# Patient Record
Sex: Male | Born: 1935 | Race: White | Hispanic: No | Marital: Married | Smoking: Former smoker
Health system: Southern US, Community
[De-identification: ages and names within clinical notes are randomized; demographics above are authoritative.]

## PROBLEM LIST (undated history)

## (undated) DIAGNOSIS — K219 Gastro-esophageal reflux disease without esophagitis: Secondary | ICD-10-CM

## (undated) DIAGNOSIS — I10 Essential (primary) hypertension: Secondary | ICD-10-CM

## (undated) DIAGNOSIS — M722 Plantar fascial fibromatosis: Secondary | ICD-10-CM

## (undated) DIAGNOSIS — M479 Spondylosis, unspecified: Secondary | ICD-10-CM

## (undated) DIAGNOSIS — R7989 Other specified abnormal findings of blood chemistry: Secondary | ICD-10-CM

## (undated) DIAGNOSIS — I350 Nonrheumatic aortic (valve) stenosis: Secondary | ICD-10-CM

## (undated) HISTORY — DX: Gastro-esophageal reflux disease without esophagitis: K21.9

## (undated) HISTORY — PX: TONSILLECTOMY: SHX5217

## (undated) HISTORY — PX: OTHER SURGICAL HISTORY: SHX169

## (undated) HISTORY — DX: Other specified abnormal findings of blood chemistry: R79.89

## (undated) HISTORY — DX: Nonrheumatic aortic (valve) stenosis: I35.0

## (undated) HISTORY — DX: Plantar fascial fibromatosis: M72.2

## (undated) HISTORY — DX: Essential (primary) hypertension: I10

## (undated) HISTORY — DX: Spondylosis, unspecified: M47.9

---

## 2001-04-28 ENCOUNTER — Encounter: Payer: Self-pay | Admitting: Internal Medicine

## 2001-04-28 ENCOUNTER — Ambulatory Visit (HOSPITAL_COMMUNITY): Admission: RE | Admit: 2001-04-28 | Discharge: 2001-04-28 | Payer: Self-pay | Admitting: Internal Medicine

## 2004-03-05 HISTORY — PX: CATARACT EXTRACTION: SUR2

## 2004-03-05 HISTORY — PX: BLEPHAROPLASTY: SUR158

## 2004-10-02 ENCOUNTER — Ambulatory Visit: Payer: Self-pay | Admitting: Internal Medicine

## 2004-10-03 ENCOUNTER — Ambulatory Visit: Payer: Self-pay | Admitting: Internal Medicine

## 2005-02-23 ENCOUNTER — Ambulatory Visit (HOSPITAL_COMMUNITY): Admission: RE | Admit: 2005-02-23 | Discharge: 2005-02-23 | Payer: Self-pay | Admitting: Orthopedic Surgery

## 2005-03-05 HISTORY — PX: ROTATOR CUFF REPAIR: SHX139

## 2005-03-28 ENCOUNTER — Encounter: Admission: RE | Admit: 2005-03-28 | Discharge: 2005-03-28 | Payer: Self-pay | Admitting: Orthopedic Surgery

## 2005-03-29 ENCOUNTER — Ambulatory Visit (HOSPITAL_BASED_OUTPATIENT_CLINIC_OR_DEPARTMENT_OTHER): Admission: RE | Admit: 2005-03-29 | Discharge: 2005-03-30 | Payer: Self-pay | Admitting: Orthopedic Surgery

## 2005-03-29 ENCOUNTER — Encounter (INDEPENDENT_AMBULATORY_CARE_PROVIDER_SITE_OTHER): Payer: Self-pay | Admitting: Specialist

## 2005-12-13 ENCOUNTER — Ambulatory Visit: Payer: Self-pay | Admitting: Internal Medicine

## 2006-04-27 IMAGING — CR DG CHEST 2V
2 series · 2 of 2 positions shown · non-contrast
Comparison: none

CLINICAL DATA: Pre-op for shoulder surgery impingement. 
 CHEST X-RAY: 
 Two views of the chest show the lungs to be clear and slightly hyperaerated.  The heart is within normal limits in size.  There is a thoracic scoliosis present.  The descending thoracic aorta is ectatic.

[view not recorded (1 of 2)]
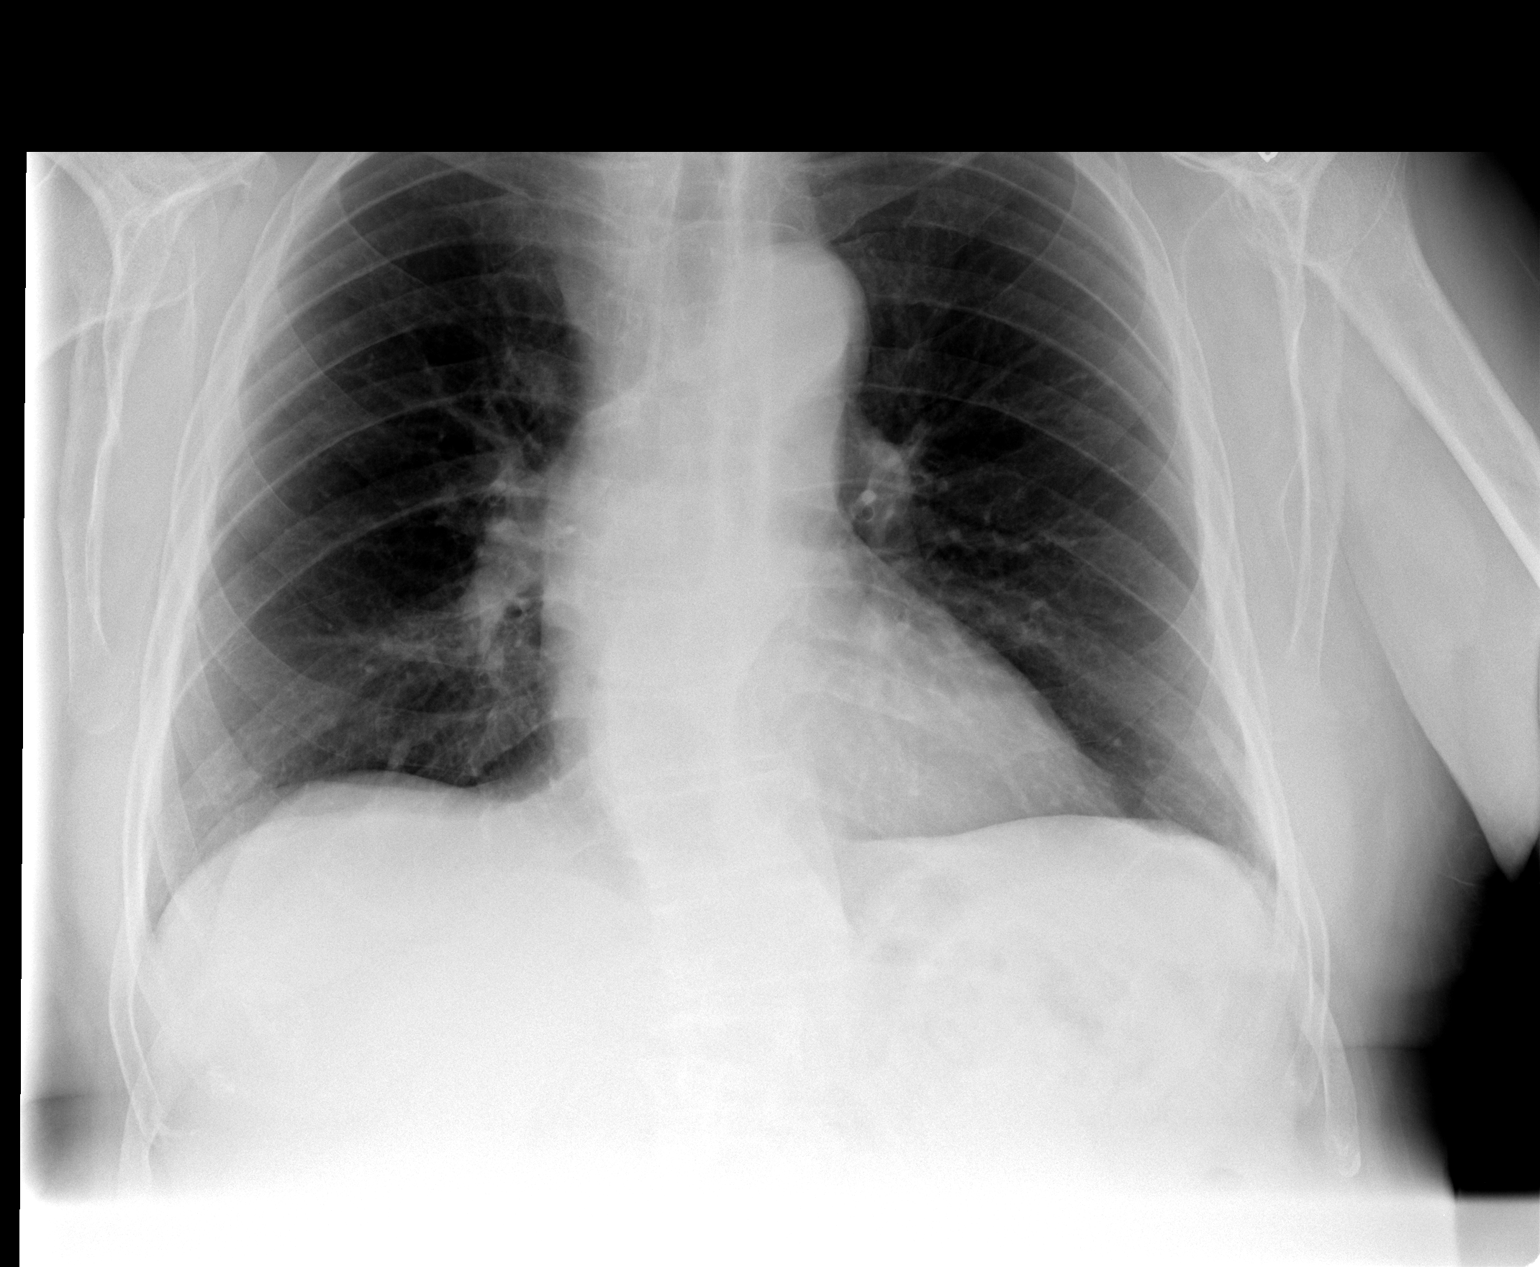

[view not recorded (2 of 2)]
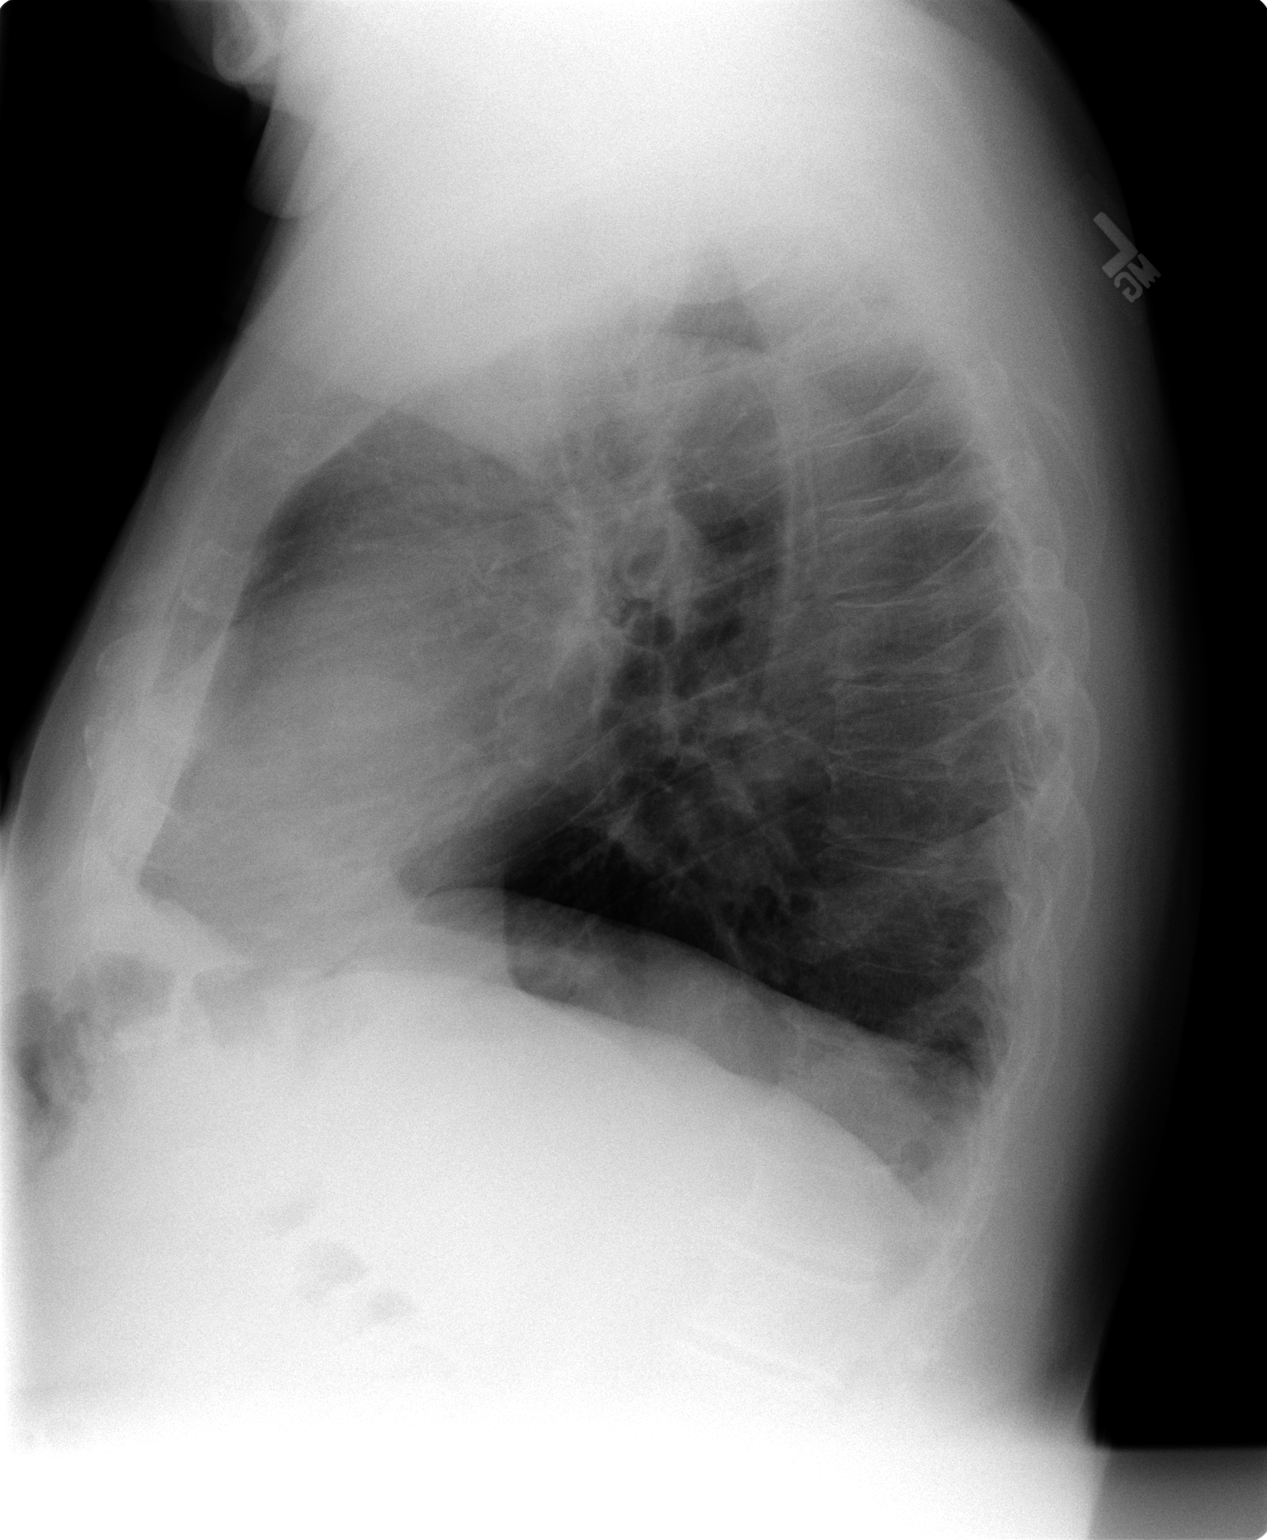

[2 of 2 positions shown; findings below may reference images not displayed]

IMPRESSION: No active lung disease.

## 2007-01-06 ENCOUNTER — Encounter: Payer: Self-pay | Admitting: Internal Medicine

## 2007-01-06 DIAGNOSIS — M479 Spondylosis, unspecified: Secondary | ICD-10-CM

## 2007-01-06 DIAGNOSIS — M722 Plantar fascial fibromatosis: Secondary | ICD-10-CM

## 2007-01-06 DIAGNOSIS — I10 Essential (primary) hypertension: Secondary | ICD-10-CM

## 2007-01-06 HISTORY — DX: Plantar fascial fibromatosis: M72.2

## 2007-01-06 HISTORY — DX: Spondylosis, unspecified: M47.9

## 2007-01-06 HISTORY — DX: Essential (primary) hypertension: I10

## 2007-02-03 ENCOUNTER — Ambulatory Visit: Payer: Self-pay | Admitting: Internal Medicine

## 2007-02-03 DIAGNOSIS — R7989 Other specified abnormal findings of blood chemistry: Secondary | ICD-10-CM | POA: Insufficient documentation

## 2007-02-03 HISTORY — DX: Other specified abnormal findings of blood chemistry: R79.89

## 2007-02-03 LAB — CONVERTED CEMR LAB
BUN: 21 mg/dL (ref 6–23)
CO2: 30 meq/L (ref 19–32)
Calcium: 9.6 mg/dL (ref 8.4–10.5)
Chloride: 103 meq/L (ref 96–112)
Cholesterol: 193 mg/dL (ref 0–200)
Creatinine, Ser: 0.9 mg/dL (ref 0.4–1.5)
GFR calc Af Amer: 105 mL/min
GFR calc non Af Amer: 87 mL/min
Glucose, Bld: 118 mg/dL — ABNORMAL HIGH (ref 70–99)
HDL: 32.9 mg/dL — ABNORMAL LOW (ref >39.0)
LDL Cholesterol: 142 mg/dL — ABNORMAL HIGH (ref 0–99)
PSA: 1.75 ng/mL (ref 0.10–4.00)
Potassium: 4.1 meq/L (ref 3.5–5.1)
Sodium: 141 meq/L (ref 135–145)
Total CHOL/HDL Ratio: 5.9
Triglycerides: 92 mg/dL (ref 0–149)
VLDL: 18 mg/dL (ref 0–40)

## 2007-02-04 ENCOUNTER — Encounter: Payer: Self-pay | Admitting: Internal Medicine

## 2007-02-05 ENCOUNTER — Encounter: Payer: Self-pay | Admitting: Internal Medicine

## 2008-03-11 ENCOUNTER — Ambulatory Visit: Payer: Self-pay | Admitting: Internal Medicine

## 2008-03-11 LAB — CONVERTED CEMR LAB
BUN: 22 mg/dL (ref 6–23)
Basophils Absolute: 0.1 10*3/uL (ref 0.0–0.1)
Basophils Relative: 1.4 % (ref 0.0–3.0)
CO2: 27 meq/L (ref 19–32)
Calcium: 9.8 mg/dL (ref 8.4–10.5)
Chloride: 99 meq/L (ref 96–112)
Creatinine, Ser: 1 mg/dL (ref 0.4–1.5)
Eosinophils Absolute: 0.2 10*3/uL (ref 0.0–0.7)
Eosinophils Relative: 2.9 % (ref 0.0–5.0)
GFR calc Af Amer: 92 mL/min
GFR calc non Af Amer: 76 mL/min
Glucose, Bld: 123 mg/dL — ABNORMAL HIGH (ref 70–99)
HCT: 44.3 % (ref 39.0–52.0)
Hemoglobin: 15.1 g/dL (ref 13.0–17.0)
Lymphocytes Relative: 17.8 % (ref 12.0–46.0)
MCHC: 34 g/dL (ref 30.0–36.0)
MCV: 93.9 fL (ref 78.0–100.0)
Monocytes Absolute: 0.5 10*3/uL (ref 0.1–1.0)
Monocytes Relative: 7.3 % (ref 3.0–12.0)
Neutro Abs: 4.9 10*3/uL (ref 1.4–7.7)
Neutrophils Relative %: 70.6 % (ref 43.0–77.0)
Platelets: 199 10*3/uL (ref 150–400)
Potassium: 3.9 meq/L (ref 3.5–5.1)
RBC: 4.72 M/uL (ref 4.22–5.81)
RDW: 12.4 % (ref 11.5–14.6)
Sodium: 138 meq/L (ref 135–145)
WBC: 6.9 10*3/uL (ref 4.5–10.5)

## 2008-03-16 ENCOUNTER — Ambulatory Visit: Payer: Self-pay | Admitting: Internal Medicine

## 2008-12-01 ENCOUNTER — Ambulatory Visit: Payer: Self-pay | Admitting: Internal Medicine

## 2009-02-21 ENCOUNTER — Telehealth: Payer: Self-pay | Admitting: Internal Medicine

## 2009-04-04 ENCOUNTER — Ambulatory Visit: Payer: Self-pay | Admitting: Internal Medicine

## 2009-04-04 LAB — CONVERTED CEMR LAB
BUN: 19 mg/dL (ref 6–23)
Basophils Absolute: 0 10*3/uL (ref 0.0–0.1)
Basophils Relative: 0 % (ref 0.0–3.0)
CO2: 30 meq/L (ref 19–32)
Calcium: 9.5 mg/dL (ref 8.4–10.5)
Chloride: 103 meq/L (ref 96–112)
Cholesterol: 173 mg/dL (ref 0–200)
Creatinine, Ser: 1 mg/dL (ref 0.4–1.5)
Eosinophils Absolute: 0.2 10*3/uL (ref 0.0–0.7)
Eosinophils Relative: 2.7 % (ref 0.0–5.0)
GFR calc non Af Amer: 76.11 mL/min (ref >60)
Glucose, Bld: 98 mg/dL (ref 70–99)
HCT: 43.5 % (ref 39.0–52.0)
HDL: 42.7 mg/dL (ref >39.00)
Hemoglobin: 14.4 g/dL (ref 13.0–17.0)
LDL Cholesterol: 115 mg/dL — ABNORMAL HIGH (ref 0–99)
Lymphocytes Relative: 15.9 % (ref 12.0–46.0)
Lymphs Abs: 1.4 10*3/uL (ref 0.7–4.0)
MCHC: 33 g/dL (ref 30.0–36.0)
MCV: 93.8 fL (ref 78.0–100.0)
Monocytes Absolute: 0.6 10*3/uL (ref 0.1–1.0)
Monocytes Relative: 6.5 % (ref 3.0–12.0)
Neutro Abs: 6.9 10*3/uL (ref 1.4–7.7)
Neutrophils Relative %: 74.9 % (ref 43.0–77.0)
Platelets: 200 10*3/uL (ref 150.0–400.0)
Potassium: 3.6 meq/L (ref 3.5–5.1)
RBC: 4.63 M/uL (ref 4.22–5.81)
RDW: 12.5 % (ref 11.5–14.6)
Sodium: 141 meq/L (ref 135–145)
TSH: 3.15 microintl units/mL (ref 0.35–5.50)
Total CHOL/HDL Ratio: 4
Triglycerides: 77 mg/dL (ref 0.0–149.0)
VLDL: 15.4 mg/dL (ref 0.0–40.0)
WBC: 9.1 10*3/uL (ref 4.5–10.5)

## 2009-05-11 ENCOUNTER — Telehealth: Payer: Self-pay | Admitting: Internal Medicine

## 2009-11-10 ENCOUNTER — Ambulatory Visit: Payer: Self-pay | Admitting: Internal Medicine

## 2010-04-06 NOTE — Assessment & Plan Note (Signed)
Summary: FLU SHOT/JSS   Nurse Visit   Vital Signs:  Patient profile:   77 year old male Temp:     97.4 degrees F oral  Vitals Entered By: Lanier Prude, CMA(AAMA) (November 10, 2009 10:56 AM)  Allergies: No Known Drug Allergies  Orders Added: 1)  Flu Vaccine 48yrs + MEDICARE PATIENTS [Q2039] 2)  Administration Flu vaccine - MCR [G0008] .lbmedflu   Flu Vaccine Consent Questions     Do you have a history of severe allergic reactions to this vaccine? no    Any prior history of allergic reactions to egg and/or gelatin? no    Do you have a sensitivity to the preservative Thimersol? no    Do you have a past history of Guillan-Barre Syndrome? no    Do you currently have an acute febrile illness? no    Have you ever had a severe reaction to latex? no    Vaccine information given and explained to patient? yes    Are you currently pregnant? no    Lot Number:[id number]   Exp Date:09/02/2010   Site Given  Left Deltoid IM Lanier Prude, [facility])  November 10, 2009 10:56 AM

## 2010-04-06 NOTE — Progress Notes (Signed)
Summary: LABS  Phone Note Call from Patient Call back at Home Phone [phone]   Caller: WIFE Summary of Call: PT WANTS A COPY OF RECENT LABS MAILED TO HIM. Initial call taken by: Hilarie Fredrickson,  May 11, 2009 12:03 PM  Follow-up for Phone Call        mailed pt labs from Jan. Follow-up by: Ami Bullins CMA,  May 12, 2009 9:05 AM

## 2010-04-06 NOTE — Assessment & Plan Note (Signed)
Summary: YEARLY FU/ LABS SAME DAY OR NEXT DAY/NWS   Vital Signs:  Patient profile:   77 year old male Height:      68 inches Weight:      189 pounds BMI:     28.84 O2 Sat:      97 % on Room air Temp:     97.3 degrees F oral Pulse rate:   88 / minute BP sitting:   188 / 90  (left arm) Cuff size:   large  Vitals Entered By: Bill Salinas CMA (April 04, 2009 2:23 PM)  O2 Flow:  Room air CC: pt here for yearly physical/ ab   Primary Care Provider:  Norins  CC:  pt here for yearly physical/ ab.  History of Present Illness: Patient presents for medical follow-up. He does report checking his BP at home - 135/80's consistently.   He consistently has arthritic pain. He sees no difference taking meloxicam. Will consider trying nabumetone 750 2 tabs at bedtime. He has been using darvocet, will change to tramadol.   He has no other c/o at this time.    Current Medications (verified): 1)  Norvasc 5 Mg  Tabs (Amlodipine Besylate) .... Once Daily 2)  Amiloride-Hydrochlorothiazide 5-50 Mg  Tabs (Amiloride-Hydrochlorothiazide) .... Once Daily 3)  Glucosamine 1500 Complex   Caps (Glucosamine-Chondroit-Vit C-Mn) 4)  Fish Oil 1000 Mg  Caps (Omega-3 Fatty Acids) 5)  Nabumetone 750 Mg Tabs (Nabumetone) .... 2 Tablets At Bedtime For Arthritic Pain. 6)  Gnp Omeprazole 20 Mg  Tbec (Omeprazole) .Marland Kitchen.. 1 Q Am As Needed 7)  Vitamin D 1000 Unit Tabs (Cholecalciferol) .... Take 1 Tablet By Mouth Once A Day 8)  Tramadol Hcl 50 Mg Tabs (Tramadol Hcl) .Marland Kitchen.. 1 or 2 Every 8 Hours As Needed For Uncontrolled Pain.  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 Keeps busy EWnd of Life Care: no heroic measure or life-support, i.e. DNR  Risk Factors: Caffeine Use: 1 (02/03/2007) Exercise: no (02/03/2007)  Risk Factors: Smoking Status: quit (01/06/2007) Passive Smoke Exposure: no (02/03/2007)  Review of Systems  The patient denies anorexia, fever, weight loss, weight gain, decreased hearing, hoarseness, chest pain, dyspnea on exertion, peripheral edema, headaches, abdominal pain, hematochezia, hematuria, incontinence, muscle weakness, difficulty walking, unusual weight change, abnormal bleeding, and angioedema.    Physical Exam  General:  alert, well-developed, well-nourished, and well-hydrated.   Head:  normocephalic, atraumatic, and no abnormalities observed.   Eyes:  vision grossly intact, pupils equal, and pupils round. Eyuelids starting to droop again  but not obscuring iris   Ears:  mild cerumne left. TMs normal Nose:  External nasal examination shows no deformity or inflammation. Nasal mucosa are pink and moist without lesions or exudates. Mouth:  upper denture, partial lower, no oral lesions Neck:  full ROM, no thyromegaly, and no carotid bruits.   Chest Wall:  no deformities, no tenderness, and no masses.   Lungs:  Normal respiratory effort, chest expands symmetrically. Lungs  are clear to auscultation, no crackles or wheezes. Heart:  Normal rate and regular rhythm. S1 and S2 normal without gallop, murmur, click, rub or other extra sounds. Abdomen:  soft, non-tender, normal bowel sounds, and no hepatomegaly.   Msk:  normal ROM, no joint tenderness, no joint swelling, no joint warmth, no redness over  joints, and no joint instability.   Pulses:  2+ radial and DP pulses Extremities:  No clubbing, cyanosis, edema, or deformity noted with normal full range of motion of all joints.   Neurologic:  No cranial nerve deficits noted. Station and gait are normal. Plantar reflexes are down-going bilaterally. DTRs are symmetrical throughout. Sensory, motor and coordinative functions appear intact. Skin:  turgor normal, color normal, no rashes, and no ulcerations.   Cervical Nodes:  no anterior cervical adenopathy and no posterior cervical adenopathy.   Axillary Nodes:  no R axillary adenopathy.   Psych:  Oriented X3, memory intact for recent and remote, normally interactive, and not anxious appearing.     Impression & Recommendations:  Problem # 1:  HYPERTENSION (ICD-401.9)  His updated medication list for this problem includes:    Norvasc 5 Mg Tabs (Amlodipine besylate) ..... Once daily    Amiloride-hydrochlorothiazide 5-50 Mg Tabs (Amiloride-hydrochlorothiazide) ..... Once daily  Orders: TLB-BMP (Basic Metabolic Panel-BMET) (80048-METABOL)  BP today: 188/90 Prior BP: 142/72 (03/11/2008)  Blood pressure elevated today. He is asymptomatic. He reports checking BP at home routinely with readings in the 135/80 range.  Plan - patient is to report back additional BP readings. 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  Orders: TLB-CBC Platelet - w/Differential (85025-CBCD)  Problem # 3:  Preventive Health Care (ICD-V70.0) unremarkable history and a normal exam. He is current with colorectal cancer screening with last exam in 'o5 and at 80 with no symptoms no further screening is necessary. He has had flu vaccine. He is a candidate for pneumovax and  zostavax. Lab results are normal including an LDL cholesterolof 115.  In summary - a nice man who appears to be medically stable. He will return as needed. He will report back on his BP readings.   Complete Medication List: 1)  Norvasc 5 Mg Tabs (Amlodipine besylate) .... Once daily 2)  Amiloride-hydrochlorothiazide 5-50 Mg Tabs (Amiloride-hydrochlorothiazide) .... Once daily 3)  Glucosamine 1500 Complex Caps (Glucosamine-chondroit-vit c-mn) 4)  Fish Oil 1000 Mg Caps (Omega-3 fatty acids) 5)  Nabumetone 750 Mg Tabs (Nabumetone) .... 2 tablets at bedtime for arthritic pain. 6)  Gnp Omeprazole 20 Mg Tbec (Omeprazole) .Marland Kitchen.. 1 q am as needed 7)  Vitamin D 1000 Unit Tabs (Cholecalciferol) .... Take 1 tablet by mouth once a day 8)  Tramadol Hcl 50 Mg Tabs (Tramadol hcl) .Marland Kitchen.. 1 or 2 every 8 hours as needed for uncontrolled pain.  Other Orders: Prescription Created Electronically [id number]) Prescription Created Electronically [phone]) TLB-Lipid Panel (80061-LIPID) TLB-TSH (Thyroid Stimulating Hormone) (96295-MWU)   Patient: Christopher Tran Note: All result statuses are Final unless otherwise noted.  Tests: (1) BMP (METABOL)   Sodium                    141 mEq/L  135-145   Potassium                 3.6 mEq/L                   3.5-5.1   Chloride                  103 mEq/L                   96-112   Carbon Dioxide            30 mEq/L                    19-32   Glucose                   98 mg/dL                    54-09   BUN                       19 mg/dL                    8-11   Creatinine                1.0 mg/dL                   9.1-4.7   Calcium                   9.5 mg/dL                   8.2-95.6   GFR                       76.11 mL/min                >60  Tests: (2) CBC Platelet w/Diff (CBCD)   White Cell Count          9.1 K/uL                    4.5-10.5   Red Cell Count            4.63 Mil/uL                 4.22-5.81   Hemoglobin                14.4 g/dL                    21.3-08.6   Hematocrit                43.5 %                      39.0-52.0   MCV                       93.8 fl                     78.0-100.0   MCHC                      33.0 g/dL                   57.8-46.9   RDW  12.5 %                      11.5-14.6   Platelet Count            200.0 K/uL                  150.0-400.0   Neutrophil %              74.9 %                      43.0-77.0   Lymphocyte %              15.9 %                      12.0-46.0   Monocyte %                6.5 %                       3.0-12.0   Eosinophils%              2.7 %                       0.0-5.0   Basophils %               0.0 %                       0.0-3.0   Neutrophill Absolute      6.9 K/uL                    1.4-7.7   Lymphocyte Absolute       1.4 K/uL                    0.7-4.0   Monocyte Absolute         0.6 K/uL                    0.1-1.0  Eosinophils, Absolute                             0.2 K/uL                    0.0-0.7   Basophils Absolute        0.0 K/uL                    0.0-0.1  Tests: (3) Lipid Panel (LIPID)   Cholesterol               173 mg/dL                   1-660     ATP III Classification            Desirable:  < 200 mg/dL                    Borderline High:  200 - 239 mg/dL               High:  > = 240 mg/dL   Triglycerides             77.0 mg/dL                  6.3-016.0  Normal:  <150 mg/dL     Borderline High:  045 - 199 mg/dL   HDL                       40.98 mg/dL                 >11.91   VLDL Cholesterol          15.4 mg/dL                  4.7-82.9   LDL Cholesterol      [H]  562 mg/dL                   1-30  CHO/HDL Ratio:  CHD Risk                             4                    Men          Women     1/2 Average Risk     3.4          3.3     Average Risk          5.0          4.4     2X Average Risk          9.6          7.1     3X Average Risk          15.0          11.0                           Tests: (4) TSH (TSH)   FastTSH                    3.15 uIU/mL                 0.35-5.50Prescriptions: TRAMADOL HCL 50 MG TABS (TRAMADOL HCL) 1 or 2 every 8 hours as needed for uncontrolled pain.  #270 x 3   Entered and Authorized by:   Jacques Navy MD   Signed by:   Jacques Navy MD on 04/04/2009   Method used:   Faxed to ...       [facility] (mail-order)       [address]       [place], [place]  [id number]       Ph: [phone]       Fax: [phone]   RxID:   [id number] GNP OMEPRAZOLE 20 MG  TBEC (OMEPRAZOLE) 1 q AM as needed  #90 x 3   Entered and Authorized by:   Jacques Navy MD   Signed by:   Jacques Navy MD on 04/04/2009   Method used:   Faxed to ...       [facility] (mail-order)       [address]       [place], [place]  [id number]       Ph: [phone]       Fax: [phone]   RxID:   [id number] NABUMETONE 750 MG TABS (NABUMETONE) 2 tablets at bedtime for arthritic pain.  #180 x 3   Entered and Authorized by:   Jacques Navy MD  Signed by:   Jacques Navy MD on 04/04/2009   Method used:   Faxed to ...       [facility] (mail-order)       [address]       [place], [place]  [id number]       Ph: [phone]       Fax: [phone]   RxID:   [id number] AMILORIDE-HYDROCHLOROTHIAZIDE 5-50 MG  TABS (AMILORIDE-HYDROCHLOROTHIAZIDE) once daily  #90 x 3   Entered and Authorized by:   Jacques Navy MD   Signed by:   Jacques Navy MD on 04/04/2009   Method used:   Faxed to ...       [facility] (mail-order)       [address]       [place], [place]  [id number]       Ph: [phone]       Fax: [phone]   RxID:   [id number] NORVASC 5 MG  TABS (AMLODIPINE BESYLATE) once daily  #90 x 3   Entered and Authorized by:   Jacques Navy MD   Signed by:   Jacques Navy MD on 04/04/2009   Method used:   Faxed to ...       [facility] (mail-order)       [address]        [place], [place]  [id number]       Ph: [phone]       Fax: [phone]   RxID:   [id number] GNP OMEPRAZOLE 20 MG  TBEC (OMEPRAZOLE) 1 q AM as needed  #90 x 3   Entered and Authorized by:   Jacques Navy MD   Signed by:   Jacques Navy MD on 04/04/2009   Method used:   Print then Give to Patient   RxID:   [id number] AMILORIDE-HYDROCHLOROTHIAZIDE 5-50 MG  TABS (AMILORIDE-HYDROCHLOROTHIAZIDE) once daily  #90 x 3   Entered and Authorized by:   Jacques Navy MD   Signed by:   Jacques Navy MD on 04/04/2009   Method used:   Print then Give to Patient   RxID:   [id number] NORVASC 5 MG  TABS (AMLODIPINE BESYLATE) once daily  #90 x 3   Entered and Authorized by:   Jacques Navy MD   Signed by:   Jacques Navy MD on 04/04/2009   Method used:   Print then Give to Patient   RxID:   [id number] NABUMETONE 750 MG TABS (NABUMETONE) 2 tablets at bedtime for arthritic pain.  #180 x 3   Entered and Authorized by:   Jacques Navy MD   Signed by:   Jacques Navy MD on 04/04/2009   Method used:   Print then Give to Patient   RxID:   [id number]

## 2010-05-17 ENCOUNTER — Other Ambulatory Visit: Payer: Self-pay | Admitting: Internal Medicine

## 2010-05-17 ENCOUNTER — Encounter: Payer: Self-pay | Admitting: Internal Medicine

## 2010-05-17 ENCOUNTER — Ambulatory Visit (INDEPENDENT_AMBULATORY_CARE_PROVIDER_SITE_OTHER): Payer: Medicare Other | Admitting: Internal Medicine

## 2010-05-17 ENCOUNTER — Other Ambulatory Visit: Payer: Medicare Other

## 2010-05-17 DIAGNOSIS — I10 Essential (primary) hypertension: Secondary | ICD-10-CM

## 2010-05-17 DIAGNOSIS — M479 Spondylosis, unspecified: Secondary | ICD-10-CM

## 2010-05-17 DIAGNOSIS — Z0000 Encounter for general adult medical examination without abnormal findings: Secondary | ICD-10-CM

## 2010-05-17 DIAGNOSIS — R7989 Other specified abnormal findings of blood chemistry: Secondary | ICD-10-CM

## 2010-05-17 DIAGNOSIS — Z136 Encounter for screening for cardiovascular disorders: Secondary | ICD-10-CM

## 2010-05-17 LAB — BASIC METABOLIC PANEL
BUN: 19 mg/dL (ref 6–23)
CO2: 28 mEq/L (ref 19–32)
Calcium: 9.3 mg/dL (ref 8.4–10.5)
Chloride: 99 mEq/L (ref 96–112)
Creatinine, Ser: 1 mg/dL (ref 0.4–1.5)
GFR: 77.69 mL/min (ref >60.00)
Glucose, Bld: 113 mg/dL — ABNORMAL HIGH (ref 70–99)
Potassium: 3.6 mEq/L (ref 3.5–5.1)
Sodium: 137 mEq/L (ref 135–145)

## 2010-05-17 LAB — CBC WITH DIFFERENTIAL/PLATELET
Basophils Absolute: 0 10*3/uL (ref 0.0–0.1)
Basophils Relative: 0.5 % (ref 0.0–3.0)
Eosinophils Absolute: 0.2 10*3/uL (ref 0.0–0.7)
Eosinophils Relative: 2 % (ref 0.0–5.0)
HCT: 42.3 % (ref 39.0–52.0)
Hemoglobin: 14.6 g/dL (ref 13.0–17.0)
Lymphocytes Relative: 11.3 % — ABNORMAL LOW (ref 12.0–46.0)
Lymphs Abs: 1 10*3/uL (ref 0.7–4.0)
MCHC: 34.4 g/dL (ref 30.0–36.0)
MCV: 92.9 fl (ref 78.0–100.0)
Monocytes Absolute: 0.6 10*3/uL (ref 0.1–1.0)
Monocytes Relative: 6.3 % (ref 3.0–12.0)
Neutro Abs: 7.1 10*3/uL (ref 1.4–7.7)
Neutrophils Relative %: 79.9 % — ABNORMAL HIGH (ref 43.0–77.0)
Platelets: 235 10*3/uL (ref 150.0–400.0)
RBC: 4.55 Mil/uL (ref 4.22–5.81)
RDW: 13.1 % (ref 11.5–14.6)
WBC: 8.9 10*3/uL (ref 4.5–10.5)

## 2010-05-17 LAB — HEPATIC FUNCTION PANEL
ALT: 17 U/L (ref 0–53)
Bilirubin, Direct: 0.1 mg/dL (ref 0.0–0.3)
Total Bilirubin: 1.1 mg/dL (ref 0.3–1.2)

## 2010-05-17 LAB — LIPID PANEL
HDL: 32.1 mg/dL — ABNORMAL LOW (ref >39.00)
LDL Cholesterol: 146 mg/dL — ABNORMAL HIGH (ref 0–99)
VLDL: 19 mg/dL (ref 0.0–40.0)

## 2010-05-17 LAB — TSH: TSH: 1.46 u[IU]/mL (ref 0.35–5.50)

## 2010-05-19 ENCOUNTER — Encounter: Payer: Self-pay | Admitting: Internal Medicine

## 2010-05-23 NOTE — Assessment & Plan Note (Signed)
Summary: PER [facility] OK TO USE FOR 30 MIN YEARLY-RS BUMP PHONE  -PT REQUES...   Vital Signs:  Patient profile:   77 year old male Height:      68 inches Weight:      182 pounds BMI:     27.77 O2 Sat:      97 % Temp:     97.9 degrees F oral Pulse rate:   60 / minute BP sitting:   148 / 82  (left arm) Cuff size:   large  Vitals Entered By: Bill Salinas CMA (May 17, 2010 11:17 AM) CC: yearly/ ab  Vision Screening:      Vision Comments: Last eye exam Nov 2011 with no change in eyes   Primary Care Provider:  Everitt Tran  CC:  yearly/ ab.  History of Present Illness: Mr. Christopher Tran presents for annual medicare wellness exam. He has been doing well. He can tell he is aging: a little slower and a little less energy. He has noted some decrease force of stream. He does report that taking two nabumetone caused diarrhea so he switched to one a day.   He is 100% independent in all ADLs. He has had no falls and has no increased risk. He is still working: yard work, gardening, "honey-dos", managing all his financial affairs. He has no sign of depression.   Preventive Screening-Counseling & Management  Alcohol-Tobacco     Alcohol drinks/day: 0     Smoking Status: quit     Year Quit: 1958     Pack years: 30  Caffeine-Diet-Exercise     Caffeine use/day: 1 cup per day     Diet Comments: healthy diet     Does Patient Exercise: no  Hep-HIV-STD-Contraception     Hepatitis Risk: no risk noted     HIV Risk: no risk noted     STD Risk: no risk noted     Dental Visit-last 6 months yes     TSE monthly: no     Sun Exposure-Excessive: no  Safety-Violence-Falls     Seat Belt Use: yes     Helmet Use: n/a     Firearms in the Home: firearms in the home     Smoke Detectors: yes     Violence in the Home: no risk noted     Sexual Abuse: no     Fall Risk: slight fall risk      Sexual History:  currently monogamous.        Drug Use:  never.        Blood Transfusions:  no.    Current Medications  (verified): 1)  Norvasc 5 Mg  Tabs (Amlodipine Besylate) .... Once Daily 2)  Amiloride-Hydrochlorothiazide 5-50 Mg  Tabs (Amiloride-Hydrochlorothiazide) .... Once Daily 3)  Glucosamine 1500 Complex   Caps (Glucosamine-Chondroit-Vit C-Mn) 4)  Fish Oil 1000 Mg  Caps (Omega-3 Fatty Acids) 5)  Nabumetone 750 Mg Tabs (Nabumetone) .Marland Kitchen.. 1 Tablet Daily For Arthritic Pain. 6)  Gnp Omeprazole 20 Mg  Tbec (Omeprazole) .Marland Kitchen.. 1 Q Am As Needed 7)  Vitamin D 1000 Unit Tabs (Cholecalciferol) .... Take 1 Tablet By Mouth Once A Day 8)  Tramadol Hcl 50 Mg Tabs (Tramadol Hcl) .Marland Kitchen.. 1 or 2 Every 8 Hours As Needed For Uncontrolled Pain.  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 Keeps busy EWnd of Life Care: no heroic measure or life-support, i.e. DNR  Social History: Caffeine use/day:  1 cup per day Dental Care w/in 6 mos.:  yes Sun Exposure-Excessive:  no Seat Belt Use:  yes Fall Risk:  slight fall risk Hepatitis Risk:  no risk noted HIV Risk:  no risk noted STD Risk:  no risk noted Sexual History:  currently monogamous Drug Use:  never Blood Transfusions:  no  Review of Systems  The patient denies anorexia, fever, weight loss, weight gain, vision loss, decreased hearing, hoarseness, chest pain, syncope, dyspnea on exertion,  peripheral edema, prolonged cough, abdominal pain, severe indigestion/heartburn, incontinence, muscle weakness, suspicious skin lesions, depression, abnormal bleeding, and enlarged lymph nodes.         no nocturia  Physical Exam  General:  older, mildly overweight white male in no distress Head:  normocephalic, atraumatic, and no abnormalities observed.   Eyes:  arcus senilis bilaterally. pupils equal, pupils round, and pupils reactive to light.   Ears:  External ear exam shows no significant lesions or deformities.  Otoscopic examination reveals clear canals, tympanic membranes are intact bilaterally without bulging, retraction, inflammation or discharge. Hearing is grossly normal bilaterally. Nose:  no external deformity and no external erythema.   Mouth:  full upper dentiure, lower partial. No oral lesions. Posterior pharynx clear. Neck:  supple, full ROM, no thyromegaly, and no carotid bruits.   Chest Wall:  increased A-P diameter, No CVAT. Breasts:  mild gynecomastia Lungs:  normal respiratory effort, normal breath sounds, no crackles, and no wheezes.   Heart:  normal rate and regular rhythm.  II/VI systolic murmur RSB.  II/VI systolic murmur at apex. No JVD Abdomen:  soft, non-tender, normal bowel sounds, no guarding, and no hepatomegaly.   Prostate:  deferred to age Msk:  normal ROM, no joint tenderness, no joint swelling, no joint warmth, and no joint deformities.   Pulses:  2+ radial and PT pulses Extremities:  No clubbing, cyanosis, edema, or deformity noted with normal full range of motion of all joints.   Neurologic:  alert & oriented X3, cranial nerves II-XII intact, strength normal in all extremities, sensation intact to light touch, gait normal, and DTRs symmetrical and normal.   Skin:  turgor normal, color normal, no suspicious lesions, and no ulcerations.  Large tan mole right eyebrow. Large tan mole right back at the scapula. Cervical Nodes:  no anterior cervical adenopathy  and no posterior cervical adenopathy.   Psych:  Oriented X3, memory intact for recent and remote, normally interactive, and good eye contact.     Impression & Recommendations:  Problem # 1:  HYPERTENSION (ICD-401.9)  His updated medication list for this problem includes:    Norvasc 5 Mg Tabs (Amlodipine besylate) ..... Once daily    Amiloride-hydrochlorothiazide 5-50 Mg Tabs (Amiloride-hydrochlorothiazide) ..... Once daily  Orders: TLB-BMP (Basic Metabolic Panel-BMET) (80048-METABOL)  BP today: 148/82 Prior BP: 188/90 (04/04/2009)  Fair control. 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   Plan - OK to take a drug  holiday from nabumetone. If he notices no difference off the medication   Orders: TLB-CBC Platelet - w/Differential (85025-CBCD)  Problem # 3:  Preventive Health Care (ICD-V70.0)  INterval history is unremarkable. Physical exam notable for weight issue otherise OK.  Current with colorectal cancer screeing with normal study in '05 and ot a candidate for further exams. Immunizations: tetnus booster today; will review old chart re": pneumonia vaccine; patient asked to check on inusrance ocverage for shingles vaccine. 12 lead EKG negative forinjury or ischemia. Lab    In summary - a very pleasent man who appears to be medically stable. He is advised to try a drug holiday from [facility]. He will return as needed or in 1 year.   Orders: Medicare -1st Annual Wellness Visit [phone])  Complete Medication List: 1)  Norvasc 5 Mg Tabs (Amlodipine besylate) .... Once daily 2)  Amiloride-hydrochlorothiazide 5-50 Mg Tabs (Amiloride-hydrochlorothiazide) .... Once daily 3)  Glucosamine 1500 Complex Caps (Glucosamine-chondroit-vit c-mn) 4)  Fish Oil 1000 Mg Caps (Omega-3 fatty acids) 5)  Nabumetone 750 Mg Tabs (Nabumetone) .Marland Kitchen.. 1 tablet daily for  arthritic pain. 6)  Gnp Omeprazole 20 Mg Tbec (Omeprazole) .Marland Kitchen.. 1 q am as needed 7)  Vitamin D 1000 Unit Tabs (Cholecalciferol) .... Take 1 tablet by mouth once a day 8)  Tramadol Hcl 50 Mg Tabs (Tramadol hcl) .Marland Kitchen.. 1 or 2 every 8 hours as needed for uncontrolled pain.  Other Orders: TLB-Lipid Panel (80061-LIPID) TLB-Hepatic/Liver Function Pnl (80076-HEPATIC) TLB-TSH (Thyroid Stimulating Hormone) (84443-TSH) EKG w/ Interpretation (93000)  Patient: Christopher Tran Note: All result statuses are Final unless otherwise noted.  Tests: (1) BMP (METABOL)   Sodium                    137 mEq/L                   135-145   Potassium                 3.6 mEq/L                   3.5-5.1   Chloride                  99 mEq/L                    96-112   Carbon Dioxide            28 mEq/L                    19-32   Glucose              [H]  113 mg/dL                   30-86   BUN                       19 mg/dL                    5-78   Creatinine                1.0 mg/dL                   4.6-9.6   Calcium                   9.3 mg/dL  8.4-10.5   GFR                       77.69 mL/min                >60.00  Tests: (2) CBC Platelet w/Diff (CBCD)   White Cell Count          8.9 K/uL                    4.5-10.5   Red Cell Count            4.55 Mil/uL                 4.22-5.81   Hemoglobin                14.6 g/dL                   16.1-09.6   Hematocrit                42.3 %                      39.0-52.0   MCV                       92.9 fl                     78.0-100.0   MCHC                      34.4 g/dL                   04.5-40.9   RDW                       13.1 %                      11.5-14.6   Platelet Count            235.0 K/uL                  150.0-400.0   Neutrophil %         [H]  79.9 %                      43.0-77.0   Lymphocyte %         [L]  11.3 %                      12.0-46.0   Monocyte %                6.3 %                       3.0-12.0   Eosinophils%               2.0 %                       0.0-5.0   Basophils %               0.5 %                       0.0-3.0   Neutrophill Absolute      7.1 K/uL  1.4-7.7   Lymphocyte Absolute       1.0 K/uL                    0.7-4.0   Monocyte Absolute         0.6 K/uL                    0.1-1.0  Eosinophils, Absolute                             0.2 K/uL                    0.0-0.7   Basophils Absolute        0.0 K/uL                    0.0-0.1  Tests: (3) Lipid Panel (LIPID)   Cholesterol               197 mg/dL                   5-784     ATP III Classification            Desirable:  < 200 mg/dL                    Borderline High:  200 - 239 mg/dL               High:  > = 240 mg/dL   Triglycerides             95.0 mg/dL                  6.9-629.5     Normal:  <150 mg/dL     Borderline High:  284 - 199 mg/dL   HDL                  [L]  13.24 mg/dL                 >40.10   VLDL Cholesterol          19.0 mg/dL                  2.7-25.3   LDL Cholesterol      [H]  664 mg/dL                   4-03  CHO/HDL Ratio:  CHD Risk                             6                    Men          Women     1/2 Average Risk     3.4          3.3     Average Risk          5.0          4.4     2X Average Risk          9.6          7.1     3X Average Risk          15.0          11.0  Tests: (4) Hepatic/Liver Function Panel (HEPATIC)   Total Bilirubin           1.1 mg/dL                   0.4-5.4   Direct Bilirubin          0.1 mg/dL                   0.9-8.1   Alkaline Phosphatase      70 U/L                      39-117   AST                       16 U/L                      0-37   ALT                       17 U/L                      0-53   Total Protein             7.0 g/dL                    1.9-1.4   Albumin                   4.5 g/dL                    7.8-2.9  Tests: (5) TSH (TSH)   FastTSH                   1.46 uIU/mL                 0.35-5.50Prescriptions: TRAMADOL  HCL 50 MG TABS (TRAMADOL HCL) 1 or 2 every 8 hours as needed for uncontrolled pain.  #270 x 3   Entered and Authorized by:   Jacques Navy MD   Signed by:   Jacques Navy MD on 05/17/2010   Method used:   Faxed to ...       [facility] (mail-order)       [address]       [place], [place]  [id number]       Ph: [phone]       Fax: [phone]   RxID:   [id number] GNP OMEPRAZOLE 20 MG  TBEC (OMEPRAZOLE) 1 q AM as needed  #90 x 3   Entered and Authorized by:   Jacques Navy MD   Signed by:   Jacques Navy MD on 05/17/2010   Method used:   Faxed to ...       [facility] (mail-order)       [address]       [place], [place]  [id number]       Ph: [phone]       Fax: [phone]   RxID:   [id number] NABUMETONE 750 MG TABS (NABUMETONE) 1 tablet daily for arthritic pain.  #90 x 3   Entered and Authorized by:   Jacques Navy MD   Signed by:   Jacques Navy MD on 05/17/2010   Method used:   Faxed to ...       [facility] (mail-order)  [address]       [place], [place]  [id number]       Ph: [phone]       Fax: [phone]   RxID:   [id number] AMILORIDE-HYDROCHLOROTHIAZIDE 5-50 MG  TABS (AMILORIDE-HYDROCHLOROTHIAZIDE) once daily  #90 Tablet x 2   Entered and Authorized by:   Jacques Navy MD   Signed by:   Jacques Navy MD on 05/17/2010   Method used:   Faxed to ...       [facility] (mail-order)       [address]       [place], [place]  [id number]       Ph: [phone]       Fax: [phone]   RxID:   [id number] NORVASC 5 MG  TABS (AMLODIPINE BESYLATE) once daily  #90 x 3   Entered and Authorized by:   Jacques Navy MD   Signed by:   Jacques Navy MD on 05/17/2010   Method used:   Faxed to ...       [facility] (mail-order)       [address]       [place], [place]  [id number]       Ph: [phone]       Fax: [phone]   RxID:    [id number]    Orders Added: 1)  TLB-BMP (Basic Metabolic Panel-BMET) [80048-METABOL] 2)  TLB-CBC Platelet - w/Differential [85025-CBCD] 3)  TLB-Lipid Panel [80061-LIPID] 4)  TLB-Hepatic/Liver Function Pnl [80076-HEPATIC] 5)  TLB-TSH (Thyroid Stimulating Hormone) [84443-TSH] 6)  Medicare -1st Annual Wellness Visit [G0438] 7)  Est. Patient Level II [62694] 8)  EKG w/ Interpretation [93000]

## 2010-07-21 NOTE — Assessment & Plan Note (Signed)
[facility]                             PRIMARY CARE OFFICE NOTE   NAME:WILLETTGunner, Iodice                    MRN:          [id number]  DATE:12/13/2005                            DOB:          1925-05-24    PATIENT IDENTIFICATION:  Mr. Christopher Tran is a pleasant 77 year old gentleman who  presents for followup evaluation and exam.  He was last seen October 02, 2004.   CHIEF COMPLAINT:  Indigestion.   INTERVAL HISTORY:  The patient unfortunately has had occasional indigestion  but this is readily relieved with a dose of Prevacid which he gets from his  wife.  He otherwise has no medical complaints.  Interval history includes  repair of a right rotator cuff in January 2007 with the patient having  worked his way through physical therapy but still has some limitation in  motion and discomfort.   The patient with history of cataract surgery in both eyes and this was in  late 2006.   PAST MEDICAL HISTORY:   SURGICAL:  1. Tonsillectomy remote.  2. Plantar fasciitis release in the past.   MEDICAL:  1. Usual childhood diseases.  2. Hypertension.  3. DJD back and shoulders.  4. Remote jaundice.   CURRENT MEDICATIONS:  1. Norvasc 5 mg daily.  2. Amiloride/hydrochlorothiazide 5/50 once daily.  3. Glucosamine as MoveFree for arthritis.  4. Fish oil.  5. Prevacid p.r.n.   FAMILY HISTORY AND SOCIAL HISTORY:  Well documented.  The patient's marriage  remains in good health.  He is retired.  He keeps himself very active,  including golf.   REVIEW OF SYSTEMS:  The patient has had a 4-pound weight loss, no other  constitutional problems.  It has been more than 24 months since his last eye  exam.  No cardiovascular complaints.  Respiratory significant for mild  dyspnea on exertion.  He had a normal chest x-ray in 2005.  GI with  occasional heartburn relieved with proton pump inhibitor as noted.  GU  negative with no nocturia.  He does have occasional  urgency.  Musculoskeletal significant for mild discomfort and arthritis affecting  multiple joints.  He reports that OTC anti-inflammatories have been less  than helpful and he just does better with glucosamine.  No dermatologic or  neurologic complaints or problems.   EXAMINATION:  VITAL SIGNS:  Temperature was 97.8, blood pressure 164/82,  pulse was 60, rate 184.  GENERAL APPEARANCE:  A heavyset Caucasian male who looks his stated age.  He  was in no acute distress.  HEENT EXAM:  Normocephalic, atraumatic.  EACs and TMs were unremarkable.  Oropharynx with upper denture/lower partial.  No buccal lesions were noted.  Posterior pharynx was clear.  Conjunctivae and sclerae were clear.  PERRLA.  EOMI.  Funduscopic exam is unremarkable.  NECK:  Supple without thyromegaly.  NODES:  No adenopathy was noted in the cervical or supraclavicular regions.  CHEST:  No CVA tenderness.  Lungs were clear to auscultation and percussion.  CARDIOVASCULAR:  2+ radial pulse.  No JVD or carotid bruits.  He had a quiet  precordium with regular rate  and rhythm without murmurs, rubs or gallops.  ABDOMEN:  Soft, no guarding, no rebound, no organosplenomegaly was  appreciated.  GENITALIA:  Normal male phallus.  Bilaterally descended testicles.  RECTAL EXAM:  Normal sphincter tone was noted.  Prostate was unremarkable  but generous at 3+.  No nodules as noted.  EXTREMITIES:  Without clubbing or cyanosis.  No edema, no deformities, no  erythema, no synovial thickening was noted.  NEUROLOGIC EXAM:  The patient is awake, alert, oriented to person, place,  time and context.  Cranial nerves were intact.  DTRs were 2+ and symmetrical  at the patellar and biceps tendons.  Motor strength was normal.  Cerebellar  function:  The patient is noted to have a resting tremor, low frequency,  does not seem to have intention tremor.  SKIN:  Clear.   LABORATORY:  Laboratory ordered and pending includes a lipid panel since the   patient had a borderline LDL of 134 in the past.  Basic metabolic panel is  ordered for monitoring of potassium, BUN and creatinine.  PSA is ordered.   ASSESSMENT AND PLAN:  1. Hypertension.  The patient's blood pressure is elevated at today's      visit with a systolic of 164.  He was 160 at his last visit, 130/90 in      2005.  Plan:  The patient to monitor his blood pressure at home.  If he      runs systolic greater than 140, he needs to notify me so I may adjust      and modify his medications.  2. Gastrointestinal.  The patient with mild indigestion relieved with      proton pump inhibitor.  I have also suggested the use of over-the-      counter Prilosec or possibly an H2 blocker.  3. Health maintenance.  The patient did have a colonoscopy June 2005,      doing well, it was recommended he follow up in 4 years.  4. The patient is given pneumonia vaccine at today's visit.  The patient      has had flu immunization this year.  Discussed Zostavax which he defers      at this time.   In summary, this is a very pleasant gentleman who seems to be medically  stable at this time.            ______________________________  Rosalyn Gess Norins, MD      MEN/MedQ  DD:  12/13/2005  DT:  12/15/2005  Job #:  [id number]

## 2010-07-21 NOTE — Op Note (Signed)
NAME:  Christopher Tran, Christopher Tran             ACCOUNT NO.:  [id number]   MEDICAL RECORD NO.:  [id number]          PATIENT TYPE:  AMB   LOCATION:  DSC                          FACILITY:  [facility]   PHYSICIAN:  Katy Fitch. Sypher, M.D. DATE OF BIRTH:  Jul 28, 1925   DATE OF PROCEDURE:  03/29/2005  DATE OF DISCHARGE:                                 OPERATIVE REPORT   PREOP DIAGNOSIS:  Chronic right rotator cuff avulsion with 99% disruption of  biceps tendon due to degenerative impingement tendinopathy with extensive  labral degeneration and chronic stage III impingement right shoulder.   POSTOP DIAGNOSIS:  Chronic right rotator cuff avulsion with 99% disruption  of biceps tendon due to degenerative impingement tendinopathy with extensive  labral degeneration and chronic stage III impingement right shoulder.   OPERATION:  1.  Diagnostic arthroscopy, right glenohumeral joint with extensive labral      debridement and removal of adhesive capsulitis, granulation tissue as      well as deep surface rotator cuff and biceps tendinopathy debridement.  2.  Reconstruction of right rotator cuff including repair of supraspinatus,      infraspinatus and teres minor tendons due to degenerative evulsion.  3.  Tenodesis of biceps long head at bicipital groove.  4.  Open resection of distal clavicle.   OPERATING SURGEON:  Josephine Igo, M.D.   ASSISTANT:  Molly Maduro Dasnoit PA-C.   ANESTHESIA:  General endotracheal supplemented by interscalene block,  supervising anesthesiologist Dr. Gelene Mink.   INDICATIONS:  Christopher Tran 77 year old gentleman referred by Dr. Wyonia Hough for evaluation and management of his right shoulder. He has a history  of chronic right shoulder pain, weakness of abduction and pain while trying  to cast fishing.   Clinical examination revealed marked weakness of abduction external  rotation. Plain films revealed a very arthritic AC joint with a high-riding  humeral head.   An MRI the  shoulder documented an extensive degenerative avulsion of the  cuff extending from the subscapularis anteriorly all the way posteriorly to  the central portion of the teres minor. There was a thin layer of bursa  still connecting the cuff to the tuberosity; however, there is considerable  retraction of the deep portion of the tendon. There is also a significant  degree of bony overgrowth at the anterior greater tuberosity due to chronic  impingement and a 90-95% biceps tendinopathy rupture just proximal to the  bicipital groove.   After informed consent, we advised Mr. Mode at this time to proceed with  arthroscopic evaluation of his shoulder, arthroscopic debridement of the  glenohumeral joint including the biceps tendon, probable biceps tenodesis  and reconstruction of his cuff. We anticipate incidental subacromial  decompression and open resection of distal clavicle.   After informed consent, questions invited and answered. We proceed.   PROCEDURE:  [name] was brought to operating room and placed in  supine position on the operating table. Following anesthesia consult by Dr.  Gelene Mink, general anesthesia was induced under Dr. Thornton Dales direct  supervision with endotracheal technique. The right arm had excellent  anesthesia from interscalene block placed in holding area.  Following careful position in beach-chair position with aid of torso and  head holder designed for shoulder arthroscopy, the right forequarter and  brachium were prepped with DuraPrep and draped with impervious arthroscopy  drapes.   Procedure commenced with distension of the shoulder joint with 20 mL of  sterile saline brought in with an anterior spinal needle approach. The scope  was placed through a standard posterior viewing portal without difficulty.  Diagnostic arthroscopy revealed extensive degenerative changes of the  humeral head from the bicipital groove posteriorly to the insertion of  the  teres minor. There is a degenerative tear of the supraspinatus,  infraspinatus and anterior teres minor. The subscapularis had moderate  degenerative changes. The long head of biceps was shredded and hanging  within the joint. There was a very thin 1-2 mm wide band of remnant of the  tendon extending to the bicipital groove.   A anterior portal was created under direct vision followed by use of suction  shaver to thoroughly debride the labrum granulation tissues, the deep  surface rotator cuff and the biceps down to the thin rim which allowed [place] to  retrieve the distal portion of the biceps for tenodesis.   After completion of intra-articular debridement, the scope was placed  subacromial space and a thorough debridement accomplished to identify the  anatomy the coracoacromial ligament and the AC joint. The distal clavicle  was quite prominent. The cutting cautery was used to release the inferior  capsule of the [facility] joint followed by hemostasis. We then converted to an open  procedure. A 6 cm incision was fashioned across the anterior acromion from  the [facility] joint laterally. The capsule of AC joint was taken down and the  distal 15 mm clavicle exposed with an osteotome subperiosteally. An  oscillating saw was used complete a resection distal clavicle. Hemostasis  was achieved with bipolar cautery followed by release of coracoacromial  ligament and leveling the acromion with an oscillating saw to a type 1  morphology. After more bursectomy the anatomy of the tear was visualized. An  incision was made anteriorly paralleling the path the biceps tendon. This  was used create an L-shaped leaflet lifting up the flap of released tendon.  The deep surface tendon was thoroughly debrided of all necrotic tendon  tissues and the greater tuberosity decorticated and lowered in profile  approximately 2-3 mm with a power bur. The bony osteophytes adjacent to the bicipital groove were removed a power  bur and rongeur dissection.   Long head of biceps was retrieved secured with a grasping suture of #2  FiberWire and tenodesis of bicipital groove primarily using the  intertubercular ligament.   The remnants of the biceps still within the joint were removed with a large  rongeur.   After irrigation of the shoulder for biocorkscrew anchors were placed, one  at the anterior portion of the teres minor, one at the junction the teres  minor and infraspinatus, one at the junction of the infraspinatus,  supraspinatus and one at the anterior edge of the supraspinatus to create a  medial foot print. [place] through bone suture was then used to advance  the tendon laterally and inserted anatomically. A total of eight mattress  sutures were used to inset the medial foot print followed by completion of  repair with a running suture at the margin of tuberosity of 0 Vicryl. All  knots buried deep.   An anatomic repair was achieved with a low profile biceps tenodesis.  The subacromial space then thoroughly lavaged sterile saline. Excess bursa  debrided followed by repair the anterior third of the deltoid to the  acromion with capsular sutures at the [facility] joint and periosteal sutures across  the anterior acromion. An anatomic repair of the deltoid was accomplished  followed by repair of the deltoid split laterally with mattress suture of 0  Vicryl.   The skin was repaired with subdermal suture of 2-0 Vicryl,  intradermal 3-0  Prolene with Steri-Strips.   Marcaine was not utilized as a block was noted be quite complete  preoperatively.   Mr. Xiang was awakened from anesthesia, transferred to recovery room  stable vital signs. He will be admitted to recovery care center for  observation of his vital signs and will be observed 24 hours overnight with  IV Ancef 1 gram q.8 h x3 doses and use of p.o. and IV Dilaudid as an  analgesic.      Katy Fitch Sypher, M.D.  Electronically  Signed     RVS/MEDQ  D:  03/29/2005  T:  03/29/2005  Job:  [id number]   cc:   Rosalyn Gess. Norins, M.D. [facility]  [address]  [place]  [place] [address]

## 2010-08-17 ENCOUNTER — Telehealth: Payer: Self-pay | Admitting: *Deleted

## 2010-08-17 MED ORDER — ZOSTER VACCINE LIVE 19400 UNT/0.65ML ~~LOC~~ SOLR: 0.6500 mL | Freq: Once | SUBCUTANEOUS | Status: DC

## 2010-08-17 NOTE — Telephone Encounter (Signed)
Prescription signed and given to pt

## 2010-11-21 ENCOUNTER — Ambulatory Visit (INDEPENDENT_AMBULATORY_CARE_PROVIDER_SITE_OTHER): Payer: Medicare Other | Admitting: *Deleted

## 2010-11-21 DIAGNOSIS — Z23 Encounter for immunization: Secondary | ICD-10-CM

## 2011-02-01 ENCOUNTER — Telehealth: Payer: Self-pay | Admitting: Internal Medicine

## 2011-02-01 NOTE — Telephone Encounter (Signed)
Pharmacy faxed a refill request for Amiloride 5/50mg  is this ok to refill the medication isn't in his chart.  Please advise.  Pharmacy [facility].

## 2011-02-02 NOTE — Telephone Encounter (Signed)
Ok for amilorid 5/50 1 po qd, # 30, 1 refill. Will need an ov

## 2011-02-23 ENCOUNTER — Other Ambulatory Visit: Payer: Self-pay | Admitting: *Deleted

## 2011-02-23 MED ORDER — AMILORIDE-HYDROCHLOROTHIAZIDE 5-50 MG PO TABS: 1.0000 | ORAL_TABLET | Freq: Every day | ORAL | Status: DC

## 2011-03-01 ENCOUNTER — Other Ambulatory Visit: Payer: Self-pay | Admitting: *Deleted

## 2011-03-01 MED ORDER — AMILORIDE-HYDROCHLOROTHIAZIDE 5-50 MG PO TABS: 1.0000 | ORAL_TABLET | Freq: Every day | ORAL | Status: DC

## 2011-05-24 ENCOUNTER — Encounter: Payer: Self-pay | Admitting: Internal Medicine

## 2011-05-28 ENCOUNTER — Telehealth: Payer: Self-pay | Admitting: *Deleted

## 2011-05-28 ENCOUNTER — Other Ambulatory Visit (INDEPENDENT_AMBULATORY_CARE_PROVIDER_SITE_OTHER): Payer: Medicare Other

## 2011-05-28 ENCOUNTER — Ambulatory Visit (INDEPENDENT_AMBULATORY_CARE_PROVIDER_SITE_OTHER): Payer: Medicare Other | Admitting: Internal Medicine

## 2011-05-28 ENCOUNTER — Encounter: Payer: Self-pay | Admitting: *Deleted

## 2011-05-28 ENCOUNTER — Encounter: Payer: Self-pay | Admitting: Internal Medicine

## 2011-05-28 VITALS — BP 136/82 | HR 64 | Temp 97.8°F | Resp 16 | Ht 65.25 in | Wt 178.8 lb

## 2011-05-28 DIAGNOSIS — R7989 Other specified abnormal findings of blood chemistry: Secondary | ICD-10-CM

## 2011-05-28 DIAGNOSIS — I10 Essential (primary) hypertension: Secondary | ICD-10-CM | POA: Diagnosis not present

## 2011-05-28 DIAGNOSIS — Z0000 Encounter for general adult medical examination without abnormal findings: Secondary | ICD-10-CM

## 2011-05-28 DIAGNOSIS — R3915 Urgency of urination: Secondary | ICD-10-CM

## 2011-05-28 DIAGNOSIS — M479 Spondylosis, unspecified: Secondary | ICD-10-CM

## 2011-05-28 LAB — COMPREHENSIVE METABOLIC PANEL WITH GFR
ALT: 13 U/L (ref 0–53)
AST: 15 U/L (ref 0–37)
Albumin: 4.5 g/dL (ref 3.5–5.2)
Alkaline Phosphatase: 67 U/L (ref 39–117)
BUN: 19 mg/dL (ref 6–23)
CO2: 30 meq/L (ref 19–32)
Calcium: 9.5 mg/dL (ref 8.4–10.5)
Chloride: 103 meq/L (ref 96–112)
Creatinine, Ser: 0.9 mg/dL (ref 0.4–1.5)
GFR: 83.35 mL/min (ref >60.00)
Glucose, Bld: 115 mg/dL — ABNORMAL HIGH (ref 70–99)
Potassium: 3.7 meq/L (ref 3.5–5.1)
Sodium: 140 meq/L (ref 135–145)
Total Bilirubin: 1 mg/dL (ref 0.3–1.2)
Total Protein: 7.7 g/dL (ref 6.0–8.3)

## 2011-05-28 LAB — HEPATIC FUNCTION PANEL
ALT: 13 U/L (ref 0–53)
AST: 15 U/L (ref 0–37)
Alkaline Phosphatase: 67 U/L (ref 39–117)
Bilirubin, Direct: 0.1 mg/dL (ref 0.0–0.3)
Total Bilirubin: 1 mg/dL (ref 0.3–1.2)

## 2011-05-28 LAB — CBC WITH DIFFERENTIAL/PLATELET
Basophils Absolute: 0 10*3/uL (ref 0.0–0.1)
Eosinophils Absolute: 0.2 10*3/uL (ref 0.0–0.7)
HCT: 44.1 % (ref 39.0–52.0)
Hemoglobin: 14.8 g/dL (ref 13.0–17.0)
Lymphocytes Relative: 13.6 % (ref 12.0–46.0)
Lymphs Abs: 1.1 10*3/uL (ref 0.7–4.0)
MCHC: 33.6 g/dL (ref 30.0–36.0)
MCV: 93.5 fl (ref 78.0–100.0)
Monocytes Absolute: 0.6 10*3/uL (ref 0.1–1.0)
Neutro Abs: 6.1 10*3/uL (ref 1.4–7.7)
RDW: 13.7 % (ref 11.5–14.6)

## 2011-05-28 LAB — LIPID PANEL: Total CHOL/HDL Ratio: 4

## 2011-05-28 MED ORDER — OMEPRAZOLE 20 MG PO CPDR: 20.0000 mg | DELAYED_RELEASE_CAPSULE | Freq: Every day | ORAL | Status: DC | PRN

## 2011-05-28 MED ORDER — HYDROCODONE-ACETAMINOPHEN 5-325 MG PO TABS: 1.0000 | ORAL_TABLET | Freq: Four times a day (QID) | ORAL | Status: DC | PRN

## 2011-05-28 MED ORDER — AMLODIPINE BESYLATE 5 MG PO TABS: 5.0000 mg | ORAL_TABLET | Freq: Every day | ORAL | Status: DC

## 2011-05-28 NOTE — Assessment & Plan Note (Addendum)
BP Readings from Last 3 Encounters:  05/28/11 136/82  05/17/10 148/82  04/04/09 188/90   Better control and close to goal of 130 or less over 80's. Lytes and renal function are OK.  Plan - continue present medications  Plan -continue present medications.

## 2011-05-28 NOTE — Progress Notes (Signed)
Subjective:    Patient ID: Christopher Tran, male    DOB: 1926/02/27, 78 y.o.   MRN: [id number]  HPI The patient is here for annual Medicare wellness examination and management of other chronic and acute problems. No major illness or hospitalization, surgery, injury.  He c/o urinary urgency and some frequency, denies nocturia, decreased force of stream, no post void dribble. He describes decreased exercise tolerance but denies chest pain or leg fatigue. Home monitoring of BP has been in normal range, pulse is in the 50's.   He has recurrent lid lag despite previous blephroplasty. He does report that he has occasional double vision.   The risk factors are reflected in the social history.  The roster of all physicians providing medical care to patient - is listed in the Snapshot section of the chart.  Activities of daily living:  The patient is 100% inedpendent in all ADLs: dressing, toileting, feeding as well as independent mobility  Home safety : The patient has smoke detectors in the home. Falls - none. House is fall safe.  They wear seatbelts.  firearms are present in the home, kept in a safe fashion. There is no violence in the home.   There is no risks for hepatitis, STDs or HIV. There is no history of blood transfusion. They have no travel history to infectious disease endemic areas of the world.  The patient has seen their dentist in the last six month. They have seen their eye doctor in the last year. They deny any hearing difficulty and have not had audiologic testing in the last year.  They do not  have excessive sun exposure. Discussed the need for sun protection: hats, long sleeves and use of sunscreen if there is significant sun exposure.   Diet: the importance of a healthy diet is discussed. They do have a healthy diet.  The patient has no regular exercise program.  The benefits of regular aerobic exercise were discussed.  Depression screen: there are no signs or vegative  symptoms of depression- irritability, change in appetite, anhedonia, sadness/tearfullness.  Cognitive assessment: the patient manages all their financial and personal affairs and is actively engaged. They could relate day,date,year and events; recalled 3/3 objects at 3 minutes; performed clock-face test normally.  The following portions of the patient's history were reviewed and updated as appropriate: allergies, current medications, past family history, past medical history,  past surgical history, past social history  and problem list.  Vision, hearing, body mass index were assessed and reviewed.   During the course of the visit the patient was educated and counseled about appropriate screening and preventive services including : fall prevention , diabetes screening, nutrition counseling, colorectal cancer screening, and recommended immunizations.  Past Medical History  Diagnosis Date  . DEGENERATIVE JOINT DISEASE, LUMBAR SPINE 01/06/2007    Qualifier: Diagnosis of  By: Dance CMA (AAMA), Kim    . FASCIITIS, PLANTAR 01/06/2007    Qualifier: Diagnosis of  By: Dance CMA (AAMA), Kim    . HYPERTENSION 01/06/2007    Qualifier: Diagnosis of  By: Dance CMA (AAMA), Kim    . Other abnormal blood chemistry 02/03/2007    Qualifier: Diagnosis of  By: Debby Bud MD, Rosalyn Gess    Past Surgical History  Procedure Date  . Tonsillectomy   . Plantar fascilitis   . Cataract extraction 2006    bilateral  . Blepharoplasty 2006    Bilateral  . Rotator cuff repair 2007    right   Family History  Problem Relation Age of Onset  . Other Father     Killed in [employer] [place]  . Other Mother     Diphtheria  . Other Brother     Killed in MVA 07-13-2022  . Other Sister     Died at [employer]  . Prostate cancer Neg Hx   . Colon cancer Neg Hx    History   Social History  . Marital Status: Married    Spouse Name: N/A    Number of Children: 1  . Years of Education: 12   Occupational History  . [employer]     retired    Social History Main Topics  . Smoking status: Former Games developer  . Smokeless tobacco: Not on file   Comment: >50 years ago.  . Alcohol Use: Not on file  . Drug Use: Not on file  . Sexually Active: Not on file   Other Topics Concern  . Not on file   Social History Narrative   Married '57-she had nephrectomy for RCC-robotic surgery. 1 son - 71. 2 grandchildren. 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 Keeps Busy. End of Life Care: No heroic measures of life support; i.e. DNR       Review of Systems Constitutional:  Negative for fever, chills, activity change and unexpected weight change.  HEENT:  Negative for hearing loss, ear pain, congestion, neck stiffness and postnasal drip. Negative for sore throat or swallowing problems. Negative for dental complaints.   Eyes: Negative for vision loss or change in visual acuity.  Respiratory: Negative for chest tightness and wheezing. Negative for DOE.   Cardiovascular: Negative for chest pain or palpitations. No decreased exercise tolerance Gastrointestinal: No change in bowel habit. No bloating or gas. No reflux or indigestion Genitourinary: Positive for urgency; negative frequency, flank pain and difficulty urinating.  Musculoskeletal: Positive for myalgias, back pain, arthralgias and gait problem.  Neurological: Negative for dizziness, tremors, weakness and headaches.  Hematological: Negative for adenopathy.  Psychiatric/Behavioral: Negative for behavioral problems and dysphoric mood.       Objective:   Physical Exam Filed Vitals:   05/28/11 0907  BP: 136/82  Pulse: 64  Temp: 97.8 F (36.6 C)  Resp: 16   Wt Readings from Last 3 Encounters:  05/28/11 178 lb 12 oz (81.08 kg)  05/17/10 182 lb (82.555 kg)  04/04/09 189 lb (85.73 kg)   Gen'l: Well nourished well developed white man in no acute distress  HEENT: Head: Normocephalic and atraumatic. Right Ear: External ear normal. EAC/TM nl. Left Ear: External ear normal.  EAC/TM nl.  Nose: Nose normal. Mouth/Throat: Oropharynx is clear and moist. Dentition - full upper denture, partial lower in good repair. No buccal or lesions. Posterior pharynx clear. Eyes: Conjunctivae and sclera clear. EOM intact. Pupils are equal, round with arcus senilis, and reactive to light. Right eye exhibits no discharge. Left eye exhibits no discharge. Upper lid right to edge of iris, upper lid left to just above the iris. Neck: Normal range of motion. Neck supple. No JVD present. No tracheal deviation present. No thyromegaly present.  Cardiovascular: Normal rate, regular rhythm, no gallop, no friction rub, no murmur heard.      Quiet precordium. 2+ radial and DP pulses . No carotid bruits Pulmonary/Chest: Effort normal. No respiratory distress or increased WOB, no wheezes, no rales. No chest wall deformity or CVAT. Abdominal: Soft. Bowel sounds are normal in all quadrants. He exhibits no distension, no tenderness, no rebound or guarding, No heptosplenomegaly  Genitourinary:  Musculoskeletal: Normal range of motion. He exhibits no edema and no tenderness.       Small and large joints without redness, synovial thickening or deformity. Full range of motion preserved about all small, median and large joints.  Lymphadenopathy:    He has no cervical or supraclavicular adenopathy.  Neurological: He is alert and oriented to person, place, and time. CN II-XII intact. DTRs 2+ and symmetrical biceps, radial and patellar tendons. Cerebellar function normal with no tremor, rigidity, normal gait and station.  Skin: Skin is warm and dry. No rash noted. No erythema.  Psychiatric: He has a normal mood and affect. His behavior is normal. Thought content normal.   Lab Results  Component Value Date   WBC 8.0 05/28/2011   HGB 14.8 05/28/2011   HCT 44.1 05/28/2011   PLT 218.0 05/28/2011   GLUCOSE 115* 05/28/2011   CHOL 178 05/28/2011   TRIG 80.0 05/28/2011   HDL 39.60 05/28/2011   LDLCALC 122* 05/28/2011        ALT 13  05/28/2011   AST 15 05/28/2011        NA 140 05/28/2011   K 3.7 05/28/2011   CL 103 05/28/2011   CREATININE 0.9 05/28/2011   BUN 19 05/28/2011   CO2 30 05/28/2011   TSH 1.46 05/17/2010   PSA 1.75 02/03/2007           Assessment & Plan:

## 2011-05-28 NOTE — Assessment & Plan Note (Signed)
Chronic pain from DJD but not limited in his ADLs. He did not perceive benefit from Relafen or tramadol. He gets relief from Vicodin  Plan - D/c Relafen and Tramadol           Continue to use glucosamine           Take APAP 500 mg tid on schedule - maximum daily dose of 3,000 mg stressed as the limit and he is advised of the APAP in vicodin            Rx sent in for Vicodin. He is warned of the narcotic nature and the risks associated including addiction.

## 2011-05-28 NOTE — Assessment & Plan Note (Signed)
Interval medical history notable for increased urinary frequency w/o BPH symptoms. Suspect mild irritable bladder.  Plan - trial of Vesicare 5mg  daily (14 provided) with call back with response to medication.  Physical exam is notable for lid lag, he is otherwise normal for his age. He has had blepharoplasty in the past and is not interested in repeat surgery at this time.  Lab results indicate decrease in LDL from 146 to 122 , the latter being within range of normal per NCEP guidelines. He had colorectal cancer screening at age 78. He has no signs or symptoms of bowel change, thus no indication for screening. He has aged out of prostate cancer screening.   In summary - a nice man who appears to be medically stable. He will return as needed or in  1 year.

## 2011-05-28 NOTE — Patient Instructions (Signed)
Good exam today except for the lid-lag.  Medication changes: stop the Relafen (nabumetone) and the tramadol. Be carefull about the use of hydrocodone - a narcotic. This also contains 325 mg of tylenol. For good pain management you should take tylenol 500 mg three times a day on a regular schedule to help with the pain. The maximum dose of tylenol per day is 3,000 mg.  You will get a full report in the mail.

## 2011-05-28 NOTE — Assessment & Plan Note (Signed)
LDL is down from 146 to 122, which is normal range. Serum glucose at the borderline (70-115 target range).  Plan - careful about diet- keeping to low fat.

## 2011-05-28 NOTE — Telephone Encounter (Signed)
Same Day abstraction/SLS

## 2011-06-08 ENCOUNTER — Other Ambulatory Visit: Payer: Self-pay | Admitting: *Deleted

## 2011-06-08 MED ORDER — AMILORIDE-HYDROCHLOROTHIAZIDE 5-50 MG PO TABS: 1.0000 | ORAL_TABLET | Freq: Every day | ORAL | Status: DC

## 2011-06-08 MED ORDER — SOLIFENACIN SUCCINATE 5 MG PO TABS: 5.0000 mg | ORAL_TABLET | Freq: Every day | ORAL | Status: DC

## 2011-06-08 NOTE — Telephone Encounter (Signed)
Done

## 2011-08-07 DIAGNOSIS — A938 Other specified arthropod-borne viral fevers: Secondary | ICD-10-CM | POA: Diagnosis not present

## 2011-12-05 ENCOUNTER — Ambulatory Visit (INDEPENDENT_AMBULATORY_CARE_PROVIDER_SITE_OTHER): Payer: Medicare Other

## 2011-12-05 DIAGNOSIS — Z23 Encounter for immunization: Secondary | ICD-10-CM | POA: Diagnosis not present

## 2012-06-06 ENCOUNTER — Telehealth: Payer: Self-pay | Admitting: Internal Medicine

## 2012-06-06 MED ORDER — OMEPRAZOLE 20 MG PO CPDR: 20.0000 mg | DELAYED_RELEASE_CAPSULE | Freq: Every day | ORAL | Status: DC | PRN

## 2012-06-06 MED ORDER — AMLODIPINE BESYLATE 5 MG PO TABS: 5.0000 mg | ORAL_TABLET | Freq: Every day | ORAL | Status: DC

## 2012-06-06 MED ORDER — AMILORIDE-HYDROCHLOROTHIAZIDE 5-50 MG PO TABS: 1.0000 | ORAL_TABLET | Freq: Every day | ORAL | Status: DC

## 2012-06-06 NOTE — Telephone Encounter (Signed)
Request refill on amlopdipine 5mg , amiloride/HCTZ 5mg , omeprazde 20mg , please call into [facility]. Pt request to be made aware once done

## 2012-06-06 NOTE — Telephone Encounter (Signed)
Phone call to pt's home# letting him know his meds were sent to [facility] as requested.

## 2012-06-19 ENCOUNTER — Encounter: Payer: Medicare Other | Admitting: Internal Medicine

## 2012-08-07 ENCOUNTER — Ambulatory Visit (INDEPENDENT_AMBULATORY_CARE_PROVIDER_SITE_OTHER): Payer: Medicare Other | Admitting: Internal Medicine

## 2012-08-07 ENCOUNTER — Other Ambulatory Visit: Payer: Medicare Other

## 2012-08-07 ENCOUNTER — Encounter: Payer: Self-pay | Admitting: Internal Medicine

## 2012-08-07 VITALS — BP 140/80 | HR 60 | Temp 97.6°F | Ht 66.0 in | Wt 176.4 lb

## 2012-08-07 DIAGNOSIS — M479 Spondylosis, unspecified: Secondary | ICD-10-CM

## 2012-08-07 DIAGNOSIS — I10 Essential (primary) hypertension: Secondary | ICD-10-CM | POA: Diagnosis not present

## 2012-08-07 DIAGNOSIS — Z0000 Encounter for general adult medical examination without abnormal findings: Secondary | ICD-10-CM

## 2012-08-07 LAB — COMPREHENSIVE METABOLIC PANEL
ALT: 15 U/L (ref 0–53)
AST: 14 U/L (ref 0–37)
Albumin: 4.2 g/dL (ref 3.5–5.2)
BUN: 22 mg/dL (ref 6–23)
CO2: 28 mEq/L (ref 19–32)
Calcium: 9.7 mg/dL (ref 8.4–10.5)
Chloride: 104 mEq/L (ref 96–112)
Potassium: 3.7 mEq/L (ref 3.5–5.1)

## 2012-08-07 MED ORDER — AMILORIDE-HYDROCHLOROTHIAZIDE 5-50 MG PO TABS: 1.0000 | ORAL_TABLET | Freq: Every day | ORAL | Status: DC

## 2012-08-07 MED ORDER — HYDROCODONE-ACETAMINOPHEN 5-325 MG PO TABS: 1.0000 | ORAL_TABLET | Freq: Four times a day (QID) | ORAL | Status: DC | PRN

## 2012-08-07 MED ORDER — OMEPRAZOLE 20 MG PO CPDR: 20.0000 mg | DELAYED_RELEASE_CAPSULE | Freq: Every day | ORAL | Status: DC | PRN

## 2012-08-07 MED ORDER — AMLODIPINE BESYLATE 5 MG PO TABS: 5.0000 mg | ORAL_TABLET | Freq: Every day | ORAL | Status: DC

## 2012-08-07 MED ORDER — FISH OIL 1000 MG PO CAPS: 1000.0000 mg | ORAL_CAPSULE | Freq: Every day | ORAL | Status: DC

## 2012-08-07 NOTE — Assessment & Plan Note (Signed)
Interval history is benign for any major medical illness, surgery or injury. He is following with his ophthalmologist. Physical exam is stable for a man of his years. Lab - in normal range. He is current re colorectal screening with no indication for further screening, likewise with screening for prostate cancer. Immunizations - offered but declined. Will reoffer at future visits. ACP has been addressed.  In summary - a very nice man who is medically stable at this time. He will return in 1 year or as needed.

## 2012-08-07 NOTE — Assessment & Plan Note (Signed)
Chronic back pain but he is very functional and active. No change in regimen

## 2012-08-07 NOTE — Patient Instructions (Addendum)
Thanks for coming to see me  Your exam is fine and you seems to be doing well. Talk with your eye doctor about doing the eyelid surgery again. Pain in the left jaw joint = TMJ. To minimze discomfort cut food into smaller pieces to avoid overworking the joint. For pain you can rub in aspercreme over the external aspect of the joint.  Basic lab today and report will be in the note I send you.  Come back in 1 year or sooner as needed.   Temporomandibular Problems  Temporomandibular joint (TMJ) dysfunction means there are problems with the joint between your jaw and your skull. This is a joint lined by cartilage like other joints in your body but also has a small disc in the joint which keeps the bones from rubbing on each other. These joints are like other joints and can get inflamed (sore) from arthritis and other problems. When this joint gets sore, it can cause headaches and pain in the jaw and the face. CAUSES  Usually the arthritic types of problems are caused by soreness in the joint. Soreness in the joint can also be caused by overuse. This may come from grinding your teeth. It may also come from mis-alignment in the joint. DIAGNOSIS Diagnosis of this condition can often be made by history and exam. Sometimes your caregiver may need X-rays or an MRI scan to determine the exact cause. It may be necessary to see your dentist to determine if your teeth and jaws are lined up correctly. TREATMENT  Most of the time this problem is not serious; however, sometimes it can persist (become chronic). When this happens medications that will cut down on inflammation (soreness) help. Sometimes a shot of cortisone into the joint will be helpful. If your teeth are not aligned it may help for your dentist to make a splint for your mouth that can help this problem. If no physical problems can be found, the problem may come from tension. If tension is found to be the cause, biofeedback or relaxation techniques may be  helpful. HOME CARE INSTRUCTIONS   Later in the day, applications of ice packs may be helpful. Ice can be used in a plastic bag with a towel around it to prevent frostbite to skin. This may be used about every 2 hours for 20 to 30 minutes, as needed while awake, or as directed by your caregiver.  Only take over-the-counter or prescription medicines for pain, discomfort, or fever as directed by your caregiver.  If physical therapy was prescribed, follow your caregiver's directions.  Wear mouth appliances as directed if they were given. Document Released: 11/14/2000 Document Revised: 05/14/2011 Document Reviewed: 02/22/2008 [facility] Patient Information 2014 [place], [place].

## 2012-08-07 NOTE — Assessment & Plan Note (Signed)
BP Readings from Last 3 Encounters:  08/07/12 140/80  05/28/11 136/82  05/17/10 148/82   Adequate control onpresent medication. Labs are normal  Plan Continue present regimen

## 2012-08-07 NOTE — Progress Notes (Signed)
Subjective:    Patient ID: Christopher Tran, male    DOB: 05-Mar-1926, 79 y.o.   MRN: [id number]  HPI Christopher Tran is here for annual Medicare wellness examination and management of other chronic and acute problems.  He continues to have low back pain. He has decreased exercise tolerance - but does keep a 1/4 acre garden. Chronic foot pain. He did try alpha agonist for BPH but had GI side effects but he is getting good results with cranberry   The risk factors are reflected in the social history.  The roster of all physicians providing medical care to patient - is listed in the Snapshot section of the chart.  Activities of daily living:  The patient is 100% inedpendent in all ADLs: dressing, toileting, feeding as well as independent mobility  Home safety : The patient has smoke detectors in the home. Falls - no falls and home is fall safe.They wear seatbelts.  firearms are present in the home, kept in a safe fashion. There is no violence in the home.   There is no risks for hepatitis, STDs or HIV. There is no history of blood transfusion. They have no travel history to infectious disease endemic areas of the world.  The patient has seen their dentist in the last six month. They have seen their eye doctor in the last year - itchy/watery eyes; night vision is poor and he doesn't drive at night. They deny any hearing difficulty and have not had audiologic testing in the last year.  They do not  have excessive sun exposure. Discussed the need for sun protection: hats, long sleeves and use of sunscreen if there is significant sun exposure.   Diet: the importance of a healthy diet is discussed. They do have a healthy diet.  The patient has no regular exercise program.  The benefits of regular aerobic exercise were discussed.  Depression screen: there are no signs or vegative symptoms of depression- irritability, change in appetite, anhedonia, sadness/tearfullness.  Cognitive assessment: the  patient manages all their financial and personal affairs and is actively engaged.   The following portions of the patient's history were reviewed and updated as appropriate: allergies, current medications, past family history, past medical history,  past surgical history, past social history  and problem list.  Vision, hearing, body mass index were assessed and reviewed.   During the course of the visit the patient was educated and counseled about appropriate screening and preventive services including : fall prevention , diabetes screening, nutrition counseling, colorectal cancer screening, and recommended immunizations.  Past Medical History  Diagnosis Date  . DEGENERATIVE JOINT DISEASE, LUMBAR SPINE 01/06/2007    Qualifier: Diagnosis of  By: Dance CMA (AAMA), Kim    . FASCIITIS, PLANTAR 01/06/2007    Qualifier: Diagnosis of  By: Dance CMA (AAMA), Kim    . HYPERTENSION 01/06/2007    Qualifier: Diagnosis of  By: Dance CMA (AAMA), Kim    . Other abnormal blood chemistry 02/03/2007    Qualifier: Diagnosis of  By: Debby Bud MD, Rosalyn Gess    Past Surgical History  Procedure Laterality Date  . Tonsillectomy    . Plantar fascilitis    . Cataract extraction  2006    bilateral  . Blepharoplasty  2006    Bilateral  . Rotator cuff repair  2007    right   Family History  Problem Relation Age of Onset  . Other Father     Killed in [employer] [place]  . Other Mother  Diphtheria  . Other Brother     Killed in MVA 07/19/2022  . Other Sister     Died at [employer]  . Prostate cancer Neg Hx   . Colon cancer Neg Hx    History   Social History  . Marital Status: Married    Spouse Name: N/A    Number of Children: 1  . Years of Education: 12   Occupational History  . 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 Smoking status: Former Games developer  . Smokeless tobacco: Not on file     Comment: >50 years ago.  . Alcohol Use: Yes     Comment: occ beer  . Drug Use: No  . Sexually Active: Not on file    Other Topics Concern  . Not on file   Social History Narrative   Married '57-she had nephrectomy for RCC-robotic surgery. 1 son - 47. 2 grandchildren. 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 Keeps Busy. End of Life Care: No heroic measures of life support; i.e. DNR    Current Outpatient Prescriptions on File Prior to Visit  Medication Sig Dispense Refill  . amiloride-hydrochlorothiazide (MODURETIC) 5-50 MG tablet Take 1 tablet by mouth daily.  90 tablet  0  . amLODipine (NORVASC) 5 MG tablet Take 1 tablet (5 mg total) by mouth daily.  90 tablet  0  . cholecalciferol (VITAMIN D) 1000 UNITS tablet Take 1,000 Units by mouth daily.      Marland Kitchen HYDROcodone-acetaminophen (NORCO) 5-325 MG per tablet Take 1 tablet by mouth every 6 (six) hours as needed. Pain.  90 tablet  3  . Omega-3 Fatty Acids (FISH OIL) 1000 MG CAPS Take by mouth daily.      Marland Kitchen omeprazole (PRILOSEC) 20 MG capsule Take 1 capsule (20 mg total) by mouth daily as needed.  90 capsule  0  . zoster vaccine live, PF, (ZOSTAVAX) 16109 UNT/0.65ML injection Inject 19,400 Units into the skin once.  1 vial  0   No current facility-administered medications on file prior to visit.      Review of Systems Constitutional:  Negative for fever, chills, activity change and unexpected weight change.  HEENT:  Negative for hearing loss, ear pain, congestion, neck stiffness and postnasal drip. Negative for sore throat or swallowing problems. Negative for dental complaints.   Eyes: Negative for vision loss or change in visual acuity.  Respiratory: Negative for chest tightness and wheezing. Negative for DOE.   Cardiovascular: Negative for chest pain or palpitations. Mild decreased exercise tolerance Gastrointestinal: No change in bowel habit. No bloating or gas. No reflux or indigestion Genitourinary: Negative for urgency, frequency, flank pain and difficulty urinating. Occasional problem. Musculoskeletal: Positive for myalgias, back pain, arthralgias.   Neurological: Negative for dizziness, tremors, weakness and headaches.  Hematological: Negative for adenopathy.  Psychiatric/Behavioral: Negative for behavioral problems and dysphoric mood.       Objective:   Physical Exam Filed Vitals:   08/07/12 0952  BP: 140/80  Pulse: 60  Temp: 97.6 F (36.4 C)   Wt Readings from Last 3 Encounters:  08/07/12 176 lb 6.4 oz (80.015 kg)  05/28/11 178 lb 12 oz (81.08 kg)  05/17/10 182 lb (82.555 kg)   Gen'l: Well nourished well developed white male in no acute distress  HEENT: Head: Normocephalic and atraumatic. Right Ear: External ear normal. EAC/TM nl. Left Ear: External ear normal.  EAC/TM nl. Nose: Nose normal. Mouth/Throat: Oropharynx is clear and moist. Dentition - Full upper  and lower partial dentures. No buccal or palatal lesions. Posterior pharynx clear. Eyes: Conjunctivae and sclera clear. EOM intact. Pupils are equal, round, and reactive to light. Right eye exhibits no discharge. Left eye exhibits no discharge. Marked lid lag, R>L, with right drooping over the pupil. Neck: Normal range of motion. Neck supple. No JVD present. No tracheal deviation present. No thyromegaly present.  Cardiovascular: Normal rate, regular rhythm, no gallop, no friction rub, no murmur heard.      Quiet precordium. 2+ radial and DP pulses . No carotid bruits Pulmonary/Chest: Effort normal. No respiratory distress or increased WOB, no wheezes, no rales. No chest wall deformity or CVAT. Abdomen: Soft. Bowel sounds are normal in all quadrants. He exhibits no distension, no tenderness, no rebound or guarding, No heptosplenomegaly  Genitourinary:  deferred Musculoskeletal: Normal range of motion. He exhibits no edema and no tenderness.       Small and large joints without redness, synovial thickening or deformity. Full range of motion preserved about all small, median and large joints.  Lymphadenopathy:    He has no cervical or supraclavicular adenopathy.   Neurological: He is alert and oriented to person, place, and time. CN II-XII intact. DTRs 2+ and symmetrical biceps, radial and patellar tendons. Cerebellar function normal with no tremor, rigidity, normal gait and station.  Skin: Skin is warm and dry. No rash noted. No erythema.  Psychiatric: He has a normal mood and affect. His behavior is normal. Thought content normal.   Recent Results (from the past 2160 hour(s))  COMPREHENSIVE METABOLIC PANEL     Status: Abnormal   Collection Time    08/07/12 10:59 AM      Result Value Range   Sodium 141  135 - 145 mEq/L   Potassium 3.7  3.5 - 5.1 mEq/L   Chloride 104  96 - 112 mEq/L   CO2 28  19 - 32 mEq/L   Glucose, Bld 107 (*) 70 - 99 mg/dL   BUN 22  6 - 23 mg/dL   Creatinine, Ser 1.0  0.4 - 1.5 mg/dL   Total Bilirubin 1.4 (*) 0.3 - 1.2 mg/dL   Alkaline Phosphatase 59  39 - 117 U/L   AST 14  0 - 37 U/L   ALT 15  0 - 53 U/L   Total Protein 7.2  6.0 - 8.3 g/dL   Albumin 4.2  3.5 - 5.2 g/dL   Calcium 9.7  8.4 - 16.1 mg/dL   GFR 09.60  >45.40 mL/min         Assessment & Plan:

## 2012-08-08 ENCOUNTER — Encounter: Payer: Self-pay | Admitting: Internal Medicine

## 2012-08-19 DIAGNOSIS — H04129 Dry eye syndrome of unspecified lacrimal gland: Secondary | ICD-10-CM | POA: Diagnosis not present

## 2012-08-19 DIAGNOSIS — H26499 Other secondary cataract, unspecified eye: Secondary | ICD-10-CM | POA: Diagnosis not present

## 2012-08-19 DIAGNOSIS — Z961 Presence of intraocular lens: Secondary | ICD-10-CM | POA: Diagnosis not present

## 2012-12-11 ENCOUNTER — Ambulatory Visit (INDEPENDENT_AMBULATORY_CARE_PROVIDER_SITE_OTHER): Payer: Medicare Other

## 2012-12-11 ENCOUNTER — Other Ambulatory Visit: Payer: Self-pay

## 2012-12-11 DIAGNOSIS — Z23 Encounter for immunization: Secondary | ICD-10-CM

## 2012-12-11 MED ORDER — HYDROCODONE-ACETAMINOPHEN 5-325 MG PO TABS: 1.0000 | ORAL_TABLET | Freq: Four times a day (QID) | ORAL | Status: DC | PRN

## 2012-12-11 NOTE — Telephone Encounter (Signed)
Patient came in for his flu injection and requested a refill on his Hydrocodone.

## 2012-12-29 DIAGNOSIS — H02429 Myogenic ptosis of unspecified eyelid: Secondary | ICD-10-CM | POA: Diagnosis not present

## 2012-12-29 DIAGNOSIS — H02039 Senile entropion of unspecified eye, unspecified eyelid: Secondary | ICD-10-CM | POA: Diagnosis not present

## 2012-12-29 DIAGNOSIS — H02839 Dermatochalasis of unspecified eye, unspecified eyelid: Secondary | ICD-10-CM | POA: Diagnosis not present

## 2012-12-29 DIAGNOSIS — H11439 Conjunctival hyperemia, unspecified eye: Secondary | ICD-10-CM | POA: Diagnosis not present

## 2013-01-05 DIAGNOSIS — H02419 Mechanical ptosis of unspecified eyelid: Secondary | ICD-10-CM | POA: Diagnosis not present

## 2013-01-05 DIAGNOSIS — H02839 Dermatochalasis of unspecified eye, unspecified eyelid: Secondary | ICD-10-CM | POA: Diagnosis not present

## 2013-02-23 DIAGNOSIS — H01029 Squamous blepharitis unspecified eye, unspecified eyelid: Secondary | ICD-10-CM | POA: Diagnosis not present

## 2013-02-23 DIAGNOSIS — H02419 Mechanical ptosis of unspecified eyelid: Secondary | ICD-10-CM | POA: Diagnosis not present

## 2013-02-23 DIAGNOSIS — H02839 Dermatochalasis of unspecified eye, unspecified eyelid: Secondary | ICD-10-CM | POA: Diagnosis not present

## 2013-02-23 DIAGNOSIS — H11439 Conjunctival hyperemia, unspecified eye: Secondary | ICD-10-CM | POA: Diagnosis not present

## 2013-03-02 ENCOUNTER — Telehealth: Payer: Self-pay

## 2013-03-02 NOTE — Telephone Encounter (Signed)
Phone call to patient and left a message stating he will need a pre op visit prior to 03/16/13 procedure on his upper eyelids.

## 2013-03-10 ENCOUNTER — Encounter: Payer: Self-pay | Admitting: Internal Medicine

## 2013-03-10 ENCOUNTER — Ambulatory Visit (INDEPENDENT_AMBULATORY_CARE_PROVIDER_SITE_OTHER): Payer: Medicare Other | Admitting: Internal Medicine

## 2013-03-10 VITALS — BP 162/100 | HR 99 | Temp 97.1°F | Wt 172.0 lb

## 2013-03-10 DIAGNOSIS — Z01818 Encounter for other preprocedural examination: Secondary | ICD-10-CM

## 2013-03-10 MED ORDER — HYDROCODONE-ACETAMINOPHEN 5-325 MG PO TABS: 1.0000 | ORAL_TABLET | Freq: Four times a day (QID) | ORAL | Status: DC | PRN

## 2013-03-10 NOTE — Progress Notes (Signed)
Subjective:    Patient ID: Christopher Tran, male    DOB: Aug 16, 1925, 80 y.o.   MRN: [id number]  HPI Mr. Christopher DowningWillett is going to have bilateral  Blepharoplasty. He may have general anesthesia. He has been feeling well. He does report that he is a little unsteady on his feet.   Past Medical History  Diagnosis Date  . DEGENERATIVE JOINT DISEASE, LUMBAR SPINE 01/06/2007    Qualifier: Diagnosis of  By: Dance CMA (AAMA), Kim    . FASCIITIS, PLANTAR 01/06/2007    Qualifier: Diagnosis of  By: Dance CMA (AAMA), Kim    . HYPERTENSION 01/06/2007    Qualifier: Diagnosis of  By: Dance CMA (AAMA), Kim    . Other abnormal blood chemistry 02/03/2007    Qualifier: Diagnosis of  By: Debby BudNorins MD, Rosalyn GessMichael E    Past Surgical History  Procedure Laterality Date  . Tonsillectomy    . Plantar fascilitis    . Cataract extraction  2006    bilateral  . Blepharoplasty  2006    Bilateral  . Rotator cuff repair  2007    right   Family History  Problem Relation Age of Onset  . Other Father     Killed in [employer]  . Other Mother     Diphtheria  . Other Brother     Killed in MVA '05  . Other Sister     [facility]ied at [employer]  . Prostate cancer Neg Hx   . Colon cancer Neg Hx    History   Social History  . Marital Status: Married    Spouse Name: N/A    Number of Children: 1  . Years of Education: 12   Occupational History  . 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 Smoking status: Former Games developermoker  . Smokeless tobacco: Never Used     Comment: >50 years ago.  . Alcohol Use: Yes     Comment: occ beer  . Drug Use: No  . Sexual Activity: Not Currently   Other Topics Concern  . Not on file   Social History Narrative   Married '57-she had nephrectomy for RCC-robotic surgery. 1 son - [id number]. 2 grandchildren. 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 ACP/End of Life Care: No heroic measures of life support; i.e. DNR, no mechanical ventilation.    Current Outpatient Prescriptions on File  Prior to Visit  Medication Sig Dispense Refill  . amiloride-hydrochlorothiazide (MODURETIC) 5-50 MG tablet Take 1 tablet by mouth daily.  90 tablet  3  . amLODipine (NORVASC) 5 MG tablet Take 1 tablet (5 mg total) by mouth daily.  90 tablet  3  . cholecalciferol (VITAMIN D) 1000 UNITS tablet Take 1,000 Units by mouth daily.      . Omega-3 Fatty Acids (FISH OIL) 1000 MG CAPS Take 1 capsule (1,000 mg total) by mouth daily.  90 capsule  3  . omeprazole (PRILOSEC) 20 MG capsule Take 1 capsule (20 mg total) by mouth daily as needed.  90 capsule  3  . zoster vaccine live, PF, (ZOSTAVAX) [id number] UNT/0.65ML injection Inject 19,400 Units into the skin once.  1 vial  0   No current facility-administered medications on file prior to visit.       Review of Systems System review is negative for any constitutional, cardiac, pulmonary, GI or neuro symptoms or complaints other than as described in the HPI.     Objective:   Physical Exam Filed  Vitals:   03/10/13 1052  BP: 162/100  Pulse: 99  Temp: 97.1 F (36.2 C)   Gen'l - WNWD man in no distress HEENT - eyelids that drop down to the top of the pupil. Cor - 2+ radial, RRR Pulm - normal respiration. CTAP Neuro - A&O x 3, normal       Assessment & Plan:  Pre op clearance for blepharoplasty - he is medically stable and cleared for surgery.

## 2013-03-12 NOTE — Telephone Encounter (Signed)
Patient has an office visit 03/10/13

## 2013-03-16 DIAGNOSIS — H02139 Senile ectropion of unspecified eye, unspecified eyelid: Secondary | ICD-10-CM | POA: Diagnosis not present

## 2013-03-16 DIAGNOSIS — H0289 Other specified disorders of eyelid: Secondary | ICD-10-CM | POA: Diagnosis not present

## 2013-03-16 DIAGNOSIS — H02409 Unspecified ptosis of unspecified eyelid: Secondary | ICD-10-CM | POA: Diagnosis not present

## 2013-03-16 DIAGNOSIS — H02109 Unspecified ectropion of unspecified eye, unspecified eyelid: Secondary | ICD-10-CM | POA: Diagnosis not present

## 2013-03-16 DIAGNOSIS — H02029 Mechanical entropion of unspecified eye, unspecified eyelid: Secondary | ICD-10-CM | POA: Diagnosis not present

## 2013-03-16 DIAGNOSIS — H02429 Myogenic ptosis of unspecified eyelid: Secondary | ICD-10-CM | POA: Diagnosis not present

## 2013-05-28 ENCOUNTER — Other Ambulatory Visit: Payer: Self-pay | Admitting: Internal Medicine

## 2013-06-02 DIAGNOSIS — H26499 Other secondary cataract, unspecified eye: Secondary | ICD-10-CM | POA: Diagnosis not present

## 2013-06-02 DIAGNOSIS — Z961 Presence of intraocular lens: Secondary | ICD-10-CM | POA: Diagnosis not present

## 2013-06-02 DIAGNOSIS — H04129 Dry eye syndrome of unspecified lacrimal gland: Secondary | ICD-10-CM | POA: Diagnosis not present

## 2013-07-13 ENCOUNTER — Telehealth: Payer: Self-pay | Admitting: Internal Medicine

## 2013-07-13 NOTE — Telephone Encounter (Signed)
Pt would like to transfer from Dr. Debby BudNorins.  His wife is a patient of Dr. Posey ReaPlotnikov.  He wants a different provider than wife's.

## 2013-07-13 NOTE — Telephone Encounter (Signed)
Ok with me 

## 2013-07-14 NOTE — Telephone Encounter (Signed)
lmom to return call

## 2013-07-14 NOTE — Telephone Encounter (Signed)
Wife is aware and an appt has been made for July 29.

## 2013-09-30 ENCOUNTER — Encounter: Payer: Self-pay | Admitting: Internal Medicine

## 2013-09-30 ENCOUNTER — Ambulatory Visit (INDEPENDENT_AMBULATORY_CARE_PROVIDER_SITE_OTHER): Payer: Medicare Other | Admitting: Internal Medicine

## 2013-09-30 ENCOUNTER — Other Ambulatory Visit (INDEPENDENT_AMBULATORY_CARE_PROVIDER_SITE_OTHER): Payer: Medicare Other

## 2013-09-30 VITALS — BP 130/78 | HR 97 | Temp 98.2°F | Wt 173.0 lb

## 2013-09-30 DIAGNOSIS — G894 Chronic pain syndrome: Secondary | ICD-10-CM | POA: Diagnosis not present

## 2013-09-30 DIAGNOSIS — I10 Essential (primary) hypertension: Secondary | ICD-10-CM

## 2013-09-30 DIAGNOSIS — Z23 Encounter for immunization: Secondary | ICD-10-CM

## 2013-09-30 DIAGNOSIS — K219 Gastro-esophageal reflux disease without esophagitis: Secondary | ICD-10-CM

## 2013-09-30 LAB — URINALYSIS, ROUTINE W REFLEX MICROSCOPIC
BILIRUBIN URINE: NEGATIVE
Hgb urine dipstick: NEGATIVE
KETONES UR: NEGATIVE
Leukocytes, UA: NEGATIVE
Nitrite: NEGATIVE
RBC / HPF: NONE SEEN (ref 0–?)
Specific Gravity, Urine: 1.01 (ref 1.000–1.030)
TOTAL PROTEIN, URINE-UPE24: NEGATIVE
URINE GLUCOSE: NEGATIVE
Urobilinogen, UA: 0.2 (ref 0.0–1.0)
WBC, UA: NONE SEEN (ref 0–?)
pH: 7.5 (ref 5.0–8.0)

## 2013-09-30 LAB — LIPID PANEL
CHOL/HDL RATIO: 4
Cholesterol: 168 mg/dL (ref 0–200)
HDL: 42.7 mg/dL (ref >39.00)
LDL Cholesterol: 111 mg/dL — ABNORMAL HIGH (ref 0–99)
NonHDL: 125.3
Triglycerides: 74 mg/dL (ref 0.0–149.0)
VLDL: 14.8 mg/dL (ref 0.0–40.0)

## 2013-09-30 LAB — CBC WITH DIFFERENTIAL/PLATELET
BASOS PCT: 0.4 % (ref 0.0–3.0)
Basophils Absolute: 0 10*3/uL (ref 0.0–0.1)
EOS ABS: 0.1 10*3/uL (ref 0.0–0.7)
Eosinophils Relative: 1.8 % (ref 0.0–5.0)
HCT: 41.8 % (ref 39.0–52.0)
HEMOGLOBIN: 14.2 g/dL (ref 13.0–17.0)
Lymphocytes Relative: 11.5 % — ABNORMAL LOW (ref 12.0–46.0)
Lymphs Abs: 0.8 10*3/uL (ref 0.7–4.0)
MCHC: 33.9 g/dL (ref 30.0–36.0)
MCV: 93.7 fl (ref 78.0–100.0)
MONO ABS: 0.5 10*3/uL (ref 0.1–1.0)
Monocytes Relative: 7.4 % (ref 3.0–12.0)
NEUTROS ABS: 5.8 10*3/uL (ref 1.4–7.7)
NEUTROS PCT: 78.9 % — AB (ref 43.0–77.0)
Platelets: 224 10*3/uL (ref 150.0–400.0)
RBC: 4.46 Mil/uL (ref 4.22–5.81)
RDW: 13.6 % (ref 11.5–15.5)
WBC: 7.3 10*3/uL (ref 4.0–10.5)

## 2013-09-30 LAB — HEPATIC FUNCTION PANEL
ALT: 13 U/L (ref 0–53)
AST: 18 U/L (ref 0–37)
Albumin: 4.5 g/dL (ref 3.5–5.2)
Alkaline Phosphatase: 64 U/L (ref 39–117)
BILIRUBIN DIRECT: 0.2 mg/dL (ref 0.0–0.3)
BILIRUBIN TOTAL: 1.5 mg/dL — AB (ref 0.2–1.2)
Total Protein: 7.3 g/dL (ref 6.0–8.3)

## 2013-09-30 LAB — BASIC METABOLIC PANEL
BUN: 17 mg/dL (ref 6–23)
CHLORIDE: 102 meq/L (ref 96–112)
CO2: 28 mEq/L (ref 19–32)
CREATININE: 0.8 mg/dL (ref 0.4–1.5)
Calcium: 9.5 mg/dL (ref 8.4–10.5)
GFR: 96.01 mL/min (ref >60.00)
Glucose, Bld: 121 mg/dL — ABNORMAL HIGH (ref 70–99)
Potassium: 3.5 mEq/L (ref 3.5–5.1)
SODIUM: 137 meq/L (ref 135–145)

## 2013-09-30 LAB — TSH: TSH: 1.8 u[IU]/mL (ref 0.35–4.50)

## 2013-09-30 MED ORDER — OMEPRAZOLE 20 MG PO CPDR: 20.0000 mg | DELAYED_RELEASE_CAPSULE | Freq: Every day | ORAL | Status: DC | PRN

## 2013-09-30 MED ORDER — HYDROCODONE-ACETAMINOPHEN 5-325 MG PO TABS: 1.0000 | ORAL_TABLET | Freq: Four times a day (QID) | ORAL | Status: DC | PRN

## 2013-09-30 MED ORDER — AMLODIPINE BESYLATE 5 MG PO TABS: 5.0000 mg | ORAL_TABLET | Freq: Every day | ORAL | Status: DC

## 2013-09-30 MED ORDER — AMILORIDE-HYDROCHLOROTHIAZIDE 5-50 MG PO TABS: 1.0000 | ORAL_TABLET | Freq: Every day | ORAL | Status: DC

## 2013-09-30 NOTE — Progress Notes (Signed)
Subjective:    Patient ID: Christopher Tran, male    DOB: 12-24-1925, 80 y.o.   MRN: [id number]  HPI  Here for yearly f/u;  Overall doing ok;  Pt denies CP, worsening SOB, DOE, wheezing, orthopnea, PND, worsening LE edema, palpitations, dizziness or syncope.  Pt denies neurological change such as new headache, facial or extremity weakness.  Pt denies polydipsia, polyuria, or low sugar symptoms. Pt states overall good compliance with treatment and medications, good tolerability, and has been trying to follow lower cholesterol diet.  Pt denies worsening depressive symptoms, suicidal ideation or panic. No fever, night sweats, wt loss, loss of appetite, or other constitutional symptoms.  Pt states good ability with ADL's, has low fall risk, home safety reviewed and adequate, no other significant changes in hearing or vision, and only occasionally active with exercise. Overall pain stable on current occas hydrocodone.  Did have transient double vision with fatigue for unclear reasons a few wks ago, improved, saw optho, no change in tx.  Denies worsening reflux, abd pain, dysphagia, n/v, bowel change or blood. Past Medical History  Diagnosis Date  . DEGENERATIVE JOINT DISEASE, LUMBAR SPINE 01/06/2007    Qualifier: Diagnosis of  By: Dance CMA (AAMA), Kim    . FASCIITIS, PLANTAR 01/06/2007    Qualifier: Diagnosis of  By: Dance CMA (AAMA), Kim    . HYPERTENSION 01/06/2007    Qualifier: Diagnosis of  By: Dance CMA (AAMA), Kim    . Other abnormal blood chemistry 02/03/2007    Qualifier: Diagnosis of  By: Debby BudNorins MD, Rosalyn GessMichael E    Past Surgical History  Procedure Laterality Date  . Tonsillectomy    . Plantar fascilitis    . Cataract extraction  2006    bilateral  . Blepharoplasty  2006    Bilateral  . Rotator cuff repair  2007    right    reports that he has quit smoking. He has never used smokeless tobacco. He reports that he drinks alcohol. He reports that he does not use illicit drugs. family history  includes Other in his brother, father, mother, and sister. There is no history of Prostate cancer or Colon cancer. No Known Allergies Current Outpatient Prescriptions on File Prior to Visit  Medication Sig Dispense Refill  . cholecalciferol (VITAMIN D) 1000 UNITS tablet Take 1,000 Units by mouth daily.      . Omega-3 Fatty Acids (FISH OIL) 1000 MG CAPS Take 1 capsule (1,000 mg total) by mouth daily.  90 capsule  3   No current facility-administered medications on file prior to visit.   Review of Systems  Constitutional: Negative for unusual diaphoresis or other sweats  HENT: Negative for ringing in ear Eyes: Negative for double vision or worsening visual disturbance.  Respiratory: Negative for choking and stridor.   Gastrointestinal: Negative for vomiting or other signifcant bowel change Genitourinary: Negative for hematuria or decreased urine volume.  Musculoskeletal: Negative for other MSK pain or swelling Skin: Negative for color change and worsening wound.  Neurological: Negative for tremors and numbness other than noted  Psychiatric/Behavioral: Negative for decreased concentration or agitation other than above       Objective:   Physical Exam BP 130/78  Pulse 97  Temp(Src) 98.2 F (36.8 C) (Oral)  Wt 173 lb (78.472 kg)  SpO2 99% VS noted,  Constitutional: Pt appears well-developed, well-nourished.  HENT: Head: NCAT.  Right Ear: External ear normal.  Left Ear: External ear normal.  Eyes: . Pupils are equal, round, and  reactive to light. Conjunctivae and EOM are normal Neck: Normal range of motion. Neck supple.  Cardiovascular: Normal rate and regular rhythm.   Pulmonary/Chest: Effort normal and breath sounds normal.  Abd:  Soft, NT, ND, + BS Neurological: Pt is alert. Not confused , motor grossly intact Skin: Skin is warm. No rash Psychiatric: Pt behavior is normal. No agitation.     Assessment & Plan:

## 2013-09-30 NOTE — Progress Notes (Signed)
Pre visit review using our clinic review tool, if applicable. No additional management support is needed unless otherwise documented below in the visit note. 

## 2013-09-30 NOTE — Patient Instructions (Addendum)
You had the new Prevnar pneumonia shot today  Please continue all other medications as before, and refills have been done if requested - the hydrocodone  Please have the pharmacy call with any other refills you may need.  Please continue your efforts at being more active, low cholesterol diet, and weight control.  You are otherwise up to date with prevention measures today.  Please keep your appointments with your specialists as you may have planned  Please go to the LAB in the Basement (turn left off the elevator) for the tests to be done today  You will be contacted by phone if any changes need to be made immediately.  Otherwise, you will receive a letter about your results with an explanation, but please check with MyChart first.  Please remember to sign up for MyChart if you have not done so, as this will be important to you in the future with finding out test results, communicating by private email, and scheduling acute appointments online when needed.  Please return in 6 months, or sooner if needed

## 2013-10-01 ENCOUNTER — Ambulatory Visit: Payer: Medicare Other

## 2013-10-01 DIAGNOSIS — R7309 Other abnormal glucose: Secondary | ICD-10-CM

## 2013-10-01 LAB — HEMOGLOBIN A1C: Hgb A1c MFr Bld: 5.8 % (ref 4.6–6.5)

## 2013-10-02 ENCOUNTER — Encounter: Payer: Self-pay | Admitting: Internal Medicine

## 2013-10-02 DIAGNOSIS — G894 Chronic pain syndrome: Secondary | ICD-10-CM | POA: Insufficient documentation

## 2013-10-02 DIAGNOSIS — I10 Essential (primary) hypertension: Secondary | ICD-10-CM | POA: Insufficient documentation

## 2013-10-02 DIAGNOSIS — K219 Gastro-esophageal reflux disease without esophagitis: Secondary | ICD-10-CM

## 2013-10-02 HISTORY — DX: Gastro-esophageal reflux disease without esophagitis: K21.9

## 2013-10-02 NOTE — Assessment & Plan Note (Signed)
stable overall by history and exam, recent data reviewed with pt, and pt to continue medical treatment as before,  to f/u any worsening symptoms or concerns Lab Results  Component Value Date   WBC 7.3 09/30/2013   HGB 14.2 09/30/2013   HCT 41.8 09/30/2013   PLT 224.0 09/30/2013   GLUCOSE 121* 09/30/2013   CHOL 168 09/30/2013   TRIG 74.0 09/30/2013   HDL 42.70 09/30/2013   LDLCALC 111* 09/30/2013   ALT 13 09/30/2013   AST 18 09/30/2013   NA 137 09/30/2013   K 3.5 09/30/2013   CL 102 09/30/2013   CREATININE 0.8 09/30/2013   BUN 17 09/30/2013   CO2 28 09/30/2013   TSH 1.80 09/30/2013   PSA 1.75 02/03/2007   HGBA1C 5.8 10/01/2013

## 2013-10-02 NOTE — Assessment & Plan Note (Signed)
stable overall by history and exam, recent data reviewed with pt, and pt to continue medical treatment as before,  to f/u any worsening symptoms or concerns BP Readings from Last 3 Encounters:  09/30/13 130/78  03/10/13 162/100  08/07/12 140/80

## 2013-10-02 NOTE — Assessment & Plan Note (Signed)
Stable on current low dose intermittent prn hydrocodone,  to f/u any worsening symptoms or concerns

## 2013-12-04 DIAGNOSIS — Z961 Presence of intraocular lens: Secondary | ICD-10-CM | POA: Diagnosis not present

## 2013-12-04 DIAGNOSIS — H18413 Arcus senilis, bilateral: Secondary | ICD-10-CM | POA: Diagnosis not present

## 2013-12-04 DIAGNOSIS — I10 Essential (primary) hypertension: Secondary | ICD-10-CM | POA: Diagnosis not present

## 2013-12-04 DIAGNOSIS — H532 Diplopia: Secondary | ICD-10-CM | POA: Diagnosis not present

## 2013-12-07 ENCOUNTER — Ambulatory Visit (INDEPENDENT_AMBULATORY_CARE_PROVIDER_SITE_OTHER): Payer: Medicare Other | Admitting: *Deleted

## 2013-12-07 DIAGNOSIS — Z23 Encounter for immunization: Secondary | ICD-10-CM

## 2013-12-09 ENCOUNTER — Other Ambulatory Visit: Payer: Self-pay | Admitting: Ophthalmology

## 2013-12-09 DIAGNOSIS — H532 Diplopia: Secondary | ICD-10-CM

## 2013-12-11 ENCOUNTER — Ambulatory Visit
Admission: RE | Admit: 2013-12-11 | Discharge: 2013-12-11 | Disposition: A | Discharge status: Home / Self Care | Payer: Medicare Other | Source: Ambulatory Visit | Attending: Ophthalmology | Admitting: Ophthalmology

## 2013-12-11 DIAGNOSIS — I6782 Cerebral ischemia: Secondary | ICD-10-CM | POA: Diagnosis not present

## 2013-12-11 DIAGNOSIS — Z9841 Cataract extraction status, right eye: Secondary | ICD-10-CM | POA: Diagnosis not present

## 2013-12-11 DIAGNOSIS — H532 Diplopia: Secondary | ICD-10-CM

## 2013-12-11 DIAGNOSIS — G319 Degenerative disease of nervous system, unspecified: Secondary | ICD-10-CM | POA: Diagnosis not present

## 2013-12-11 DIAGNOSIS — Z9842 Cataract extraction status, left eye: Secondary | ICD-10-CM | POA: Diagnosis not present

## 2013-12-11 DIAGNOSIS — S030XXA Dislocation of jaw, initial encounter: Secondary | ICD-10-CM | POA: Diagnosis not present

## 2014-01-11 DIAGNOSIS — H5022 Vertical strabismus, left eye: Secondary | ICD-10-CM | POA: Diagnosis not present

## 2014-01-19 ENCOUNTER — Other Ambulatory Visit: Payer: Self-pay | Admitting: Internal Medicine

## 2014-01-19 ENCOUNTER — Other Ambulatory Visit: Payer: Medicare Other

## 2014-01-19 DIAGNOSIS — H532 Diplopia: Secondary | ICD-10-CM | POA: Diagnosis not present

## 2014-01-21 LAB — ACETYLCHOLINE RECEPTOR, BINDING: A CHR BINDING ABS: <0.3 nmol/L (ref <=0.30)

## 2014-01-22 LAB — STRIATED MUSCLE ANTIBODY: Striated Muscle Ab: <1:40 {titer}

## 2014-02-17 DIAGNOSIS — H5022 Vertical strabismus, left eye: Secondary | ICD-10-CM | POA: Diagnosis not present

## 2014-03-18 DIAGNOSIS — H5022 Vertical strabismus, left eye: Secondary | ICD-10-CM | POA: Diagnosis not present

## 2014-04-06 ENCOUNTER — Ambulatory Visit (INDEPENDENT_AMBULATORY_CARE_PROVIDER_SITE_OTHER): Payer: Medicare Other | Admitting: Internal Medicine

## 2014-04-06 ENCOUNTER — Encounter: Payer: Self-pay | Admitting: Internal Medicine

## 2014-04-06 ENCOUNTER — Other Ambulatory Visit (INDEPENDENT_AMBULATORY_CARE_PROVIDER_SITE_OTHER): Payer: Medicare Other

## 2014-04-06 VITALS — BP 140/78 | HR 60 | Temp 98.5°F | Ht 66.0 in | Wt 175.0 lb

## 2014-04-06 DIAGNOSIS — I10 Essential (primary) hypertension: Secondary | ICD-10-CM

## 2014-04-06 DIAGNOSIS — R7302 Impaired glucose tolerance (oral): Secondary | ICD-10-CM

## 2014-04-06 DIAGNOSIS — G894 Chronic pain syndrome: Secondary | ICD-10-CM | POA: Diagnosis not present

## 2014-04-06 DIAGNOSIS — R7303 Prediabetes: Secondary | ICD-10-CM | POA: Insufficient documentation

## 2014-04-06 LAB — LIPID PANEL
Cholesterol: 166 mg/dL (ref 0–200)
HDL: 43.7 mg/dL (ref >39.00)
LDL CALC: 108 mg/dL — AB (ref 0–99)
NonHDL: 122.3
TRIGLYCERIDES: 71 mg/dL (ref 0.0–149.0)
Total CHOL/HDL Ratio: 4
VLDL: 14.2 mg/dL (ref 0.0–40.0)

## 2014-04-06 LAB — HEPATIC FUNCTION PANEL
ALT: 12 U/L (ref 0–53)
AST: 14 U/L (ref 0–37)
Albumin: 4.8 g/dL (ref 3.5–5.2)
Alkaline Phosphatase: 78 U/L (ref 39–117)
BILIRUBIN TOTAL: 1.4 mg/dL — AB (ref 0.2–1.2)
Bilirubin, Direct: 0.3 mg/dL (ref 0.0–0.3)
Total Protein: 7.6 g/dL (ref 6.0–8.3)

## 2014-04-06 LAB — BASIC METABOLIC PANEL
BUN: 22 mg/dL (ref 6–23)
CO2: 29 meq/L (ref 19–32)
Calcium: 10 mg/dL (ref 8.4–10.5)
Chloride: 99 mEq/L (ref 96–112)
Creatinine, Ser: 0.97 mg/dL (ref 0.40–1.50)
GFR: 77.88 mL/min (ref >60.00)
Glucose, Bld: 108 mg/dL — ABNORMAL HIGH (ref 70–99)
Potassium: 4.1 mEq/L (ref 3.5–5.1)
Sodium: 137 mEq/L (ref 135–145)

## 2014-04-06 LAB — HEMOGLOBIN A1C: HEMOGLOBIN A1C: 5.8 % (ref 4.6–6.5)

## 2014-04-06 MED ORDER — HYDROCODONE-ACETAMINOPHEN 5-325 MG PO TABS: 1.0000 | ORAL_TABLET | Freq: Four times a day (QID) | ORAL | Status: DC | PRN

## 2014-04-06 NOTE — Patient Instructions (Signed)

## 2014-04-06 NOTE — Progress Notes (Signed)
Subjective:    Patient ID: Christopher Tran, male    DOB: 1926-02-23, 81 y.o.   MRN: [id number]  HPI    Here to f/u; overall doing ok,  Pt denies chest pain, increased sob or doe, wheezing, orthopnea, PND, increased LE swelling, palpitations, dizziness or syncope.  Pt denies polydipsia, polyuria, or low sugar symptoms such as weakness or confusion improved with po intake.  Pt denies new neurological symptoms such as new headache, or facial or extremity weakness or numbness.   Pt states overall good compliance with meds, has been trying to follow lower cholesterol diet, with wt overall stable,  Trying to stay active Wt Readings from Last 3 Encounters:  04/06/14 175 lb (79.379 kg)  09/30/13 173 lb (78.472 kg)  03/10/13 172 lb (78.019 kg)  Did have significant double vision, had been checked several times per optho, had Head MRI - neg per pt. Then seen per Dr Maple HudsonYoung who normally sees pediatric vision issues but tx this pt with a prism to right lense, no help, recent labs nov  2015 neg (see this emr), then changed the prism which then resolved the double vision.  Pt continues to have recurring LBP without change in severity, bowel or bladder change, fever, wt loss,  worsening LE pain/numbness/weakness, gait change or falls - needs pain med refill Past Medical History  Diagnosis Date  . DEGENERATIVE JOINT DISEASE, LUMBAR SPINE 01/06/2007    Qualifier: Diagnosis of  By: Dance CMA (AAMA), Kim    . FASCIITIS, PLANTAR 01/06/2007    Qualifier: Diagnosis of  By: Dance CMA (AAMA), Kim    . HYPERTENSION 01/06/2007    Qualifier: Diagnosis of  By: Dance CMA (AAMA), Kim    . Other abnormal blood chemistry 02/03/2007    Qualifier: Diagnosis of  By: Debby BudNorins MD, Rosalyn GessMichael E   . GERD (gastroesophageal reflux disease) 10/02/2013   Past Surgical History  Procedure Laterality Date  . Tonsillectomy    . Plantar fascilitis    . Cataract extraction  2006    bilateral  . Blepharoplasty  2006    Bilateral  . Rotator  cuff repair  2007    right    reports that he has quit smoking. He has never used smokeless tobacco. He reports that he drinks alcohol. He reports that he does not use illicit drugs. family history includes Other in his brother, father, mother, and sister. There is no history of Prostate cancer or Colon cancer. No Known Allergies Current Outpatient Prescriptions on File Prior to Visit  Medication Sig Dispense Refill  . amiloride-hydrochlorothiazide (MODURETIC) 5-50 MG tablet Take 1 tablet by mouth daily. 90 tablet 3  . amLODipine (NORVASC) 5 MG tablet Take 1 tablet (5 mg total) by mouth daily. 90 tablet 3  . cholecalciferol (VITAMIN D) 1000 UNITS tablet Take 1,000 Units by mouth daily.    Marland Kitchen. HYDROcodone-acetaminophen (NORCO/VICODIN) 5-325 MG per tablet Take 1 tablet by mouth every 6 (six) hours as needed. Pain. 90 tablet 0  . Omega-3 Fatty Acids (FISH OIL) 1000 MG CAPS Take 1 capsule (1,000 mg total) by mouth daily. 90 capsule 3  . omeprazole (PRILOSEC) 20 MG capsule Take 1 capsule (20 mg total) by mouth daily as needed. 90 capsule 3   No current facility-administered medications on file prior to visit.    Review of Systems  Constitutional: Negative for unusual diaphoresis or other sweats  HENT: Negative for ringing in ear Eyes: Negative for double vision or worsening visual disturbance.  Respiratory: Negative for choking and stridor.   Gastrointestinal: Negative for vomiting or other signifcant bowel change Genitourinary: Negative for hematuria or decreased urine volume.  Musculoskeletal: Negative for other MSK pain or swelling Skin: Negative for color change and worsening wound.  Neurological: Negative for tremors and numbness other than noted  Psychiatric/Behavioral: Negative for decreased concentration or agitation other than above       Objective:   Physical Exam BP 140/78 mmHg  Pulse 60  Temp(Src) 98.5 F (36.9 C) (Oral)  Ht  (1.676 m)  Wt 175 lb (79.379 kg)  BMI  28.26 kg/m2  SpO2 97% VS noted,  Constitutional: Pt appears well-developed, well-nourished.  HENT: Head: NCAT.  Right Ear: External ear normal.  Left Ear: External ear normal.  Eyes: . Pupils are equal, round, and reactive to light. Conjunctivae and EOM are normal Neck: Normal range of motion. Neck supple.  Cardiovascular: Normal rate and regular rhythm.   Pulmonary/Chest: Effort normal and breath sounds without rales or wheezing.  Neurological: Pt is alert. Not confused , motor grossly intact Skin: Skin is warm. No rash Psychiatric: Pt behavior is normal. No agitation.  Spine nontender    Assessment & Plan:

## 2014-04-06 NOTE — Assessment & Plan Note (Signed)
stable overall by history and exam, recent data reviewed with pt, and pt to continue medical treatment as before,  to f/u any worsening symptoms or concerns Lab Results  Component Value Date   HGBA1C 5.8 10/01/2013   For f/u lab today

## 2014-04-06 NOTE — Assessment & Plan Note (Signed)
stable overall by history and exam, recent data reviewed with pt, and pt to continue medical treatment as before,  to f/u any worsening symptoms or concerns BP Readings from Last 3 Encounters:  04/06/14 140/78  09/30/13 130/78  03/10/13 162/100

## 2014-04-06 NOTE — Assessment & Plan Note (Signed)
Chronic recurrent back pain, stable, for pain med refill

## 2014-04-06 NOTE — Progress Notes (Signed)
Pre visit review using our clinic review tool, if applicable. No additional management support is needed unless otherwise documented below in the visit note. 

## 2014-09-09 ENCOUNTER — Other Ambulatory Visit: Payer: Self-pay | Admitting: Internal Medicine

## 2014-10-05 ENCOUNTER — Ambulatory Visit (INDEPENDENT_AMBULATORY_CARE_PROVIDER_SITE_OTHER): Payer: Medicare Other | Admitting: Internal Medicine

## 2014-10-05 ENCOUNTER — Encounter: Payer: Self-pay | Admitting: Internal Medicine

## 2014-10-05 ENCOUNTER — Other Ambulatory Visit (INDEPENDENT_AMBULATORY_CARE_PROVIDER_SITE_OTHER): Payer: Medicare Other

## 2014-10-05 VITALS — BP 122/72 | HR 59 | Temp 97.6°F | Ht 66.0 in | Wt 172.0 lb

## 2014-10-05 DIAGNOSIS — I10 Essential (primary) hypertension: Secondary | ICD-10-CM | POA: Diagnosis not present

## 2014-10-05 DIAGNOSIS — R7302 Impaired glucose tolerance (oral): Secondary | ICD-10-CM | POA: Diagnosis not present

## 2014-10-05 DIAGNOSIS — G894 Chronic pain syndrome: Secondary | ICD-10-CM | POA: Diagnosis not present

## 2014-10-05 LAB — CBC WITH DIFFERENTIAL/PLATELET
BASOS PCT: 0.4 % (ref 0.0–3.0)
Basophils Absolute: 0 10*3/uL (ref 0.0–0.1)
EOS ABS: 0.2 10*3/uL (ref 0.0–0.7)
Eosinophils Relative: 2.6 % (ref 0.0–5.0)
HEMATOCRIT: 42.8 % (ref 39.0–52.0)
HEMOGLOBIN: 14.5 g/dL (ref 13.0–17.0)
LYMPHS ABS: 1.2 10*3/uL (ref 0.7–4.0)
LYMPHS PCT: 14.3 % (ref 12.0–46.0)
MCHC: 33.8 g/dL (ref 30.0–36.0)
MCV: 92 fl (ref 78.0–100.0)
Monocytes Absolute: 0.6 10*3/uL (ref 0.1–1.0)
Monocytes Relative: 7.8 % (ref 3.0–12.0)
NEUTROS ABS: 6.1 10*3/uL (ref 1.4–7.7)
Neutrophils Relative %: 74.9 % (ref 43.0–77.0)
PLATELETS: 240 10*3/uL (ref 150.0–400.0)
RBC: 4.66 Mil/uL (ref 4.22–5.81)
RDW: 13.8 % (ref 11.5–15.5)
WBC: 8.2 10*3/uL (ref 4.0–10.5)

## 2014-10-05 LAB — HEPATIC FUNCTION PANEL
ALK PHOS: 62 U/L (ref 39–117)
ALT: 12 U/L (ref 0–53)
AST: 13 U/L (ref 0–37)
Albumin: 4.7 g/dL (ref 3.5–5.2)
BILIRUBIN DIRECT: 0.2 mg/dL (ref 0.0–0.3)
BILIRUBIN TOTAL: 1.4 mg/dL — AB (ref 0.2–1.2)
TOTAL PROTEIN: 7.4 g/dL (ref 6.0–8.3)

## 2014-10-05 LAB — HEMOGLOBIN A1C: HEMOGLOBIN A1C: 5.6 % (ref 4.6–6.5)

## 2014-10-05 LAB — URINALYSIS, ROUTINE W REFLEX MICROSCOPIC
Bilirubin Urine: NEGATIVE
HGB URINE DIPSTICK: NEGATIVE
Ketones, ur: NEGATIVE
LEUKOCYTES UA: NEGATIVE
Nitrite: NEGATIVE
RBC / HPF: NONE SEEN (ref 0–?)
Specific Gravity, Urine: 1.01 (ref 1.000–1.030)
Total Protein, Urine: NEGATIVE
UROBILINOGEN UA: 0.2 (ref 0.0–1.0)
Urine Glucose: NEGATIVE
WBC, UA: NONE SEEN (ref 0–?)
pH: 6.5 (ref 5.0–8.0)

## 2014-10-05 LAB — BASIC METABOLIC PANEL
BUN: 20 mg/dL (ref 6–23)
CALCIUM: 9.9 mg/dL (ref 8.4–10.5)
CO2: 32 mEq/L (ref 19–32)
Chloride: 99 mEq/L (ref 96–112)
Creatinine, Ser: 0.89 mg/dL (ref 0.40–1.50)
GFR: 85.92 mL/min (ref >60.00)
Glucose, Bld: 104 mg/dL — ABNORMAL HIGH (ref 70–99)
POTASSIUM: 3.9 meq/L (ref 3.5–5.1)
SODIUM: 138 meq/L (ref 135–145)

## 2014-10-05 LAB — TSH: TSH: 1.99 u[IU]/mL (ref 0.35–4.50)

## 2014-10-05 LAB — LIPID PANEL
Cholesterol: 179 mg/dL (ref 0–200)
HDL: 46 mg/dL (ref >39.00)
LDL Cholesterol: 110 mg/dL — ABNORMAL HIGH (ref 0–99)
NonHDL: 133.02
TRIGLYCERIDES: 113 mg/dL (ref 0.0–149.0)
Total CHOL/HDL Ratio: 4
VLDL: 22.6 mg/dL (ref 0.0–40.0)

## 2014-10-05 MED ORDER — OMEPRAZOLE 20 MG PO CPDR: 20.0000 mg | DELAYED_RELEASE_CAPSULE | Freq: Every day | ORAL | Status: DC | PRN

## 2014-10-05 MED ORDER — HYDROCODONE-ACETAMINOPHEN 5-325 MG PO TABS: 1.0000 | ORAL_TABLET | Freq: Four times a day (QID) | ORAL | Status: DC | PRN

## 2014-10-05 NOTE — Patient Instructions (Signed)

## 2014-10-05 NOTE — Progress Notes (Signed)
Subjective:    Patient ID: Christopher Tran, male    DOB: 06/01/1925, 81 y.o.   MRN: [id number]  HPI  Here for yearly f/u;  Overall doing ok;  Pt denies Chest pain, worsening SOB, DOE, wheezing, orthopnea, PND, worsening LE edema, palpitations, dizziness or syncope.  Pt denies neurological change such as new headache, facial or extremity weakness.  Pt denies polydipsia, polyuria, or low sugar symptoms. Pt states overall good compliance with treatment and medications, good tolerability, and has been trying to follow appropriate diet.  Pt denies worsening depressive symptoms, suicidal ideation or panic. No fever, night sweats, wt loss, loss of appetite, or other constitutional symptoms.  Pt states good ability with ADL's, has low fall risk, home safety reviewed and adequate, no other significant changes in hearing or vision, and only occasionally active with exercise.  Has seen optho with double vision, now improved with prism lens on the right  /Pt continues to have recurring LBP without change in severity, bowel or bladder change, fever, wt loss,  worsening LE pain/numbness/weakness, gait change or falls. Past Medical History  Diagnosis Date  . DEGENERATIVE JOINT DISEASE, LUMBAR SPINE 01/06/2007    Qualifier: Diagnosis of  By: Dance CMA (AAMA), Kim    . FASCIITIS, PLANTAR 01/06/2007    Qualifier: Diagnosis of  By: Dance CMA (AAMA), Kim    . HYPERTENSION 01/06/2007    Qualifier: Diagnosis of  By: Dance CMA (AAMA), Kim    . Other abnormal blood chemistry 02/03/2007    Qualifier: Diagnosis of  By: Debby Bud MD, Rosalyn Gess   . GERD (gastroesophageal reflux disease) 10/02/2013   Past Surgical History  Procedure Laterality Date  . Tonsillectomy    . Plantar fascilitis    . Cataract extraction  2006    bilateral  . Blepharoplasty  2006    Bilateral  . Rotator cuff repair  2007    right    reports that he has quit smoking. He has never used smokeless tobacco. He reports that he drinks alcohol. He  reports that he does not use illicit drugs. family history includes Other in his brother, father, mother, and sister. There is no history of Prostate cancer or Colon cancer. No Known Allergies Current Outpatient Prescriptions on File Prior to Visit  Medication Sig Dispense Refill  . amiloride-hydrochlorothiazide (MODURETIC) 5-50 MG tablet TAKE 1 TABLET EVERY DAY 90 tablet 1  . amLODipine (NORVASC) 5 MG tablet TAKE 1 TABLET EVERY DAY 90 tablet 1  . cholecalciferol (VITAMIN D) 1000 UNITS tablet Take 1,000 Units by mouth daily.    Marland Kitchen HYDROcodone-acetaminophen (NORCO/VICODIN) 5-325 MG per tablet Take 1 tablet by mouth every 6 (six) hours as needed. Pain. 90 tablet 0  . Omega-3 Fatty Acids (FISH OIL) 1000 MG CAPS Take 1 capsule (1,000 mg total) by mouth daily. 90 capsule 3   No current facility-administered medications on file prior to visit.    Review of Systems  Constitutional: Negative for increased diaphoresis, other activity, appetite or siginficant weight change other than noted HENT: Negative for worsening hearing loss, ear pain, facial swelling, mouth sores and neck stiffness.   Eyes: Negative for other worsening pain, redness or visual disturbance.  Respiratory: Negative for shortness of breath and wheezing  Cardiovascular: Negative for chest pain and palpitations.  Gastrointestinal: Negative for diarrhea, blood in stool, abdominal distention or other pain Genitourinary: Negative for hematuria, flank pain or change in urine volume.  Musculoskeletal: Negative for myalgias or other joint complaints.  Skin: Negative  for color change and wound or drainage.  Neurological: Negative for syncope and numbness. other than noted Hematological: Negative for adenopathy. or other swelling Psychiatric/Behavioral: Negative for hallucinations, SI, self-injury, decreased concentration or other worsening agitation.      Objective:   Physical Exam BP 122/72 mmHg  Pulse 59  Temp(Src) 97.6 F (36.4 C)  (Oral)  Ht 5\' 6"  (1.676 m)  Wt 172 lb (78.019 kg)  BMI 27.77 kg/m2  SpO2 97% VS noted,  Constitutional: Pt is oriented to person, place, and time. Appears well-developed and well-nourished, in no significant distress Head: Normocephalic and atraumatic.  Right Ear: External ear normal.  Left Ear: External ear normal.  Nose: Nose normal.  Mouth/Throat: Oropharynx is clear and moist.  Eyes: Conjunctivae and EOM are normal. Pupils are equal, round, and reactive to light.  Neck: Normal range of motion. Neck supple. No JVD present. No tracheal deviation present or significant neck LA or mass Cardiovascular: Normal rate, regular rhythm, normal heart sounds and intact distal pulses.   Pulmonary/Chest: Effort normal and breath sounds without rales or wheezing  Abdominal: Soft. Bowel sounds are normal. NT. No HSM  Musculoskeletal: Normal range of motion. Exhibits no edema.  Lymphadenopathy:  Has no cervical adenopathy.  Neurological: Pt is alert and oriented to person, place, and time. Pt has normal reflexes. No cranial nerve deficit. Motor grossly intact Skin: Skin is warm and dry. No rash noted.  Psychiatric:  Has normal mood and affect. Behavior is normal.      Assessment & Plan:

## 2014-10-05 NOTE — Progress Notes (Signed)
Pre visit review using our clinic review tool, if applicable. No additional management support is needed unless otherwise documented below in the visit note. 

## 2014-10-05 NOTE — Assessment & Plan Note (Signed)
Stable, Ok for pain med refill,  to f/u any worsening symptoms or concerns

## 2014-10-05 NOTE — Assessment & Plan Note (Signed)
stable overall by history and exam, recent data reviewed with pt, and pt to continue medical treatment as before,  to f/u any worsening symptoms or concerns Lab Results  Component Value Date   HGBA1C 5.8 04/06/2014   For /fu labs today

## 2014-10-05 NOTE — Assessment & Plan Note (Signed)
stable overall by history and exam, recent data reviewed with pt, and pt to continue medical treatment as before,  to f/u any worsening symptoms or concerns BP Readings from Last 3 Encounters:  10/05/14 122/72  04/06/14 140/78  09/30/13 130/78

## 2014-12-24 ENCOUNTER — Ambulatory Visit (INDEPENDENT_AMBULATORY_CARE_PROVIDER_SITE_OTHER): Payer: Medicare Other

## 2014-12-24 DIAGNOSIS — Z23 Encounter for immunization: Secondary | ICD-10-CM

## 2015-04-07 ENCOUNTER — Other Ambulatory Visit (INDEPENDENT_AMBULATORY_CARE_PROVIDER_SITE_OTHER): Payer: Medicare Other

## 2015-04-07 ENCOUNTER — Ambulatory Visit (INDEPENDENT_AMBULATORY_CARE_PROVIDER_SITE_OTHER): Payer: Medicare Other | Admitting: Internal Medicine

## 2015-04-07 ENCOUNTER — Encounter: Payer: Self-pay | Admitting: Internal Medicine

## 2015-04-07 VITALS — BP 126/76 | HR 92 | Temp 98.7°F | Resp 20 | Ht 66.0 in | Wt 168.0 lb

## 2015-04-07 DIAGNOSIS — K219 Gastro-esophageal reflux disease without esophagitis: Secondary | ICD-10-CM

## 2015-04-07 DIAGNOSIS — I10 Essential (primary) hypertension: Secondary | ICD-10-CM

## 2015-04-07 DIAGNOSIS — R7302 Impaired glucose tolerance (oral): Secondary | ICD-10-CM

## 2015-04-07 DIAGNOSIS — G894 Chronic pain syndrome: Secondary | ICD-10-CM

## 2015-04-07 LAB — CBC WITH DIFFERENTIAL/PLATELET
Basophils Absolute: 0.1 10*3/uL (ref 0.0–0.1)
Basophils Relative: 0.7 % (ref 0.0–3.0)
Eosinophils Absolute: 0.3 10*3/uL (ref 0.0–0.7)
Eosinophils Relative: 3.6 % (ref 0.0–5.0)
HCT: 43.9 % (ref 39.0–52.0)
HEMOGLOBIN: 14.6 g/dL (ref 13.0–17.0)
Lymphocytes Relative: 13.1 % (ref 12.0–46.0)
Lymphs Abs: 1.1 10*3/uL (ref 0.7–4.0)
MCHC: 33.1 g/dL (ref 30.0–36.0)
MCV: 93 fl (ref 78.0–100.0)
MONO ABS: 0.6 10*3/uL (ref 0.1–1.0)
Monocytes Relative: 7.1 % (ref 3.0–12.0)
NEUTROS PCT: 75.5 % (ref 43.0–77.0)
Neutro Abs: 6.3 10*3/uL (ref 1.4–7.7)
Platelets: 268 10*3/uL (ref 150.0–400.0)
RBC: 4.72 Mil/uL (ref 4.22–5.81)
RDW: 13.6 % (ref 11.5–15.5)
WBC: 8.3 10*3/uL (ref 4.0–10.5)

## 2015-04-07 LAB — LIPID PANEL
Cholesterol: 153 mg/dL (ref 0–200)
HDL: 36.8 mg/dL — AB (ref >39.00)
LDL Cholesterol: 103 mg/dL — ABNORMAL HIGH (ref 0–99)
NonHDL: 116.5
TRIGLYCERIDES: 70 mg/dL (ref 0.0–149.0)
Total CHOL/HDL Ratio: 4
VLDL: 14 mg/dL (ref 0.0–40.0)

## 2015-04-07 LAB — BASIC METABOLIC PANEL
BUN: 15 mg/dL (ref 6–23)
CHLORIDE: 101 meq/L (ref 96–112)
CO2: 29 mEq/L (ref 19–32)
Calcium: 9.9 mg/dL (ref 8.4–10.5)
Creatinine, Ser: 0.9 mg/dL (ref 0.40–1.50)
GFR: 84.72 mL/min (ref >60.00)
GLUCOSE: 115 mg/dL — AB (ref 70–99)
POTASSIUM: 3.8 meq/L (ref 3.5–5.1)
Sodium: 139 mEq/L (ref 135–145)

## 2015-04-07 LAB — URINALYSIS, ROUTINE W REFLEX MICROSCOPIC
BILIRUBIN URINE: NEGATIVE
Hgb urine dipstick: NEGATIVE
Ketones, ur: NEGATIVE
Leukocytes, UA: NEGATIVE
Nitrite: NEGATIVE
RBC / HPF: NONE SEEN (ref 0–?)
Specific Gravity, Urine: <=1.005 — AB (ref 1.000–1.030)
TOTAL PROTEIN, URINE-UPE24: NEGATIVE
URINE GLUCOSE: NEGATIVE
UROBILINOGEN UA: 1 (ref 0.0–1.0)
WBC, UA: NONE SEEN (ref 0–?)
pH: 7.5 (ref 5.0–8.0)

## 2015-04-07 LAB — TSH: TSH: 1.67 u[IU]/mL (ref 0.35–4.50)

## 2015-04-07 LAB — HEPATIC FUNCTION PANEL
ALBUMIN: 4.4 g/dL (ref 3.5–5.2)
ALT: 10 U/L (ref 0–53)
AST: 12 U/L (ref 0–37)
Alkaline Phosphatase: 82 U/L (ref 39–117)
Bilirubin, Direct: 0.2 mg/dL (ref 0.0–0.3)
Total Bilirubin: 1.1 mg/dL (ref 0.2–1.2)
Total Protein: 7.6 g/dL (ref 6.0–8.3)

## 2015-04-07 LAB — HEMOGLOBIN A1C: Hgb A1c MFr Bld: 5.6 % (ref 4.6–6.5)

## 2015-04-07 MED ORDER — HYDROCODONE-ACETAMINOPHEN 7.5-325 MG PO TABS: 1.0000 | ORAL_TABLET | Freq: Four times a day (QID) | ORAL | Status: DC | PRN

## 2015-04-07 NOTE — Progress Notes (Signed)
Pre visit review using our clinic review tool, if applicable. No additional management support is needed unless otherwise documented below in the visit note. 

## 2015-04-07 NOTE — Patient Instructions (Addendum)
Please take all new medication as prescribed - the hydrocodone 7.5's  Please continue all other medications as before  Please have the pharmacy call with any other refills you may need.  Please continue your efforts at being more active, low cholesterol diet, and weight control.  You are otherwise up to date with prevention measures today.  Please keep your appointments with your specialists as you may have planned  Please go to the LAB in the Basement (turn left off the elevator) for the tests to be done today  You will be contacted by phone if any changes need to be made immediately.  Otherwise, you will receive a letter about your results with an explanation, but please check with MyChart first.  Please remember to sign up for MyChart if you have not done so, as this will be important to you in the future with finding out test results, communicating by private email, and scheduling acute appointments online when needed.  Please return in 6 months, or sooner if needed

## 2015-04-07 NOTE — Progress Notes (Signed)
Subjective:    Patient ID: Christopher Tran, male    DOB: 1925/08/18, 82 y.o.   MRN: [id number]  HPI  Here for yearly f/u;  Overall doing ok;  Pt denies Chest pain, worsening SOB, DOE, wheezing, orthopnea, PND, worsening LE edema, palpitations, dizziness or syncope.  Pt denies neurological change such as new headache, facial or extremity weakness.  Pt denies polydipsia, polyuria, or low sugar symptoms. Pt states overall good compliance with treatment and medications, good tolerability, and has been trying to follow appropriate diet.  Pt denies worsening depressive symptoms, suicidal ideation or panic. No fever, night sweats, wt loss, loss of appetite, or other constitutional symptoms.  Pt states good ability with ADL's, has low fall risk, home safety reviewed and adequate, no other significant changes in hearing or vision, and only occasionally active with exercise. Has some intermittent arthritic pain to hand and ankles, other joints. Current vicidin 5/325 does not help when needs only occas/rarely. Wife takes the vicidin 7.5's..  Wife also ill recently with stomach flu with vomiting and dairrhea, which he has avoided, but last 3 days has had new onset nonprod cough, feeling weaker, but today feels better with less cough/. Wt Readings from Last 3 Encounters:  04/07/15 168 lb (76.204 kg)  10/05/14 172 lb (78.019 kg)  04/06/14 175 lb (79.379 kg)   BP Readings from Last 3 Encounters:  04/07/15 126/76  10/05/14 122/72  04/06/14 140/78  Denies worsening reflux, abd pain, dysphagia, n/v, bowel change or blood. Past Medical History  Diagnosis Date  . DEGENERATIVE JOINT DISEASE, LUMBAR SPINE 01/06/2007    Qualifier: Diagnosis of  By: Dance CMA (AAMA), Kim    . FASCIITIS, PLANTAR 01/06/2007    Qualifier: Diagnosis of  By: Dance CMA (AAMA), Kim    . HYPERTENSION 01/06/2007    Qualifier: Diagnosis of  By: Dance CMA (AAMA), Kim    . Other abnormal blood chemistry 02/03/2007    Qualifier: Diagnosis of   By: Debby Bud MD, Rosalyn Gess   . GERD (gastroesophageal reflux disease) 10/02/2013   Past Surgical History  Procedure Laterality Date  . Tonsillectomy    . Plantar fascilitis    . Cataract extraction  2006    bilateral  . Blepharoplasty  2006    Bilateral  . Rotator cuff repair  2007    right    reports that he has quit smoking. He has never used smokeless tobacco. He reports that he drinks alcohol. He reports that he does not use illicit drugs. family history includes Other in his brother, father, mother, and sister. There is no history of Prostate cancer or Colon cancer. No Known Allergies Current Outpatient Prescriptions on File Prior to Visit  Medication Sig Dispense Refill  . amiloride-hydrochlorothiazide (MODURETIC) 5-50 MG tablet TAKE 1 TABLET EVERY DAY 90 tablet 1  . amLODipine (NORVASC) 5 MG tablet TAKE 1 TABLET EVERY DAY 90 tablet 1  . cholecalciferol (VITAMIN D) 1000 UNITS tablet Take 1,000 Units by mouth daily.    . Omega-3 Fatty Acids (FISH OIL) 1000 MG CAPS Take 1 capsule (1,000 mg total) by mouth daily. 90 capsule 3  . omeprazole (PRILOSEC) 20 MG capsule Take 1 capsule (20 mg total) by mouth daily as needed. 90 capsule 3   No current facility-administered medications on file prior to visit.     Review of Systems Constitutional: Negative for increased diaphoresis, other activity, appetite or siginficant weight change other than noted HENT: Negative for worsening hearing loss, ear pain, facial  swelling, mouth sores and neck stiffness.   Eyes: Negative for other worsening pain, redness or visual disturbance.  Respiratory: Negative for shortness of breath and wheezing  Cardiovascular: Negative for chest pain and palpitations.  Gastrointestinal: Negative for diarrhea, blood in stool, abdominal distention or other pain Genitourinary: Negative for hematuria, flank pain or change in urine volume.  Musculoskeletal: Negative for myalgias or other joint complaints.  Skin:  Negative for color change and wound or drainage.  Neurological: Negative for syncope and numbness. other than noted Hematological: Negative for adenopathy. or other swelling Psychiatric/Behavioral: Negative for hallucinations, SI, self-injury, decreased concentration or other worsening agitation.      Objective:   Physical Exam BP 126/76 mmHg  Pulse 92  Temp(Src) 98.7 F (37.1 C) (Oral)  Resp 20  Ht  (1.676 m)  Wt 168 lb (76.204 kg)  BMI 27.13 kg/m2  SpO2 97%  VS noted,  Constitutional: Pt is oriented to person, place, and time. Appears well-developed and well-nourished, in no significant distress Head: Normocephalic and atraumatic.  Right Ear: External ear normal.  Left Ear: External ear normal.  Nose: Nose normal.  Mouth/Throat: Oropharynx is clear and moist.  Eyes: Conjunctivae and EOM are normal. Pupils are equal, round, and reactive to light.  Neck: Normal range of motion. Neck supple. No JVD present. No tracheal deviation present or significant neck LA or mass Cardiovascular: Normal rate, regular rhythm, normal heart sounds and intact distal pulses.   Pulmonary/Chest: Effort normal and breath sounds without rales or wheezing  Abdominal: Soft. Bowel sounds are normal. NT. No HSM  Musculoskeletal: Normal range of motion. Exhibits no edema.  Lymphadenopathy:  Has no cervical adenopathy.  Neurological: Pt is alert and oriented to person, place, and time. Pt has normal reflexes. No cranial nerve deficit. Motor grossly intact Skin: Skin is warm and dry. No rash noted.  Psychiatric:  Has normal mood and affect. Behavior is normal.    Assessment & Plan:

## 2015-04-08 ENCOUNTER — Encounter: Payer: Self-pay | Admitting: Internal Medicine

## 2015-04-10 NOTE — Assessment & Plan Note (Signed)
Overall mild uncontrolled subjectively, for hydrocodone prn as rx,  to f/u any worsening symptoms or concerns

## 2015-04-10 NOTE — Assessment & Plan Note (Signed)
stable overall by history and exam, recent data reviewed with pt, and pt to continue medical treatment as before,  to f/u any worsening symptoms or concerns Lab Results  Component Value Date   HGBA1C 5.6 04/07/2015

## 2015-04-10 NOTE — Assessment & Plan Note (Signed)
stable overall by history and exam, recent data reviewed with pt, and pt to continue medical treatment as before,  to f/u any worsening symptoms or concerns Lab Results  Component Value Date   WBC 8.3 04/07/2015   HGB 14.6 04/07/2015   HCT 43.9 04/07/2015   PLT 268.0 04/07/2015   GLUCOSE 115* 04/07/2015   CHOL 153 04/07/2015   TRIG 70.0 04/07/2015   HDL 36.80* 04/07/2015   LDLCALC 103* 04/07/2015   ALT 10 04/07/2015   AST 12 04/07/2015   NA 139 04/07/2015   K 3.8 04/07/2015   CL 101 04/07/2015   CREATININE 0.90 04/07/2015   BUN 15 04/07/2015   CO2 29 04/07/2015   TSH 1.67 04/07/2015   PSA 1.75 02/03/2007   HGBA1C 5.6 04/07/2015

## 2015-04-10 NOTE — Assessment & Plan Note (Signed)
stable overall by history and exam, recent data reviewed with pt, and pt to continue medical treatment as before,  to f/u any worsening symptoms or concerns BP Readings from Last 3 Encounters:  04/07/15 126/76  10/05/14 122/72  04/06/14 140/78

## 2015-04-13 ENCOUNTER — Other Ambulatory Visit: Payer: Self-pay | Admitting: Internal Medicine

## 2015-08-16 ENCOUNTER — Other Ambulatory Visit: Payer: Self-pay | Admitting: Internal Medicine

## 2015-09-29 DIAGNOSIS — H5021 Vertical strabismus, right eye: Secondary | ICD-10-CM | POA: Diagnosis not present

## 2015-09-29 DIAGNOSIS — H5032 Intermittent alternating esotropia: Secondary | ICD-10-CM | POA: Diagnosis not present

## 2015-10-05 ENCOUNTER — Ambulatory Visit (INDEPENDENT_AMBULATORY_CARE_PROVIDER_SITE_OTHER): Payer: Medicare Other | Admitting: Internal Medicine

## 2015-10-05 ENCOUNTER — Encounter: Payer: Self-pay | Admitting: Internal Medicine

## 2015-10-05 DIAGNOSIS — I10 Essential (primary) hypertension: Secondary | ICD-10-CM | POA: Diagnosis not present

## 2015-10-05 DIAGNOSIS — R7302 Impaired glucose tolerance (oral): Secondary | ICD-10-CM | POA: Diagnosis not present

## 2015-10-05 DIAGNOSIS — K219 Gastro-esophageal reflux disease without esophagitis: Secondary | ICD-10-CM | POA: Diagnosis not present

## 2015-10-05 DIAGNOSIS — G894 Chronic pain syndrome: Secondary | ICD-10-CM | POA: Diagnosis not present

## 2015-10-05 MED ORDER — HYDROCODONE-ACETAMINOPHEN 7.5-325 MG PO TABS: 1.0000 | ORAL_TABLET | Freq: Four times a day (QID) | ORAL | 0 refills | Status: DC | PRN

## 2015-10-05 NOTE — Progress Notes (Signed)
Subjective:    Patient ID: Christopher Tran, male    DOB: 1925/07/26, 82 y.o.   MRN: [id number]  HPI    Here to f/u; overall doing ok,  Pt denies chest pain, increasing sob or doe, wheezing, orthopnea, PND, increased LE swelling, palpitations, dizziness or syncope.  Pt denies new neurological symptoms such as new headache, or facial or extremity weakness or numbness, except has been somewhat more unstable and off balance with walking.  No falls,  Not bad enough for a cane but does not use for now..  Pt denies polydipsia, polyuria, or low sugar episode.   Pt denies new neurological symptoms such as new headache, or facial or extremity weakness or numbness.   Pt states overall good compliance with meds, mostly trying to follow appropriate diet, with wt overall stable,  but little exercise however. Pt continues to have recurring LBP without change in severity, bowel or bladder change, fever, wt loss,  worsening LE pain/numbness/weakness, gait change or falls. Asks for pain med refill  Also Denies worsening reflux, abd pain, dysphagia, n/v, bowel change or blood. Wt Readings from Last 3 Encounters:  10/05/15 166 lb (75.3 kg)  04/07/15 168 lb (76.2 kg)  10/05/14 172 lb (78 kg)   BP Readings from Last 3 Encounters:  10/05/15 124/70  04/07/15 126/76  10/05/14 122/72   Past Medical History:  Diagnosis Date  . DEGENERATIVE JOINT DISEASE, LUMBAR SPINE 01/06/2007   Qualifier: Diagnosis of  By: Dance CMA (AAMA), Kim    . FASCIITIS, PLANTAR 01/06/2007   Qualifier: Diagnosis of  By: Dance CMA (AAMA), Kim    . GERD (gastroesophageal reflux disease) 10/02/2013  . HYPERTENSION 01/06/2007   Qualifier: Diagnosis of  By: Dance CMA (AAMA), Kim    . Other abnormal blood chemistry 02/03/2007   Qualifier: Diagnosis of  By: Debby Bud MD, Rosalyn Gess    Past Surgical History:  Procedure Laterality Date  . BLEPHAROPLASTY  2006   Bilateral  . CATARACT EXTRACTION  2006   bilateral  . plantar fascilitis    . ROTATOR  CUFF REPAIR  2007   right  . TONSILLECTOMY      reports that he has quit smoking. He has never used smokeless tobacco. He reports that he drinks alcohol. He reports that he does not use drugs. family history includes Other in his brother, father, mother, and sister. No Known Allergies Current Outpatient Prescriptions on File Prior to Visit  Medication Sig Dispense Refill  . amiloride-hydrochlorothiazide (MODURETIC) 5-50 MG tablet TAKE 1 TABLET EVERY DAY 90 tablet 1  . amLODipine (NORVASC) 5 MG tablet TAKE 1 TABLET EVERY DAY 90 tablet 1  . cholecalciferol (VITAMIN D) 1000 UNITS tablet Take 1,000 Units by mouth daily.    Marland Kitchen HYDROcodone-acetaminophen (NORCO) 7.5-325 MG tablet Take 1 tablet by mouth every 6 (six) hours as needed for moderate pain. To fill Jul 04, 2015 120 tablet 0  . Omega-3 Fatty Acids (FISH OIL) 1000 MG CAPS Take 1 capsule (1,000 mg total) by mouth daily. 90 capsule 3  . omeprazole (PRILOSEC) 20 MG capsule TAKE 1 CAPSULE EVERY DAY AS NEEDED 90 capsule 3   No current facility-administered medications on file prior to visit.     Review of Systems  Constitutional: Negative for unusual diaphoresis or night sweats HENT: Negative for ear swelling or discharge Eyes: Negative for worsening visual haziness  Respiratory: Negative for choking and stridor.   Gastrointestinal: Negative for distension or worsening eructation Genitourinary: Negative for retention or  change in urine volume.  Musculoskeletal: Negative for other MSK pain or swelling Skin: Negative for color change and worsening wound Neurological: Negative for tremors and numbness other than noted  Psychiatric/Behavioral: Negative for decreased concentration or agitation other than above       Objective:   Physical Exam BP 124/70   Pulse 88   Temp 98.4 F (36.9 C) (Oral)   Resp 20   Wt 166 lb (75.3 kg)   SpO2 93%   BMI 26.79 kg/m  VS noted,  Constitutional: Pt appears in no apparent distress HENT: Head: NCAT.    Right Ear: External ear normal.  Left Ear: External ear normal.  Eyes: . Pupils are equal, round, and reactive to light. Conjunctivae and EOM are normal Neck: Normal range of motion. Neck supple.  Cardiovascular: Normal rate and regular rhythm.   Pulmonary/Chest: Effort normal and breath sounds without rales or wheezing.  Abd:  Soft, NT, ND, + BS Neurological: Pt is alert. Not confused , motor grossly intact Skin: Skin is warm. No rash, no LE edema Psychiatric: Pt behavior is normal. No agitation.   Lab Results  Component Value Date   WBC 8.3 04/07/2015   HGB 14.6 04/07/2015   HCT 43.9 04/07/2015   PLT 268.0 04/07/2015   GLUCOSE 115 (H) 04/07/2015   CHOL 153 04/07/2015   TRIG 70.0 04/07/2015   HDL 36.80 (L) 04/07/2015   LDLCALC 103 (H) 04/07/2015   ALT 10 04/07/2015   AST 12 04/07/2015   NA 139 04/07/2015   K 3.8 04/07/2015   CL 101 04/07/2015   CREATININE 0.90 04/07/2015   BUN 15 04/07/2015   CO2 29 04/07/2015   TSH 1.67 04/07/2015   PSA 1.75 02/03/2007   HGBA1C 5.6 04/07/2015       Assessment & Plan:

## 2015-10-05 NOTE — Progress Notes (Signed)
Pre visit review using our clinic review tool, if applicable. No additional management support is needed unless otherwise documented below in the visit note. 

## 2015-10-05 NOTE — Patient Instructions (Signed)
Please continue all other medications as before, and refills have been done if requested.  Please have the pharmacy call with any other refills you may need.  Please continue your efforts at being more active, low cholesterol diet, and weight control.  You are otherwise up to date with prevention measures today.  Please keep your appointments with your specialists as you may have planned  Please return in 6 months, or sooner if needed 

## 2015-10-09 NOTE — Assessment & Plan Note (Signed)
stable overall by history and exam, recent data reviewed with pt, and pt to continue medical treatment as before,  to f/u any worsening symptoms or concerns BP Readings from Last 3 Encounters:  10/05/15 124/70  04/07/15 126/76  10/05/14 122/72

## 2015-10-09 NOTE — Assessment & Plan Note (Signed)
Lab Results  Component Value Date   HGBA1C 5.6 04/07/2015   stable overall by history and exam, recent data reviewed with pt, and pt to continue medical treatment as before,  to f/u any worsening symptoms or concerns

## 2015-10-09 NOTE — Assessment & Plan Note (Signed)
Chronic stable, for pain med refill 

## 2015-10-09 NOTE — Assessment & Plan Note (Signed)
Stable,  to f/u any worsening symptoms or concerns 

## 2015-11-10 ENCOUNTER — Ambulatory Visit (INDEPENDENT_AMBULATORY_CARE_PROVIDER_SITE_OTHER): Payer: Medicare Other

## 2015-11-10 DIAGNOSIS — Z23 Encounter for immunization: Secondary | ICD-10-CM

## 2016-03-16 ENCOUNTER — Other Ambulatory Visit: Payer: Self-pay | Admitting: Internal Medicine

## 2016-04-26 ENCOUNTER — Ambulatory Visit (INDEPENDENT_AMBULATORY_CARE_PROVIDER_SITE_OTHER)
Admission: RE | Admit: 2016-04-26 | Discharge: 2016-04-26 | Disposition: A | Discharge status: Home / Self Care | Payer: Medicare Other | Source: Ambulatory Visit | Attending: Internal Medicine | Admitting: Internal Medicine

## 2016-04-26 ENCOUNTER — Other Ambulatory Visit (INDEPENDENT_AMBULATORY_CARE_PROVIDER_SITE_OTHER): Payer: Medicare Other

## 2016-04-26 ENCOUNTER — Encounter: Payer: Self-pay | Admitting: Internal Medicine

## 2016-04-26 ENCOUNTER — Ambulatory Visit (INDEPENDENT_AMBULATORY_CARE_PROVIDER_SITE_OTHER): Payer: Medicare Other | Admitting: Internal Medicine

## 2016-04-26 VITALS — BP 140/82 | HR 86 | Temp 97.2°F | Ht 67.5 in | Wt 169.0 lb

## 2016-04-26 DIAGNOSIS — R05 Cough: Secondary | ICD-10-CM

## 2016-04-26 DIAGNOSIS — G894 Chronic pain syndrome: Secondary | ICD-10-CM | POA: Diagnosis not present

## 2016-04-26 DIAGNOSIS — I10 Essential (primary) hypertension: Secondary | ICD-10-CM

## 2016-04-26 DIAGNOSIS — R7302 Impaired glucose tolerance (oral): Secondary | ICD-10-CM

## 2016-04-26 DIAGNOSIS — R011 Cardiac murmur, unspecified: Secondary | ICD-10-CM

## 2016-04-26 DIAGNOSIS — R059 Cough, unspecified: Secondary | ICD-10-CM | POA: Insufficient documentation

## 2016-04-26 DIAGNOSIS — I350 Nonrheumatic aortic (valve) stenosis: Secondary | ICD-10-CM | POA: Insufficient documentation

## 2016-04-26 LAB — URINALYSIS, ROUTINE W REFLEX MICROSCOPIC
BILIRUBIN URINE: NEGATIVE
HGB URINE DIPSTICK: NEGATIVE
Ketones, ur: NEGATIVE
LEUKOCYTES UA: NEGATIVE
NITRITE: NEGATIVE
RBC / HPF: NONE SEEN (ref 0–?)
Specific Gravity, Urine: 1.01 (ref 1.000–1.030)
TOTAL PROTEIN, URINE-UPE24: NEGATIVE
Urine Glucose: NEGATIVE
Urobilinogen, UA: 0.2 (ref 0.0–1.0)
pH: 7.5 (ref 5.0–8.0)

## 2016-04-26 LAB — HEPATIC FUNCTION PANEL
ALBUMIN: 4.5 g/dL (ref 3.5–5.2)
ALT: 9 U/L (ref 0–53)
AST: 11 U/L (ref 0–37)
Alkaline Phosphatase: 62 U/L (ref 39–117)
BILIRUBIN TOTAL: 1.3 mg/dL — AB (ref 0.2–1.2)
Bilirubin, Direct: 0.3 mg/dL (ref 0.0–0.3)
Total Protein: 6.9 g/dL (ref 6.0–8.3)

## 2016-04-26 LAB — LIPID PANEL
CHOL/HDL RATIO: 4
Cholesterol: 128 mg/dL (ref 0–200)
HDL: 34.8 mg/dL — ABNORMAL LOW (ref >39.00)
LDL CALC: 79 mg/dL (ref 0–99)
NonHDL: 93.38
TRIGLYCERIDES: 71 mg/dL (ref 0.0–149.0)
VLDL: 14.2 mg/dL (ref 0.0–40.0)

## 2016-04-26 LAB — BASIC METABOLIC PANEL
BUN: 21 mg/dL (ref 6–23)
CALCIUM: 9.3 mg/dL (ref 8.4–10.5)
CO2: 30 mEq/L (ref 19–32)
Chloride: 98 mEq/L (ref 96–112)
Creatinine, Ser: 1 mg/dL (ref 0.40–1.50)
GFR: 74.83 mL/min (ref >60.00)
Glucose, Bld: 115 mg/dL — ABNORMAL HIGH (ref 70–99)
POTASSIUM: 3.2 meq/L — AB (ref 3.5–5.1)
Sodium: 136 mEq/L (ref 135–145)

## 2016-04-26 LAB — CBC WITH DIFFERENTIAL/PLATELET
BASOS PCT: 0.6 % (ref 0.0–3.0)
Basophils Absolute: 0.1 10*3/uL (ref 0.0–0.1)
EOS PCT: 0.3 % (ref 0.0–5.0)
Eosinophils Absolute: 0 10*3/uL (ref 0.0–0.7)
HEMATOCRIT: 38.6 % — AB (ref 39.0–52.0)
HEMOGLOBIN: 13.4 g/dL (ref 13.0–17.0)
LYMPHS PCT: 7.2 % — AB (ref 12.0–46.0)
Lymphs Abs: 1 10*3/uL (ref 0.7–4.0)
MCHC: 34.6 g/dL (ref 30.0–36.0)
MCV: 90.8 fl (ref 78.0–100.0)
MONOS PCT: 6 % (ref 3.0–12.0)
Monocytes Absolute: 0.8 10*3/uL (ref 0.1–1.0)
Neutro Abs: 11.9 10*3/uL — ABNORMAL HIGH (ref 1.4–7.7)
Neutrophils Relative %: 85.9 % — ABNORMAL HIGH (ref 43.0–77.0)
Platelets: 209 10*3/uL (ref 150.0–400.0)
RBC: 4.25 Mil/uL (ref 4.22–5.81)
RDW: 13.4 % (ref 11.5–15.5)
WBC: 13.8 10*3/uL — ABNORMAL HIGH (ref 4.0–10.5)

## 2016-04-26 LAB — HEMOGLOBIN A1C: Hgb A1c MFr Bld: 5.7 % (ref 4.6–6.5)

## 2016-04-26 LAB — SEDIMENTATION RATE: SED RATE: 14 mm/h (ref 0–20)

## 2016-04-26 LAB — TSH: TSH: 1.45 u[IU]/mL (ref 0.35–4.50)

## 2016-04-26 MED ORDER — HYDROCODONE-ACETAMINOPHEN 7.5-325 MG PO TABS: 1.0000 | ORAL_TABLET | Freq: Four times a day (QID) | ORAL | 0 refills | Status: DC | PRN

## 2016-04-26 NOTE — Patient Instructions (Addendum)
Please continue all other medications as before, and refills have been done if requested - the pain medication  You will be contacted regarding the referral for: Delsym OTC for cough, as well as OTC Claritin for allergies  Your EKG was ok today  You will be contacted regarding the referral for: echocardiogram (heart ultrasound)  Please have the pharmacy call with any other refills you may need.  Please continue your efforts at being more active, low cholesterol diet, and weight control.  You are otherwise up to date with prevention measures today.  Please keep your appointments with your specialists as you may have planned  Please go to the XRAY Department in the Basement (go straight as you get off the elevator) for the x-ray testing  Please go to the LAB in the Basement (turn left off the elevator) for the tests to be done today  You will be contacted by phone if any changes need to be made immediately.  Otherwise, you will receive a letter about your results with an explanation, but please check with MyChart first.  Please remember to sign up for MyChart if you have not done so, as this will be important to you in the future with finding out test results, communicating by private email, and scheduling acute appointments online when needed.  Please return in 6 months, or sooner if needed, with Lab testing done 3-5 days before

## 2016-04-26 NOTE — Progress Notes (Addendum)
Subjective:    Patient ID: Christopher Tran, male    DOB: 08/28/1925, 83 y.o.   MRN: [id number]  HPI  Here for yearly f/u;  Overall doing ok;  Pt denies Chest pain, worsening SOB, DOE, wheezing, orthopnea, PND, worsening LE edema, palpitations, dizziness or syncope.  Pt denies neurological change such as new headache, facial or extremity weakness.  Pt denies polydipsia, polyuria, or low sugar symptoms. Pt states overall good compliance with treatment and medications, good tolerability, and has been trying to follow appropriate diet.  Pt denies worsening depressive symptoms, suicidal ideation or panic. No fever, night sweats, wt loss, loss of appetite, or other constitutional symptoms.  Pt states good ability with ADL's, has low fall risk, home safety reviewed and adequate, no other significant changes in hearing or vision, and only occasionally active with exercise.  .Sees optho yearly. Declines tetanus today.  Did have some issues with double vision, improved with Dr Jorja Loayoung/optho.  Has hx of heart murmur since youth per pt.  Does also have a scant yellow prod cough for the past 2 wks. Also had several /generalized OA type pain, worse to the knees, shoulders and back, requests prn vicodiin as before.  Declines volt gel, other tx or sports med referral.  A friend was found to have RA and he is wanting this testing to make sure he does not have this as well, and could be other tx. Past Medical History:  Diagnosis Date  . DEGENERATIVE JOINT DISEASE, LUMBAR SPINE 01/06/2007   Qualifier: Diagnosis of  By: Dance CMA (AAMA), Kim    . FASCIITIS, PLANTAR 01/06/2007   Qualifier: Diagnosis of  By: Dance CMA (AAMA), Kim    . GERD (gastroesophageal reflux disease) 10/02/2013  . HYPERTENSION 01/06/2007   Qualifier: Diagnosis of  By: Dance CMA (AAMA), Kim    . Other abnormal blood chemistry 02/03/2007   Qualifier: Diagnosis of  By: Debby BudNorins MD, Rosalyn GessMichael E    Past Surgical History:  Procedure Laterality Date  .  BLEPHAROPLASTY  2006   Bilateral  . CATARACT EXTRACTION  2006   bilateral  . plantar fascilitis    . ROTATOR CUFF REPAIR  2007   right  . TONSILLECTOMY      reports that he has quit smoking. He has never used smokeless tobacco. He reports that he drinks alcohol. He reports that he does not use drugs. family history includes Other in his brother, father, mother, and sister. No Known Allergies Current Outpatient Prescriptions on File Prior to Visit  Medication Sig Dispense Refill  . amiloride-hydrochlorothiazide (MODURETIC) 5-50 MG tablet TAKE 1 TABLET EVERY DAY 90 tablet 1  . amLODipine (NORVASC) 5 MG tablet TAKE 1 TABLET EVERY DAY 90 tablet 1  . cholecalciferol (VITAMIN D) 1000 UNITS tablet Take 1,000 Units by mouth daily.    Marland Kitchen. HYDROcodone-acetaminophen (NORCO) 7.5-325 MG tablet Take 1 tablet by mouth every 6 (six) hours as needed for moderate pain. To fill Jan 04, 2016 120 tablet 0  . Omega-3 Fatty Acids (FISH OIL) 1000 MG CAPS Take 1 capsule (1,000 mg total) by mouth daily. 90 capsule 3  . omeprazole (PRILOSEC) 20 MG capsule TAKE 1 CAPSULE EVERY DAY AS NEEDED 90 capsule 3   No current facility-administered medications on file prior to visit.    Review of Systems Constitutional: Negative for increased diaphoresis, or other activity, appetite or siginficant weight change other than noted HENT: Negative for worsening hearing loss, ear pain, facial swelling, mouth sores and neck stiffness.  Eyes: Negative for other worsening pain, redness or visual disturbance.  Respiratory: Negative for choking or stridor Cardiovascular: Negative for other chest pain and palpitations.  Gastrointestinal: Negative for worsening diarrhea, blood in stool, or abdominal distention Genitourinary: Negative for hematuria, flank pain or change in urine volume.  Musculoskeletal: Negative for myalgias or other joint complaints.  Skin: Negative for other color change and wound or drainage.  Neurological: Negative  for syncope and numbness. other than noted Hematological: Negative for adenopathy. or other swelling Psychiatric/Behavioral: Negative for hallucinations, SI, self-injury, decreased concentration or other worsening agitation.  All other system neg per pt    Objective:   Physical Exam BP 140/82   Pulse 86   Temp 97.2 F (36.2 C)   Ht 5' 7.5" (1.715 m)   Wt 169 lb (76.7 kg)   SpO2 97%   BMI 26.08 kg/m  VS noted, mild overwt, does not have appear to have significant memory issue, though likely some HOH and possible slight slowed mentation Constitutional: Pt is oriented to person, place, and time. Appears well-developed and well-nourished, in no significant distress Head: Normocephalic and atraumatic  Eyes: Conjunctivae and EOM are normal. Pupils are equal, round, and reactive to light Right Ear: External ear normal.  Left Ear: External ear normal Nose: Nose normal.  Mouth/Throat: Oropharynx is clear and moist  Neck: Normal range of motion. Neck supple. No JVD present. No tracheal deviation present or significant neck LA or mass Cardiovascular: Normal rate, regular rhythm, normal heart sounds and intact distal pulses.  except for grade 2-3/6 murmur RUSB Pulmonary/Chest: Effort normal and breath sounds without rales or wheezing  Abdominal: Soft. Bowel sounds are normal. NT. No HSM  Musculoskeletal: Reduced range of motion to all joints to some degree, no active warmth or effusion, . Exhibits no edema Lymphadenopathy: Has no cervical adenopathy.  Neurological: Pt is alert and oriented to person, place, and time. Pt has normal reflexes. No cranial nerve deficit. Motor grossly intact Skin: Skin is warm and dry. No rash noted or new ulcers Psychiatric:  Has normal mood and affect. Behavior is normal.   Lab Results  Component Value Date   WBC 8.3 04/07/2015   HGB 14.6 04/07/2015   HCT 43.9 04/07/2015   PLT 268.0 04/07/2015   GLUCOSE 115 (H) 04/07/2015   CHOL 153 04/07/2015   TRIG 70.0  04/07/2015   HDL 36.80 (L) 04/07/2015   LDLCALC 103 (H) 04/07/2015   ALT 10 04/07/2015   AST 12 04/07/2015   NA 139 04/07/2015   K 3.8 04/07/2015   CL 101 04/07/2015   CREATININE 0.90 04/07/2015   BUN 15 04/07/2015   CO2 29 04/07/2015   TSH 1.67 04/07/2015   PSA 1.75 02/03/2007   HGBA1C 5.6 04/07/2015   ECG today I have personally interpreted Sinus  Rhythm  -First degree A-V block  -RSR(V1) -nondiagnostic.    Assessment & Plan:

## 2016-04-26 NOTE — Assessment & Plan Note (Signed)
stable overall by history and exam, recent data reviewed with pt, and pt to continue medical treatment as before,  to f/u any worsening symptoms or concerns BP Readings from Last 3 Encounters:  04/26/16 140/82  10/05/15 124/70  04/07/15 126/76

## 2016-04-26 NOTE — Assessment & Plan Note (Signed)
stable overall by history and exam, recent data reviewed with pt, and pt to continue medical treatment as before,  to f/u any worsening symptoms or concerns Lab Results  Component Value Date   HGBA1C 5.6 04/07/2015   For f/u lab

## 2016-04-26 NOTE — Assessment & Plan Note (Signed)
Cant r/o AS, asympt, for echo

## 2016-04-26 NOTE — Assessment & Plan Note (Addendum)
Suspect mild allergy post nasal gtt related, for claritin otc prn, also cxr today  Note:  Total time for pt hx, exam, review of record with pt in the room, determination of diagnoses and plan for further eval and tx is > 40 min, with over 50% spent in coordination and counseling of patient

## 2016-04-26 NOTE — Assessment & Plan Note (Signed)
Overall stable, ok for rheum labs as ordered, for pain med refill,  to f/u any worsening symptoms or concerns

## 2016-04-27 LAB — ANA: ANA: NEGATIVE

## 2016-04-27 LAB — RHEUMATOID FACTOR

## 2016-05-16 ENCOUNTER — Other Ambulatory Visit: Payer: Self-pay

## 2016-05-16 ENCOUNTER — Encounter: Payer: Self-pay | Admitting: Internal Medicine

## 2016-05-16 ENCOUNTER — Ambulatory Visit (HOSPITAL_COMMUNITY): Payer: Medicare Other | Attending: Cardiovascular Disease

## 2016-05-16 DIAGNOSIS — R011 Cardiac murmur, unspecified: Secondary | ICD-10-CM | POA: Insufficient documentation

## 2016-07-10 DIAGNOSIS — L578 Other skin changes due to chronic exposure to nonionizing radiation: Secondary | ICD-10-CM | POA: Diagnosis not present

## 2016-07-10 DIAGNOSIS — L814 Other melanin hyperpigmentation: Secondary | ICD-10-CM | POA: Diagnosis not present

## 2016-07-10 DIAGNOSIS — L570 Actinic keratosis: Secondary | ICD-10-CM | POA: Diagnosis not present

## 2016-07-26 ENCOUNTER — Other Ambulatory Visit: Payer: Self-pay | Admitting: Internal Medicine

## 2016-07-26 MED ORDER — HYDROCODONE-ACETAMINOPHEN 7.5-325 MG PO TABS: 1.0000 | ORAL_TABLET | Freq: Four times a day (QID) | ORAL | 0 refills | Status: DC | PRN

## 2016-07-26 NOTE — Telephone Encounter (Signed)
Handed to patient in office.

## 2016-07-26 NOTE — Telephone Encounter (Signed)
Done hardcopy to Shirron  

## 2016-08-22 ENCOUNTER — Other Ambulatory Visit: Payer: Self-pay | Admitting: Internal Medicine

## 2016-10-24 ENCOUNTER — Ambulatory Visit (INDEPENDENT_AMBULATORY_CARE_PROVIDER_SITE_OTHER): Payer: Medicare Other | Admitting: Internal Medicine

## 2016-10-24 ENCOUNTER — Ambulatory Visit (INDEPENDENT_AMBULATORY_CARE_PROVIDER_SITE_OTHER)
Admission: RE | Admit: 2016-10-24 | Discharge: 2016-10-24 | Disposition: A | Discharge status: Home / Self Care | Payer: Medicare Other | Source: Ambulatory Visit | Attending: Internal Medicine | Admitting: Internal Medicine

## 2016-10-24 ENCOUNTER — Encounter: Payer: Self-pay | Admitting: Internal Medicine

## 2016-10-24 VITALS — BP 122/84 | HR 87 | Temp 97.9°F | Ht 67.5 in | Wt 166.0 lb

## 2016-10-24 DIAGNOSIS — R7302 Impaired glucose tolerance (oral): Secondary | ICD-10-CM

## 2016-10-24 DIAGNOSIS — M545 Low back pain, unspecified: Secondary | ICD-10-CM | POA: Insufficient documentation

## 2016-10-24 DIAGNOSIS — Z23 Encounter for immunization: Secondary | ICD-10-CM | POA: Diagnosis not present

## 2016-10-24 DIAGNOSIS — M25551 Pain in right hip: Secondary | ICD-10-CM

## 2016-10-24 DIAGNOSIS — I10 Essential (primary) hypertension: Secondary | ICD-10-CM | POA: Diagnosis not present

## 2016-10-24 MED ORDER — AMILORIDE-HYDROCHLOROTHIAZIDE 5-50 MG PO TABS: 1.0000 | ORAL_TABLET | Freq: Every day | ORAL | 3 refills | Status: DC

## 2016-10-24 MED ORDER — AMLODIPINE BESYLATE 5 MG PO TABS: 5.0000 mg | ORAL_TABLET | Freq: Every day | ORAL | 3 refills | Status: DC

## 2016-10-24 MED ORDER — HYDROCODONE-ACETAMINOPHEN 7.5-325 MG PO TABS: 1.0000 | ORAL_TABLET | Freq: Four times a day (QID) | ORAL | 0 refills | Status: DC | PRN

## 2016-10-24 MED ORDER — OMEPRAZOLE 20 MG PO CPDR: DELAYED_RELEASE_CAPSULE | ORAL | 3 refills | Status: DC

## 2016-10-24 NOTE — Patient Instructions (Addendum)
You can also take Delsym OTC for cough  You had the flu shot today  Please continue all other medications as before, and refills have been done if requested.  Please have the pharmacy call with any other refills you may need.  Please continue your efforts at being more active, low cholesterol diet, and weight control.  You are otherwise up to date with prevention measures today.  Please keep your appointments with your specialists as you may have planned  Please go to the XRAY Department in the Basement (go straight as you get off the elevator) for the x-ray testing  Please remember to call for appt with Sports Medicine in this office if your pain is persistent  You will be contacted by phone if any changes need to be made immediately.  Otherwise, you will receive a letter about your results with an explanation, but please check with MyChart first.  Please remember to sign up for MyChart if you have not done so, as this will be important to you in the future with finding out test results, communicating by private email, and scheduling acute appointments online when needed.  Please return in 6 months, or sooner if needed

## 2016-10-24 NOTE — Progress Notes (Signed)
Subjective:    Patient ID: Christopher Tran, male    DOB: September 21, 1925, 83 y.o.   MRN: [id number]  HPI Here to f/u; overall doing ok,  Pt denies chest pain, increasing sob or doe, wheezing, orthopnea, PND, increased LE swelling, palpitations, dizziness or syncope.  Pt denies new neurological symptoms such as new headache, or facial or extremity weakness or numbness.  Pt denies polydipsia, polyuria, or low sugar episode.  Pt states overall good compliance with meds, mostly trying to follow appropriate diet, with wt overall stable,  but little exercise however.  Pt continues to have recurring right LBP for several months, mild to mod, intermittent without bowel or bladder change, fever, wt loss,  worsening LE pain/numbness/weakness, gait change or falls. Was recommended for previous lumbar surgury but he declined. Now with mostly right lower back pain that is worse to walk, though no giveaways or falls. Pain can radiate to the right buttock. No falls. Denies urinary symptoms such as dysuria, frequency, urgency, flank pain, hematuria or n/v, fever, chills.  .Denies worsening reflux, abd pain, dysphagia, n/v, bowel change or blood. Past Medical History:  Diagnosis Date  . DEGENERATIVE JOINT DISEASE, LUMBAR SPINE 01/06/2007   Qualifier: Diagnosis of  By: Dance CMA (AAMA), Kim    . FASCIITIS, PLANTAR 01/06/2007   Qualifier: Diagnosis of  By: Dance CMA (AAMA), Kim    . GERD (gastroesophageal reflux disease) 10/02/2013  . HYPERTENSION 01/06/2007   Qualifier: Diagnosis of  By: Dance CMA (AAMA), Kim    . Other abnormal blood chemistry 02/03/2007   Qualifier: Diagnosis of  By: Debby Bud MD, Rosalyn Gess    Past Surgical History:  Procedure Laterality Date  . BLEPHAROPLASTY  2006   Bilateral  . CATARACT EXTRACTION  2006   bilateral  . plantar fascilitis    . ROTATOR CUFF REPAIR  2007   right  . TONSILLECTOMY      reports that he has quit smoking. He has never used smokeless tobacco. He reports that he drinks  alcohol. He reports that he does not use drugs. family history includes Other in his brother, father, mother, and sister. No Known Allergies Current Outpatient Prescriptions on File Prior to Visit  Medication Sig Dispense Refill  . amiloride-hydrochlorothiazide (MODURETIC) 5-50 MG tablet TAKE 1 TABLET EVERY DAY 90 tablet 0  . amLODipine (NORVASC) 5 MG tablet TAKE 1 TABLET EVERY DAY 90 tablet 0  . cholecalciferol (VITAMIN D) 1000 UNITS tablet Take 1,000 Units by mouth daily.    Marland Kitchen HYDROcodone-acetaminophen (NORCO) 7.5-325 MG tablet Take 1 tablet by mouth every 6 (six) hours as needed for moderate pain. 120 tablet 0  . Omega-3 Fatty Acids (FISH OIL) 1000 MG CAPS Take 1 capsule (1,000 mg total) by mouth daily. 90 capsule 3  . omeprazole (PRILOSEC) 20 MG capsule TAKE 1 CAPSULE EVERY DAY AS NEEDED 90 capsule 0   No current facility-administered medications on file prior to visit.    Review of Systems  Constitutional: Negative for other unusual diaphoresis or sweats HENT: Negative for ear discharge or swelling Eyes: Negative for other worsening visual disturbances Respiratory: Negative for stridor or other swelling  Gastrointestinal: Negative for worsening distension or other blood Genitourinary: Negative for retention or other urinary change Musculoskeletal: Negative for other MSK pain or swelling Skin: Negative for color change or other new lesions Neurological: Negative for worsening tremors and other numbness  Psychiatric/Behavioral: Negative for worsening agitation or other fatigue All other system neg per pt    Objective:  Physical Exam BP 122/84   Pulse 87   Temp 97.9 F (36.6 C)   Ht 5' 7.5" (1.715 m)   Wt 166 lb (75.3 kg)   SpO2 98%   BMI 25.62 kg/m  VS noted,  Constitutional: Pt appears in NAD HENT: Head: NCAT.  Right Ear: External ear normal.  Left Ear: External ear normal.  Eyes: . Pupils are equal, round, and reactive to light. Conjunctivae and EOM are normal Nose:  without d/c or deformity Neck: Neck supple. Gross normal ROM Cardiovascular: Normal rate and regular rhythm.   Pulmonary/Chest: Effort normal and breath sounds without rales or wheezing.  Abd:  Soft, NT, ND, + BS, no organomegaly Spine nontender in the midline throughout, + mild tender right lumbar paravertebral, but not buttock area Neurological: Pt is alert. At baseline orientation, motor grossly intact Skin: Skin is warm. No rashes, other new lesions, no LE edema Psychiatric: Pt behavior is normal without agitation  No other exan findings. Lab Results  Component Value Date   WBC 13.8 (H) 04/26/2016   HGB 13.4 04/26/2016   HCT 38.6 (L) 04/26/2016   PLT 209.0 04/26/2016   GLUCOSE 115 (H) 04/26/2016   CHOL 128 04/26/2016   TRIG 71.0 04/26/2016   HDL 34.80 (L) 04/26/2016   LDLCALC 79 04/26/2016   ALT 9 04/26/2016   AST 11 04/26/2016   NA 136 04/26/2016   K 3.2 (L) 04/26/2016   CL 98 04/26/2016   CREATININE 1.00 04/26/2016   BUN 21 04/26/2016   CO2 30 04/26/2016   TSH 1.45 04/26/2016   PSA 1.75 02/03/2007   HGBA1C 5.7 04/26/2016       Assessment & Plan:

## 2016-10-24 NOTE — Assessment & Plan Note (Signed)
Exam benign, for films to r/o hip djd but suspect pain more likely lumbar radicular related

## 2016-10-24 NOTE — Assessment & Plan Note (Signed)
No prior hx of signficant lumbar dz, no neuro change on exam, suspect underlying lumbar DJD/DDD,  to f/u any worsening symptoms or concerns

## 2016-10-24 NOTE — Assessment & Plan Note (Signed)
stable overall by history and exam, recent data reviewed with pt, and pt to continue medical treatment as before,  to f/u any worsening symptoms or concerns Lab Results  Component Value Date   HGBA1C 5.7 04/26/2016   

## 2016-10-24 NOTE — Assessment & Plan Note (Signed)
stable overall by history and exam, recent data reviewed with pt, and pt to continue medical treatment as before,  to f/u any worsening symptoms or concerns BP Readings from Last 3 Encounters:  10/24/16 122/84  04/26/16 140/82  10/05/15 124/70

## 2016-10-25 ENCOUNTER — Telehealth: Payer: Self-pay

## 2016-10-25 NOTE — Telephone Encounter (Signed)
Office visit and result note letter mailed

## 2017-02-06 ENCOUNTER — Telehealth: Payer: Self-pay | Admitting: Internal Medicine

## 2017-02-06 MED ORDER — HYDROCODONE-ACETAMINOPHEN 7.5-325 MG PO TABS: 1.0000 | ORAL_TABLET | Freq: Four times a day (QID) | ORAL | 0 refills | Status: DC | PRN

## 2017-02-06 NOTE — Telephone Encounter (Signed)
Pt is here in the office with his wife who has an appointment with Dr Posey ReaPlotnikov. He is needing a refill on HYDROcodone-acetaminophen (NORCO) 7.5-325 MG tablet. He would like a written script to take to the pharmacy. Please advise.

## 2017-02-06 NOTE — Telephone Encounter (Signed)
OK - Rx emailed to [facility] Thx

## 2017-04-24 ENCOUNTER — Other Ambulatory Visit (INDEPENDENT_AMBULATORY_CARE_PROVIDER_SITE_OTHER): Payer: Medicare Other

## 2017-04-24 ENCOUNTER — Encounter: Payer: Self-pay | Admitting: Internal Medicine

## 2017-04-24 ENCOUNTER — Ambulatory Visit (INDEPENDENT_AMBULATORY_CARE_PROVIDER_SITE_OTHER): Payer: Medicare Other | Admitting: Internal Medicine

## 2017-04-24 ENCOUNTER — Ambulatory Visit (INDEPENDENT_AMBULATORY_CARE_PROVIDER_SITE_OTHER)
Admission: RE | Admit: 2017-04-24 | Discharge: 2017-04-24 | Disposition: A | Discharge status: Home / Self Care | Payer: Medicare Other | Source: Ambulatory Visit | Attending: Internal Medicine | Admitting: Internal Medicine

## 2017-04-24 VITALS — BP 118/78 | HR 66 | Temp 97.7°F | Ht 67.5 in | Wt 161.0 lb

## 2017-04-24 DIAGNOSIS — R05 Cough: Secondary | ICD-10-CM | POA: Diagnosis not present

## 2017-04-24 DIAGNOSIS — I5189 Other ill-defined heart diseases: Secondary | ICD-10-CM

## 2017-04-24 DIAGNOSIS — I10 Essential (primary) hypertension: Secondary | ICD-10-CM | POA: Diagnosis not present

## 2017-04-24 DIAGNOSIS — I5032 Chronic diastolic (congestive) heart failure: Secondary | ICD-10-CM | POA: Insufficient documentation

## 2017-04-24 DIAGNOSIS — R9389 Abnormal findings on diagnostic imaging of other specified body structures: Secondary | ICD-10-CM

## 2017-04-24 DIAGNOSIS — R7302 Impaired glucose tolerance (oral): Secondary | ICD-10-CM | POA: Diagnosis not present

## 2017-04-24 DIAGNOSIS — R059 Cough, unspecified: Secondary | ICD-10-CM

## 2017-04-24 DIAGNOSIS — I519 Heart disease, unspecified: Secondary | ICD-10-CM | POA: Diagnosis not present

## 2017-04-24 DIAGNOSIS — I050 Rheumatic mitral stenosis: Secondary | ICD-10-CM | POA: Insufficient documentation

## 2017-04-24 LAB — URINALYSIS, ROUTINE W REFLEX MICROSCOPIC
BILIRUBIN URINE: NEGATIVE
Hgb urine dipstick: NEGATIVE
KETONES UR: NEGATIVE
LEUKOCYTES UA: NEGATIVE
Nitrite: NEGATIVE
PH: 7 (ref 5.0–8.0)
RBC / HPF: NONE SEEN (ref 0–?)
Specific Gravity, Urine: 1.01 (ref 1.000–1.030)
TOTAL PROTEIN, URINE-UPE24: NEGATIVE
UROBILINOGEN UA: 0.2 (ref 0.0–1.0)
Urine Glucose: NEGATIVE

## 2017-04-24 LAB — CBC WITH DIFFERENTIAL/PLATELET
BASOS PCT: 1.3 % (ref 0.0–3.0)
Basophils Absolute: 0.1 10*3/uL (ref 0.0–0.1)
EOS ABS: 0.3 10*3/uL (ref 0.0–0.7)
Eosinophils Relative: 3.5 % (ref 0.0–5.0)
HCT: 40.2 % (ref 39.0–52.0)
Hemoglobin: 13.9 g/dL (ref 13.0–17.0)
Lymphocytes Relative: 16.2 % (ref 12.0–46.0)
Lymphs Abs: 1.2 10*3/uL (ref 0.7–4.0)
MCHC: 34.5 g/dL (ref 30.0–36.0)
MCV: 91.9 fl (ref 78.0–100.0)
Monocytes Absolute: 0.7 10*3/uL (ref 0.1–1.0)
Monocytes Relative: 9.2 % (ref 3.0–12.0)
NEUTROS ABS: 5 10*3/uL (ref 1.4–7.7)
NEUTROS PCT: 69.8 % (ref 43.0–77.0)
PLATELETS: 243 10*3/uL (ref 150.0–400.0)
RBC: 4.38 Mil/uL (ref 4.22–5.81)
RDW: 13.8 % (ref 11.5–15.5)
WBC: 7.2 10*3/uL (ref 4.0–10.5)

## 2017-04-24 LAB — LIPID PANEL
CHOL/HDL RATIO: 4
Cholesterol: 157 mg/dL (ref 0–200)
HDL: 43.4 mg/dL (ref >39.00)
LDL CALC: 99 mg/dL (ref 0–99)
NONHDL: 114.06
Triglycerides: 77 mg/dL (ref 0.0–149.0)
VLDL: 15.4 mg/dL (ref 0.0–40.0)

## 2017-04-24 LAB — BASIC METABOLIC PANEL
BUN: 23 mg/dL (ref 6–23)
CO2: 31 meq/L (ref 19–32)
Calcium: 9.9 mg/dL (ref 8.4–10.5)
Chloride: 98 mEq/L (ref 96–112)
Creatinine, Ser: 0.87 mg/dL (ref 0.40–1.50)
GFR: 87.68 mL/min (ref >60.00)
GLUCOSE: 120 mg/dL — AB (ref 70–99)
POTASSIUM: 3.5 meq/L (ref 3.5–5.1)
Sodium: 138 mEq/L (ref 135–145)

## 2017-04-24 LAB — HEMOGLOBIN A1C: HEMOGLOBIN A1C: 5.8 % (ref 4.6–6.5)

## 2017-04-24 LAB — HEPATIC FUNCTION PANEL
ALT: 9 U/L (ref 0–53)
AST: 13 U/L (ref 0–37)
Albumin: 4.5 g/dL (ref 3.5–5.2)
Alkaline Phosphatase: 56 U/L (ref 39–117)
BILIRUBIN DIRECT: 0.2 mg/dL (ref 0.0–0.3)
BILIRUBIN TOTAL: 1.3 mg/dL — AB (ref 0.2–1.2)
Total Protein: 7.3 g/dL (ref 6.0–8.3)

## 2017-04-24 LAB — TSH: TSH: 2.39 u[IU]/mL (ref 0.35–4.50)

## 2017-04-24 MED ORDER — BENZONATATE 100 MG PO CAPS: 100.0000 mg | ORAL_CAPSULE | Freq: Three times a day (TID) | ORAL | 1 refills | Status: DC | PRN

## 2017-04-24 MED ORDER — HYDROCODONE-ACETAMINOPHEN 7.5-325 MG PO TABS: 1.0000 | ORAL_TABLET | Freq: Four times a day (QID) | ORAL | 0 refills | Status: DC | PRN

## 2017-04-24 NOTE — Patient Instructions (Signed)
Please take all new medication as prescribed - the pills for cough as needed (sent to [facility])  Please continue all other medications as before, and refills have been done if requested.  Please have the pharmacy call with any other refills you may need.  Please continue your efforts at being more active, low cholesterol diet, and weight control.  You are otherwise up to date with prevention measures today.  You will be contacted regarding the referral for: Pulmonary  Please keep your appointments with your specialists as you may have planned  Please go to the XRAY Department in the Basement (go straight as you get off the elevator) for the x-ray testing  Please go to the LAB in the Basement (turn left off the elevator) for the tests to be done today  You will be contacted by phone if any changes need to be made immediately.  Otherwise, you will receive a letter about your results with an explanation, but please check with MyChart first.  Please remember to sign up for MyChart if you have not done so, as this will be important to you in the future with finding out test results, communicating by private email, and scheduling acute appointments online when needed.  Please return in 6 months, or sooner if needed

## 2017-04-24 NOTE — Progress Notes (Signed)
   Subjective:    Patient ID: Christopher Tran, male    DOB: December 13, 1925, 84 y.o.   MRN: [id number]  HPI    Has some weakness and off balance to first start walking, no falls. Declines Tdap.    Cough intermittent, better and worse, scant pod off white ? Pus per pt, no blood, no fever, asking for cough med due to wife conce.   Feb 2018 cxr with suggestion of ILD  Former smoker , quit x 60 yrs.   Review of Systems     Objective:   Physical Exam        Assessment & Plan:

## 2017-04-24 NOTE — Progress Notes (Signed)
Subjective:    Patient ID: Christopher Tran, male    DOB: 1925-04-01, 84 y.o.   MRN: [id number]  HPI Here to f/u, Pt denies chest pain, increased sob or doe, wheezing, orthopnea, PND, increased LE swelling, palpitations, dizziness or syncope but has persistent cough non prod for several wks without overt sinus symptoms, reflux or wheezing.  Does also have prior cxr suspicious for ILD.  Also c/o persistent large LIH that hurts only very occasionally, without worsening reflux, abd pain, dysphagia, n/v, bowel change or blood.  Pt denies polydipsia, polyuria.  No other new complaints or interval hx Past Medical History:  Diagnosis Date  . DEGENERATIVE JOINT DISEASE, LUMBAR SPINE 01/06/2007   Qualifier: Diagnosis of  By: Dance CMA (AAMA), Kim    . FASCIITIS, PLANTAR 01/06/2007   Qualifier: Diagnosis of  By: Dance CMA (AAMA), Kim    . GERD (gastroesophageal reflux disease) 10/02/2013  . HYPERTENSION 01/06/2007   Qualifier: Diagnosis of  By: Dance CMA (AAMA), Kim    . Other abnormal blood chemistry 02/03/2007   Qualifier: Diagnosis of  By: Debby BudNorins MD, Rosalyn GessMichael E    Past Surgical History:  Procedure Laterality Date  . BLEPHAROPLASTY  2006   Bilateral  . CATARACT EXTRACTION  2006   bilateral  . plantar fascilitis    . ROTATOR CUFF REPAIR  2007   right  . TONSILLECTOMY      reports that he has quit smoking. he has never used smokeless tobacco. He reports that he drinks alcohol. He reports that he does not use drugs. family history includes Other in his brother, father, mother, and sister. No Known Allergies Current Outpatient Medications on File Prior to Visit  Medication Sig Dispense Refill  . amiloride-hydrochlorothiazide (MODURETIC) 5-50 MG tablet Take 1 tablet by mouth daily. 90 tablet 3  . amLODipine (NORVASC) 5 MG tablet Take 1 tablet (5 mg total) by mouth daily. 90 tablet 3  . cholecalciferol (VITAMIN D) 1000 UNITS tablet Take 1,000 Units by mouth daily.    . Omega-3 Fatty Acids (FISH  OIL) 1000 MG CAPS Take 1 capsule (1,000 mg total) by mouth daily. 90 capsule 3  . omeprazole (PRILOSEC) 20 MG capsule TAKE 1 CAPSULE EVERY DAY AS NEEDED 90 capsule 3   No current facility-administered medications on file prior to visit.    Review of Systems  Constitutional: Negative for other unusual diaphoresis or sweats HENT: Negative for ear discharge or swelling Eyes: Negative for other worsening visual disturbances Respiratory: Negative for stridor or other swelling  Gastrointestinal: Negative for worsening distension or other blood Genitourinary: Negative for retention or other urinary change Musculoskeletal: Negative for other MSK pain or swelling Skin: Negative for color change or other new lesions Neurological: Negative for worsening tremors and other numbness  Psychiatric/Behavioral: Negative for worsening agitation or other fatigue All other system neg per pt    Objective:   Physical Exam BP 118/78   Pulse 66   Temp 97.7 F (36.5 C) (Oral)   Ht 5' 7.5" (1.715 m)   Wt 161 lb (73 kg)   SpO2 97%   BMI 24.84 kg/m  VS noted,  Constitutional: Pt appears in NAD HENT: Head: NCAT.  Right Ear: External ear normal.  Left Ear: External ear normal.  Eyes: . Pupils are equal, round, and reactive to light. Conjunctivae and EOM are normal Nose: without d/c or deformity Neck: Neck supple. Gross normal ROM Cardiovascular: Normal rate and regular rhythm.   Pulmonary/Chest: Effort normal and breath  sounds without rales or wheezing.  Abd:  Soft, NT, ND, + BS, no organomegaly, large LIH non tender and reducible Neurological: Pt is alert. At baseline orientation, motor grossly intact Skin: Skin is warm. No rashes, other new lesions, no LE edema Psychiatric: Pt behavior is normal without agitation  No other exam findings    Assessment & Plan:

## 2017-04-27 NOTE — Assessment & Plan Note (Signed)
stable overall by history and exam, recent data reviewed with pt, and pt to continue medical treatment as before,  to f/u any worsening symptoms or concerns BP Readings from Last 3 Encounters:  04/24/17 118/78  10/24/16 122/84  04/26/16 140/82

## 2017-04-27 NOTE — Assessment & Plan Note (Signed)
No evidence for volume overload, cont same tx and BP control

## 2017-04-27 NOTE — Assessment & Plan Note (Signed)
For f/u cxr, consider CT

## 2017-04-27 NOTE — Assessment & Plan Note (Signed)
stable overall by history and exam, recent data reviewed with pt, and pt to continue medical treatment as before,  to f/u any worsening symptoms or concerns Lab Results  Component Value Date   HGBA1C 5.8 04/24/2017

## 2017-04-27 NOTE — Assessment & Plan Note (Signed)
Etiology unclaer, ? Related to ILD, for pulm referral, tessalon perle prn

## 2017-05-02 ENCOUNTER — Institutional Professional Consult (permissible substitution): Payer: Medicare Other | Admitting: Internal Medicine

## 2017-06-11 ENCOUNTER — Emergency Department (HOSPITAL_COMMUNITY): Payer: Medicare Other

## 2017-06-11 ENCOUNTER — Inpatient Hospital Stay (HOSPITAL_COMMUNITY)
Admission: EM | Admit: 2017-06-11 | Discharge: 2017-06-13 | DRG: 193 | Disposition: A | Discharge status: Home / Self Care | Payer: Medicare Other | Attending: Internal Medicine | Admitting: Internal Medicine

## 2017-06-11 ENCOUNTER — Other Ambulatory Visit: Payer: Self-pay

## 2017-06-11 ENCOUNTER — Encounter (HOSPITAL_COMMUNITY): Payer: Self-pay

## 2017-06-11 DIAGNOSIS — I110 Hypertensive heart disease with heart failure: Secondary | ICD-10-CM | POA: Diagnosis present

## 2017-06-11 DIAGNOSIS — M1990 Unspecified osteoarthritis, unspecified site: Secondary | ICD-10-CM | POA: Diagnosis present

## 2017-06-11 DIAGNOSIS — K219 Gastro-esophageal reflux disease without esophagitis: Secondary | ICD-10-CM | POA: Diagnosis present

## 2017-06-11 DIAGNOSIS — Z79899 Other long term (current) drug therapy: Secondary | ICD-10-CM

## 2017-06-11 DIAGNOSIS — R32 Unspecified urinary incontinence: Secondary | ICD-10-CM | POA: Diagnosis present

## 2017-06-11 DIAGNOSIS — J9601 Acute respiratory failure with hypoxia: Secondary | ICD-10-CM | POA: Diagnosis not present

## 2017-06-11 DIAGNOSIS — R7302 Impaired glucose tolerance (oral): Secondary | ICD-10-CM | POA: Diagnosis not present

## 2017-06-11 DIAGNOSIS — E860 Dehydration: Secondary | ICD-10-CM | POA: Diagnosis present

## 2017-06-11 DIAGNOSIS — J189 Pneumonia, unspecified organism: Secondary | ICD-10-CM | POA: Diagnosis present

## 2017-06-11 DIAGNOSIS — R000 Tachycardia, unspecified: Secondary | ICD-10-CM | POA: Diagnosis not present

## 2017-06-11 DIAGNOSIS — R739 Hyperglycemia, unspecified: Secondary | ICD-10-CM | POA: Diagnosis not present

## 2017-06-11 DIAGNOSIS — R531 Weakness: Secondary | ICD-10-CM | POA: Diagnosis not present

## 2017-06-11 DIAGNOSIS — R7303 Prediabetes: Secondary | ICD-10-CM | POA: Diagnosis present

## 2017-06-11 DIAGNOSIS — M545 Low back pain: Secondary | ICD-10-CM | POA: Diagnosis not present

## 2017-06-11 DIAGNOSIS — G894 Chronic pain syndrome: Secondary | ICD-10-CM | POA: Diagnosis present

## 2017-06-11 DIAGNOSIS — E876 Hypokalemia: Secondary | ICD-10-CM | POA: Diagnosis not present

## 2017-06-11 DIAGNOSIS — J181 Lobar pneumonia, unspecified organism: Secondary | ICD-10-CM | POA: Diagnosis not present

## 2017-06-11 DIAGNOSIS — I5189 Other ill-defined heart diseases: Secondary | ICD-10-CM

## 2017-06-11 DIAGNOSIS — I5032 Chronic diastolic (congestive) heart failure: Secondary | ICD-10-CM

## 2017-06-11 DIAGNOSIS — Z87891 Personal history of nicotine dependence: Secondary | ICD-10-CM

## 2017-06-11 DIAGNOSIS — R17 Unspecified jaundice: Secondary | ICD-10-CM

## 2017-06-11 LAB — CBC WITH DIFFERENTIAL/PLATELET
Basophils Absolute: 0 10*3/uL (ref 0.0–0.1)
Basophils Relative: 0 %
Eosinophils Absolute: 0 10*3/uL (ref 0.0–0.7)
Eosinophils Relative: 0 %
HEMATOCRIT: 37 % — AB (ref 39.0–52.0)
Hemoglobin: 12.6 g/dL — ABNORMAL LOW (ref 13.0–17.0)
LYMPHS ABS: 0.4 10*3/uL — AB (ref 0.7–4.0)
LYMPHS PCT: 4 %
MCH: 31.3 pg (ref 26.0–34.0)
MCHC: 34.1 g/dL (ref 30.0–36.0)
MCV: 91.8 fL (ref 78.0–100.0)
Monocytes Absolute: 0.4 10*3/uL (ref 0.1–1.0)
Monocytes Relative: 4 %
NEUTROS ABS: 9.7 10*3/uL — AB (ref 1.7–7.7)
NEUTROS PCT: 92 %
Platelets: 163 10*3/uL (ref 150–400)
RBC: 4.03 MIL/uL — ABNORMAL LOW (ref 4.22–5.81)
RDW: 13 % (ref 11.5–15.5)
WBC: 10.5 10*3/uL (ref 4.0–10.5)

## 2017-06-11 LAB — PROTIME-INR
INR: 1.07
PROTHROMBIN TIME: 13.9 s (ref 11.4–15.2)

## 2017-06-11 LAB — COMPREHENSIVE METABOLIC PANEL
ALT: 14 U/L — AB (ref 17–63)
ANION GAP: 10 (ref 5–15)
AST: 18 U/L (ref 15–41)
Albumin: 3.8 g/dL (ref 3.5–5.0)
Alkaline Phosphatase: 59 U/L (ref 38–126)
BUN: 21 mg/dL — ABNORMAL HIGH (ref 6–20)
CHLORIDE: 97 mmol/L — AB (ref 101–111)
CO2: 26 mmol/L (ref 22–32)
CREATININE: 1.1 mg/dL (ref 0.61–1.24)
Calcium: 8.8 mg/dL — ABNORMAL LOW (ref 8.9–10.3)
GFR, EST NON AFRICAN AMERICAN: 57 mL/min — AB (ref >60)
Glucose, Bld: 151 mg/dL — ABNORMAL HIGH (ref 65–99)
Potassium: 3 mmol/L — ABNORMAL LOW (ref 3.5–5.1)
SODIUM: 133 mmol/L — AB (ref 135–145)
Total Bilirubin: 1.9 mg/dL — ABNORMAL HIGH (ref 0.3–1.2)
Total Protein: 6.4 g/dL — ABNORMAL LOW (ref 6.5–8.1)

## 2017-06-11 LAB — URINALYSIS, ROUTINE W REFLEX MICROSCOPIC
BILIRUBIN URINE: NEGATIVE
Bacteria, UA: NONE SEEN
Glucose, UA: NEGATIVE mg/dL
Ketones, ur: NEGATIVE mg/dL
Leukocytes, UA: NEGATIVE
Nitrite: NEGATIVE
Protein, ur: NEGATIVE mg/dL
Specific Gravity, Urine: 1.014 (ref 1.005–1.030)
pH: 5 (ref 5.0–8.0)

## 2017-06-11 LAB — CBG MONITORING, ED: Glucose-Capillary: 115 mg/dL — ABNORMAL HIGH (ref 65–99)

## 2017-06-11 LAB — PROCALCITONIN: PROCALCITONIN: 2.97 ng/mL

## 2017-06-11 LAB — HEMOGLOBIN A1C
Hgb A1c MFr Bld: 5.5 % (ref 4.8–5.6)
Mean Plasma Glucose: 111.15 mg/dL

## 2017-06-11 LAB — INFLUENZA PANEL BY PCR (TYPE A & B)
INFLAPCR: NEGATIVE
INFLBPCR: NEGATIVE

## 2017-06-11 LAB — STREP PNEUMONIAE URINARY ANTIGEN: Strep Pneumo Urinary Antigen: NEGATIVE

## 2017-06-11 LAB — GLUCOSE, CAPILLARY
GLUCOSE-CAPILLARY: 141 mg/dL — AB (ref 65–99)
GLUCOSE-CAPILLARY: 135 mg/dL — AB (ref 65–99)

## 2017-06-11 LAB — I-STAT CG4 LACTIC ACID, ED
LACTIC ACID, VENOUS: 1.49 mmol/L (ref 0.5–1.9)
Lactic Acid, Venous: 0.74 mmol/L (ref 0.5–1.9)

## 2017-06-11 LAB — MAGNESIUM: Magnesium: 1.6 mg/dL — ABNORMAL LOW (ref 1.7–2.4)

## 2017-06-11 MED ORDER — SODIUM CHLORIDE 0.9 % IV SOLN: 1.0000 g | INTRAVENOUS | Status: DC

## 2017-06-11 MED ORDER — ACETAMINOPHEN 500 MG PO TABS
1000.0000 mg | ORAL_TABLET | Freq: Once | ORAL | Status: AC
  Administered 2017-06-11: 1000 mg via ORAL
  Filled 2017-06-11: qty 2

## 2017-06-11 MED ORDER — AMLODIPINE BESYLATE 5 MG PO TABS
5.0000 mg | ORAL_TABLET | Freq: Every day | ORAL | Status: DC
  Administered 2017-06-11 – 2017-06-13 (×3): 5 mg via ORAL
  Filled 2017-06-11 (×3): qty 1

## 2017-06-11 MED ORDER — BENZONATATE 100 MG PO CAPS: 100.0000 mg | ORAL_CAPSULE | Freq: Three times a day (TID) | ORAL | Status: DC | PRN

## 2017-06-11 MED ORDER — SODIUM CHLORIDE 0.9 % IV BOLUS (SEPSIS)
1000.0000 mL | Freq: Once | INTRAVENOUS | Status: AC
  Administered 2017-06-11: 1000 mL via INTRAVENOUS

## 2017-06-11 MED ORDER — POTASSIUM CHLORIDE CRYS ER 20 MEQ PO TBCR
40.0000 meq | EXTENDED_RELEASE_TABLET | Freq: Once | ORAL | Status: AC
  Administered 2017-06-11: 40 meq via ORAL
  Filled 2017-06-11: qty 2

## 2017-06-11 MED ORDER — LIDOCAINE 5 % EX PTCH
1.0000 | MEDICATED_PATCH | CUTANEOUS | Status: DC
  Administered 2017-06-11 – 2017-06-13 (×3): 1 via TRANSDERMAL
  Filled 2017-06-11 (×3): qty 1

## 2017-06-11 MED ORDER — GUAIFENESIN ER 600 MG PO TB12: 600.0000 mg | ORAL_TABLET | Freq: Two times a day (BID) | ORAL | Status: DC | PRN

## 2017-06-11 MED ORDER — PREDNISONE 20 MG PO TABS
20.0000 mg | ORAL_TABLET | Freq: Every day | ORAL | Status: DC
  Administered 2017-06-11 – 2017-06-13 (×3): 20 mg via ORAL
  Filled 2017-06-11 (×4): qty 1

## 2017-06-11 MED ORDER — ENOXAPARIN SODIUM 40 MG/0.4ML ~~LOC~~ SOLN
40.0000 mg | SUBCUTANEOUS | Status: DC
Start: 2017-06-11 — End: 2017-06-13
  Filled 2017-06-11 (×2): qty 0.4

## 2017-06-11 MED ORDER — AZITHROMYCIN 500 MG IV SOLR
500.0000 mg | Freq: Once | INTRAVENOUS | Status: AC
  Administered 2017-06-11: 500 mg via INTRAVENOUS
  Filled 2017-06-11: qty 500

## 2017-06-11 MED ORDER — SODIUM CHLORIDE 0.9 % IV SOLN
INTRAVENOUS | Status: DC
  Administered 2017-06-11 – 2017-06-12 (×4): via INTRAVENOUS

## 2017-06-11 MED ORDER — SODIUM CHLORIDE 0.9 % IV BOLUS (SEPSIS)
250.0000 mL | Freq: Once | INTRAVENOUS | Status: DC
  Administered 2017-06-11: 250 mL via INTRAVENOUS

## 2017-06-11 MED ORDER — SODIUM CHLORIDE 0.9 % IV SOLN
1.0000 g | Freq: Once | INTRAVENOUS | Status: AC
  Administered 2017-06-11: 1 g via INTRAVENOUS
  Filled 2017-06-11: qty 10

## 2017-06-11 MED ORDER — SODIUM CHLORIDE 0.9 % IV SOLN
500.0000 mg | INTRAVENOUS | Status: DC
  Administered 2017-06-12 – 2017-06-13 (×2): 500 mg via INTRAVENOUS
  Filled 2017-06-11 (×4): qty 500

## 2017-06-11 MED ORDER — IPRATROPIUM-ALBUTEROL 0.5-2.5 (3) MG/3ML IN SOLN: 3.0000 mL | RESPIRATORY_TRACT | Status: DC | PRN

## 2017-06-11 MED ORDER — ACETAMINOPHEN 325 MG PO TABS: 650.0000 mg | ORAL_TABLET | Freq: Four times a day (QID) | ORAL | Status: DC | PRN

## 2017-06-11 MED ORDER — SODIUM CHLORIDE 0.9 % IV SOLN
1.0000 g | INTRAVENOUS | Status: DC
  Administered 2017-06-12 – 2017-06-13 (×2): 1 g via INTRAVENOUS
  Filled 2017-06-11 (×2): qty 10

## 2017-06-11 MED ORDER — PANTOPRAZOLE SODIUM 40 MG PO TBEC
40.0000 mg | DELAYED_RELEASE_TABLET | Freq: Every day | ORAL | Status: DC
  Administered 2017-06-11 – 2017-06-13 (×3): 40 mg via ORAL
  Filled 2017-06-11 (×3): qty 1

## 2017-06-11 MED ORDER — OXYCODONE-ACETAMINOPHEN 5-325 MG PO TABS
1.0000 | ORAL_TABLET | ORAL | Status: DC | PRN
  Administered 2017-06-12: 2 via ORAL
  Filled 2017-06-11: qty 2

## 2017-06-11 MED ORDER — POTASSIUM CHLORIDE 10 MEQ/100ML IV SOLN
10.0000 meq | Freq: Once | INTRAVENOUS | Status: AC
  Administered 2017-06-11: 10 meq via INTRAVENOUS
  Filled 2017-06-11: qty 100

## 2017-06-11 NOTE — ED Notes (Signed)
Attempted to call report, per secretary nurse Vickie will come get patient and receive bedside report.

## 2017-06-11 NOTE — Evaluation (Signed)
Physical Therapy Evaluation Patient Details Name: Christopher Tran Newmann MRN: [id number] DOB: 19-Aug-1925 Today's Date: 06/11/2017   History of Present Illness  Christopher Tran Giller is a 84 y.o. male with a Past Medical History of HTN and chronic but progressively debilitating back pain who presents with not feeling well.  He reports 3-6 months of progressive decline which he appears to primarily attribute to limited mobility in the setting of severe back pain. He was admitted with CAP with fever, tachycardia and tachypnea.  Clinical Impression  Patient presents with decreased mobility due to pain in lower back/lumbar area and generalized weakness.  He will benefit from follow up Outpatient PT for progression of exercises and activities for body mechanics education.  Feel he can mobilize slightly better with walker and pt has one at home already.  Will check on pt next day if not Tran/c.    Follow Up Recommendations Outpatient PT;Supervision/Assistance - 24 hour    Equipment Recommendations  None recommended by PT    Recommendations for Other Services       Precautions / Restrictions Precautions Precautions: Fall      Mobility  Bed Mobility Overal bed mobility: Needs Assistance Bed Mobility: Supine to Sit;Sit to Supine     Supine to sit: Supervision;HOB elevated Sit to supine: Supervision   General bed mobility comments: assist for safety, increased time, limited by back pain  Transfers Overall transfer level: Needs assistance Equipment used: None;Rolling walker (2 wheeled) Transfers: Sit to/from Stand Sit to Stand: Supervision         General transfer comment: cues for hand placement  Ambulation/Gait Ambulation/Gait assistance: Min guard;Supervision Ambulation Distance (Feet): 300 Feet(150) Assistive device: Rolling walker (2 wheeled);None Gait Pattern/deviations: Step-through pattern;Decreased stride length     General Gait Details: used RW second walk x 300', min cues for  upright posture and proximity to walker, but pt more comfortable with bending forward, noted more shuffling pattern with no device  Stairs            Wheelchair Mobility    Modified Rankin (Stroke Patients Only)       Balance Overall balance assessment: Needs assistance   Sitting balance-Leahy Scale: Good       Standing balance-Leahy Scale: Fair                               Pertinent Vitals/Pain Pain Assessment: 0-10 Pain Score: 7  Pain Location: bilateral lower back Pain Descriptors / Indicators: Aching;Dull;Guarding Pain Intervention(s): Limited activity within patient's tolerance;Repositioned;Monitored during session    Home Living Family/patient expects to be discharged to:: Private residence Living Arrangements: Spouse/significant other Available Help at Discharge: Family Type of Home: House Home Access: Level entry     Home Layout: One level Home Equipment: Environmental consultantWalker - 2 wheels;Shower seat Additional Comments: wife inpatient today undergoing AVR    Prior Function Level of Independence: Independent         Comments: likes to work in yard     Higher education careers adviserHand Dominance        Extremity/Trunk Assessment   Upper Extremity Assessment Upper Extremity Assessment: Overall WFL for tasks assessed    Lower Extremity Assessment Lower Extremity Assessment: Overall WFL for tasks assessed    Cervical / Trunk Assessment Cervical / Trunk Assessment: Lordotic  Communication   Communication: No difficulties  Cognition Arousal/Alertness: Awake/alert Behavior During Therapy: WFL for tasks assessed/performed Overall Cognitive Status: Within Functional Limits for tasks assessed  General Comments General comments (skin integrity, edema, etc.): family in room and report though pt's wife undergoing surgery today they want for him to get some help due to ongoing back pain    Exercises Other  Exercises Other Exercises: attempted education in lower back HEP: performed lower trunk rotation and pt felt pain, single knee to chest and SLR without pain   Assessment/Plan    PT Assessment Patient needs continued PT services  PT Problem List Decreased balance;Decreased knowledge of use of DME;Decreased activity tolerance;Decreased mobility;Decreased knowledge of precautions       PT Treatment Interventions Gait training;DME instruction;Balance training;Therapeutic exercise;Patient/family education;Therapeutic activities;Functional mobility training    PT Goals (Current goals can be found in the Care Plan section)  Acute Rehab PT Goals Patient Stated Goal: To return to activities PT Goal Formulation: With patient/family Time For Goal Achievement: 06/18/17 Potential to Achieve Goals: Good    Frequency     Barriers to discharge        Co-evaluation               AM-PAC PT "6 Clicks" Daily Activity  Outcome Measure Difficulty turning over in bed (including adjusting bedclothes, sheets and blankets)?: A Lot Difficulty moving from lying on back to sitting on the side of the bed? : A Little Difficulty sitting down on and standing up from a chair with arms (e.g., wheelchair, bedside commode, etc,.)?: A Lot Help needed moving to and from a bed to chair (including a wheelchair)?: A Little Help needed walking in hospital room?: A Little Help needed climbing 3-5 steps with a railing? : A Lot 6 Click Score: 15    End of Session Equipment Utilized During Treatment: Gait belt Activity Tolerance: Patient tolerated treatment well Patient left: in bed;with call bell/phone within reach;with family/visitor present   PT Visit Diagnosis: Other abnormalities of gait and mobility (R26.89);Pain Pain - part of body: (lower back)    Time: 1100-1127 PT Time Calculation (min) (ACUTE ONLY): 27 min   Charges:   PT Evaluation $PT Eval Low Complexity: 1 Low PT Treatments $Gait Training:  8-22 mins   PT G CodesSheran Lawless, [place] [phone] 06/11/2017   Elray Mcgregor 06/11/2017, 1:18 PM

## 2017-06-11 NOTE — ED Triage Notes (Signed)
Patient coming from home for weakness that was discovered upon awaking this AM.  Stated he has chronic back pain.  This am patient was diaphoretic on EMS arrival, 85% on room air.  BP normal.  A&Ox4

## 2017-06-11 NOTE — Progress Notes (Signed)
Helene KelpHerbert D Pitz [id number] Admission Data: 06/11/2017 5:58 PM Attending Provider: Jonah BlueYates, Jennifer, MD  GUY:QIHKPCP:John, Len BlalockJames W, MD Consults/ Treatment Team:   Helene KelpHerbert D Zakrzewski is a 84 y.o. male patient admitted from ED awake, alert  & orientated  X 3,  Full Code, VSS - Blood pressure 134/72, pulse 63, temperature 98.3 F (36.8 C), temperature source Oral, resp. rate 18, height 5\' 8"  (1.727 m), weight 74.8 kg (165 lb), SpO2 93 %.,  no c/o shortness of breath, no c/o chest pain, no distress noted. Tele # 09 placed and pt is currently running:normal sinus rhythm.   IV site WDL:  antecubital right, condition patent and no redness and left, condition patent and no redness with a transparent dsg that's clean dry and intact.  Allergies:  No Known Allergies   Past Medical History:  Diagnosis Date  . DEGENERATIVE JOINT DISEASE, LUMBAR SPINE 01/06/2007   Qualifier: Diagnosis of  By: Dance CMA (AAMA), Kim    . FASCIITIS, PLANTAR 01/06/2007   Qualifier: Diagnosis of  By: Dance CMA (AAMA), Kim    . GERD (gastroesophageal reflux disease) 10/02/2013  . HYPERTENSION 01/06/2007   Qualifier: Diagnosis of  By: Dance CMA (AAMA), Kim    . Other abnormal blood chemistry 02/03/2007   Qualifier: Diagnosis of  By: Debby BudNorins MD, Rosalyn GessMichael E     History:  obtained from chart review. Tobacco/alcohol: denied none  Pt orientation to unit, room and routine. Information packet given to patient/family and safety video watched.  Admission INP armband ID verified with patient/family, and in place. SR up x 2, fall risk assessment complete with Patient and family verbalizing understanding of risks associated with falls. Pt verbalizes an understanding of how to use the call bell and to call for help before getting out of bed.  Skin, clean-dry- intact without evidence of bruising, or skin tears.   No evidence of skin break down noted on exam. no rashes, no ecchymoses, no petechiae, no nodules, no jaundice, no purpura, no  wounds    Will cont to monitor and assist as needed.  Camillo FlamingVicki L Kazuto Sevey, RN 06/11/2017 5:58 PM

## 2017-06-11 NOTE — ED Notes (Signed)
Patient denies back pain states does not want to get back pain.

## 2017-06-11 NOTE — ED Notes (Signed)
Patient taken to XRAY

## 2017-06-11 NOTE — ED Notes (Signed)
PT at bedside.

## 2017-06-11 NOTE — ED Notes (Signed)
Patient arrived to 5C13 holding area

## 2017-06-11 NOTE — H&P (Addendum)
History and Physical    VERBON GIANGREGORIO ZOX:[id number] DOB: 1925-06-07 DOA: 06/11/2017   PCP: Corwin Levins, MD   Patient coming from:  Home    Chief Complaint: "No feeling well "  HPI: NEVAEH CASILLAS is a 84 y.o. male with medical history significant for HTN, DJD, chronic pain syndrome, GERD, never been hospitalized, brought the emergency department for evaluation of worsening back pain, and recent productive cough worse on the morning prior to admission.  His grandson reported that the patient had difficulty sitting up independently in the bed, and also reporting feeling warm.  He denies any shortness of breath or cough.  No chest pain or palpitations.  He had been very independent until this admission, caring for his wife who is at this very moment having an aortic valve replacement at this hospital.  The patient denies any diaphoresis.  He denies any headaches or vision changes.  He denies any abdominal pain or nausea.  He denies any lower extremity swelling or pain.  He denies any dysuria or gross hematuria.  Is not up-to-date with his vaccinations.  He is unaware of sick contacts.  Has any history of COPD or oxygen requirements in the past, denies any tobacco, alcohol or recreational drug use.  Denies any recent long distance travels    ED Course:  BP 131/67   Pulse 91   Temp (S) (!) 103.4 F (39.7 C) (Rectal)   Resp (!) 26   Ht 5\' 8"  (1.727 m)   Wt 74.8 kg (165 lb)   SpO2 96%   BMI 25.09 kg/m   Initially, the patient was considered code sepsis, as he was tachycardic, tachypneic, and he was febrile with a T-max of 103.4.  His O2 sats were in the 80s according to liters of oxygen with good improvement.  His lactic acid was normal at 1.49.   Chest x-ray patchy consolidation seen right greater than left lung bases, small right effusion, consistent with pneumonia Placed on Rocephin and Zithromax, Tylenol for fever, and given 2 L of IV fluids Blood cultures pending Influenza  pending Sodium 133, potassium 3, glucose 151, creatinine Total bilirubin 1.9 Hemoglobin 12.6 PT and INR Urinalysis  EKG sinus tachycardia He also received KCl 10 mEq IV for potassium replenishment  Review of Systems:  As per HPI otherwise all other systems reviewed and are negative  Past Medical History:  Diagnosis Date  . DEGENERATIVE JOINT DISEASE, LUMBAR SPINE 01/06/2007   Qualifier: Diagnosis of  By: Dance CMA (AAMA), Kim    . FASCIITIS, PLANTAR 01/06/2007   Qualifier: Diagnosis of  By: Dance CMA (AAMA), Kim    . GERD (gastroesophageal reflux disease) 10/02/2013  . HYPERTENSION 01/06/2007   Qualifier: Diagnosis of  By: Dance CMA (AAMA), Kim    . Other abnormal blood chemistry 02/03/2007   Qualifier: Diagnosis of  By: Debby Bud MD, Rosalyn Gess     Past Surgical History:  Procedure Laterality Date  . BLEPHAROPLASTY  2006   Bilateral  . CATARACT EXTRACTION  2006   bilateral  . plantar fascilitis    . ROTATOR CUFF REPAIR  2007   right  . TONSILLECTOMY      Social History Social History   Socioeconomic History  . Marital status: Married    Spouse name: Not on file  . Number of children: 1  . Years of education: 59  . Highest education level: Not on file  Occupational History  . Occupation: Development worker, community    Comment: retired  Social Needs  . Financial resource strain: Not on file  . Food insecurity:    Worry: Not on file    Inability: Not on file  . Transportation needs:    Medical: Not on file    Non-medical: Not on file  Tobacco Use  . Smoking status: Former Games developer  . Smokeless tobacco: Never Used  . Tobacco comment: >50 years ago.  Substance and Sexual Activity  . Alcohol use: Yes    Comment: occ beer  . Drug use: No  . Sexual activity: Not Currently  Lifestyle  . Physical activity:    Days per week: Not on file    Minutes per session: Not on file  . Stress: Not on file  Relationships  . Social connections:    Talks on phone: Not on file    Gets  together: Not on file    Attends religious service: Not on file    Active member of club or organization: Not on file    Attends meetings of clubs or organizations: Not on file    Relationship status: Not on file  . Intimate partner violence:    Fear of current or ex partner: Not on file    Emotionally abused: Not on file    Physically abused: Not on file    Forced sexual activity: Not on file  Other Topics Concern  . Not on file  Social History Narrative   Married '57-she had nephrectomy for RCC-robotic surgery. 1 son - 45. 2 grandchildren. 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 Keeps Busy. ACP/End of Life Care: No heroic measures of life support; i.e. DNR, no mechanical ventilation.      No Known Allergies  Family History  Problem Relation Age of Onset  . Other Father        Killed in [employer] [place]  . Other Mother        Diphtheria  . Other Brother        Killed in MVA 2022/07/22  . Other Sister        Died at [employer]  . Prostate cancer Neg Hx   . Colon cancer Neg Hx       Prior to Admission medications   Medication Sig Start Date End Date Taking? Authorizing Provider  amLODipine (NORVASC) 5 MG tablet Take 1 tablet (5 mg total) by mouth daily. 10/24/16  Yes Corwin Levins, MD  benzonatate (TESSALON PERLES) 100 MG capsule Take 1 capsule (100 mg total) by mouth 3 (three) times daily as needed for cough. 04/24/17 04/24/18 Yes Corwin Levins, MD  cholecalciferol (VITAMIN D) 1000 UNITS tablet Take 1,000 Units by mouth daily.   Yes [provider]  HYDROcodone-acetaminophen (NORCO) 7.5-325 MG tablet Take 1 tablet by mouth every 6 (six) hours as needed for moderate pain. 04/24/17  Yes Corwin Levins, MD  Omega-3 Fatty Acids (FISH OIL) 1000 MG CAPS Take 1 capsule (1,000 mg total) by mouth daily. 08/07/12  Yes Norins, Rosalyn Gess, MD  omeprazole (PRILOSEC) 20 MG capsule TAKE 1 CAPSULE EVERY DAY AS NEEDED Patient taking differently: Take 20 mg by mouth daily as needed (for acid reflux).  10/24/16  Yes  Corwin Levins, MD  amiloride-hydrochlorothiazide (MODURETIC) 5-50 MG tablet Take 1 tablet by mouth daily. Patient not taking: Reported on 06/11/2017 10/24/16   Corwin Levins, MD    Physical Exam:  Vitals:   06/11/17 0545 06/11/17 0600 06/11/17 0615 06/11/17 0645  BP: 125/67 130/62 133/76 131/67  Pulse:  Marland Kitchen)  105 (!) 102 91  Resp: 19 18 (!) 24 (!) 26  Temp:      TempSrc:      SpO2: 93% 91% 93% 96%  Weight:  74.8 kg (165 lb)    Height:  5\' 8"  (1.727 m)     Constitutional: NAD, calm, comfortable  Eyes: PERRL, lids and conjunctivae normal ENMT: Mucous membranes are dry without exudate or lesions  Neck: normal, supple, no masses, no thyromegaly Respiratory:  Coarse breath sounds, bilateral expiratory wheezing, no crackles  Normal respiratory effort Cardiovascular: Regular rate and rhythm,  1/6 murmur, rubs or gallops. No extremity edema. 2+ pedal pulses. No carotid bruits.  Abdomen: Soft, non tender, No hepatosplenomegaly. Bowel sounds positive.  Musculoskeletal: no clubbing / cyanosis. Moves all extremities Skin: no jaundice, No lesions.   Neurologic: Sensation intact  Strength equal in all extremities Psychiatric:   Alert and oriented x 3. Normal mood.     Labs on Admission: I have personally reviewed following labs and imaging studies  CBC: Recent Labs  Lab 06/11/17 0535  WBC 10.5  NEUTROABS 9.7*  HGB 12.6*  HCT 37.0*  MCV 91.8  PLT 163    Basic Metabolic Panel: Recent Labs  Lab 06/11/17 0535  NA 133*  K 3.0*  CL 97*  CO2 26  GLUCOSE 151*  BUN 21*  CREATININE 1.10  CALCIUM 8.8*    GFR: Estimated Creatinine Clearance: 44 mL/min (by C-G formula based on SCr of 1.1 mg/dL).  Liver Function Tests: Recent Labs  Lab 06/11/17 0535  AST 18  ALT 14*  ALKPHOS 59  BILITOT 1.9*  PROT 6.4*  ALBUMIN 3.8   No results for input(s): LIPASE, AMYLASE in the last 168 hours. No results for input(s): AMMONIA in the last 168 hours.  Coagulation Profile: Recent Labs   Lab 06/11/17 0535  INR 1.07    Cardiac Enzymes: No results for input(s): CKTOTAL, CKMB, CKMBINDEX, TROPONINI in the last 168 hours.  BNP (last 3 results) No results for input(s): PROBNP in the last 8760 hours.  HbA1C: No results for input(s): HGBA1C in the last 72 hours.  CBG: No results for input(s): GLUCAP in the last 168 hours.  Lipid Profile: No results for input(s): CHOL, HDL, LDLCALC, TRIG, CHOLHDL, LDLDIRECT in the last 72 hours.  Thyroid Function Tests: No results for input(s): TSH, T4TOTAL, FREET4, T3FREE, THYROIDAB in the last 72 hours.  Anemia Panel: No results for input(s): VITAMINB12, FOLATE, FERRITIN, TIBC, IRON, RETICCTPCT in the last 72 hours.  Urine analysis:    Component Value Date/Time   COLORURINE YELLOW 06/11/2017 0724   APPEARANCEUR CLEAR 06/11/2017 0724   LABSPEC 1.014 06/11/2017 0724   PHURINE 5.0 06/11/2017 0724   GLUCOSEU NEGATIVE 06/11/2017 0724   GLUCOSEU NEGATIVE 04/24/2017 1103   HGBUR SMALL (A) 06/11/2017 0724   BILIRUBINUR NEGATIVE 06/11/2017 0724   KETONESUR NEGATIVE 06/11/2017 0724   PROTEINUR NEGATIVE 06/11/2017 0724   UROBILINOGEN 0.2 04/24/2017 1103   NITRITE NEGATIVE 06/11/2017 0724   LEUKOCYTESUR NEGATIVE 06/11/2017 0724    Sepsis Labs: @LABRCNTIP (procalcitonin:4,lacticidven:4) )No results found for this or any previous visit (from the past 240 hour(s)).   Radiological Exams on Admission: Dg Chest 2 View  Result Date: 06/11/2017 CLINICAL DATA:  84 y/o  M; weakness and chronic back pain. EXAM: CHEST - 2 VIEW COMPARISON:  04/24/2017 chest radiographs FINDINGS: Stable normal cardiac silhouette given projection and technique. Aortic atherosclerosis with calcification. Patchy consolidations in the right greater than left lung base. Small right pleural effusion. No  acute osseous abnormality is evident. IMPRESSION: Patchy consolidations in right greater than left lung bases and small right effusion. Findings probably represent  pneumonia. Electronically Signed   By: Mitzi Hansen M.D.   On: 06/11/2017 06:17   Dg Lumbar Spine Complete  Result Date: 06/11/2017 CLINICAL DATA:  84 y/o  M; weakness chronic back pain. EXAM: LUMBAR SPINE - COMPLETE 4+ VIEW COMPARISON:  10/24/2016 lumbar spine radiographs FINDINGS: Stable mild levocurvature with apex at T12-L1. normal lumbar lordosis without listhesis. Multilevel multilevel lumbar spondylosis loss of disc space height greatest at L1-2 and L2-3. Abdominal aortic atherosclerosis. Vertebral body heights are preserved. IMPRESSION: 1.  No acute bony or articular abnormality identified. 2. Stable lumbar spine levocurvature and moderate multilevel spondylosis. Electronically Signed   By: Mitzi Hansen M.D.   On: 06/11/2017 06:21    EKG: Independently reviewed.    Assessment/Plan Principal Problem:   Pneumonia Active Problems:   Chronic pain syndrome   GERD (gastroesophageal reflux disease)   Impaired glucose tolerance   Diastolic dysfunction   Hypokalemia   Elevated bilirubin   Acute Respiratory Failure with Hypoxia likely due to  CAP PSI 114 Risk Class IV, 8.2-9.3% mortality. Hospitalization recommended based on risk.  fever up to 103 Chest x-ray patchy consolidation seen right greater than left lung bases, small right effusion, consistent with pneumonia WBC 10.5  Lactic acid normal  Bicarb 26 Placed on Rocephin and Zithromax, Tylenol for fever, and given 2 L of IV fluids Blood cultures, UA  pending. Influenza pending Admit to Tele Obs Oxygen   sputum cultures, blood cx and UA  IV antibiotics with per protocol with Zithromax and Rocephin  Procalcitonin,Strep pneumo urine antigen, Legionella urine antigen Nebulizers as needed, with Duoneb q 4 prn  Mucinex prn  IV fluids 100 cc NS antipyretics Repeat CBC in am  Hypertension BP 131/67   Pulse 91  Continue home anti-hypertensive medications   Hypokalemia, may be due to recent diuretics. EKG  initially was ST but the tachycardia has resolved at this time. Chest pain free . K was 3. Received  10 meq IVx1.  Oral replenishment as needed Repeat BMET in am Hold diuretics  Diastolic CHF: No acute decompensation weight 168 lbs  Last 2 D echo 05/2916  Systolic function was normal. EF  55% to 60%. grade 1 diastolic dysfunction  Obtain daily weights Monitor intake and output   GERD, no acute symptoms Continue PPI  Elevated Glucose Patient noted to have elevated Glucose in labs at 151 . No prior documented history of DM Check A1C Check CBG bid   Elevated bilirubin, 1.9  in the setting of  dehydration . Normal transaminases   No abdominal pain. No jaundice noted. No bleeding issues IVF 100 cc/h  Repeat BMET in am  Debility, in the setting of chronic pain syndrome and current illness PT and OT  Pain meds prn    DVT prophylaxis:  Lovenox Code Status:    Full  Family Communication:  Discussed with patient Disposition Plan: Expect patient to be discharged to home after condition improves Consults called:    None Admission status: Tele Obs   Marlowe Kays, PA-C [facility]   Amion text  [phone]   06/11/2017, 8:20 AM

## 2017-06-11 NOTE — ED Provider Notes (Signed)
[facility] Provider Note   CSN: [id number] Arrival date & time: 06/11/17  [id number]     History   Chief Complaint Chief Complaint  Patient presents with  . Weakness    HPI Christopher Tran is a [age] y.o. male.  HPI  This is an 84 year old male with a history of hypertension and degenerative disc disease who presents with generalized weakness.  Patient grandson is at the bedside.  Reports that this morning patient had difficulty sitting up independently in the bed.  He states "he just would fall backwards."  This is new for the patient.  He was getting up to bring his wife for an aortic valve replacement.  Grandson noted that the patient was warm.  Patient is only complaining of back pain.  States he has a history of lower back pain which seems to be getting progressively worse.  Denies any difficulty urinating.  He does report some occasional incontinence which is not new.  Denies any weakness, numbness, tingling of the lower extremities.  He denies any chest pain or shortness of breath.  Denies recent fevers.  Does report recent productive cough which is worse in the morning.  Upon EMS evaluation, patient noted to be hypoxic.  Denies any history of COPD or O2 requirement in the past.  Past Medical History:  Diagnosis Date  . DEGENERATIVE JOINT DISEASE, LUMBAR SPINE 01/06/2007   Qualifier: Diagnosis of  By: Dance CMA (AAMA), Kim    . FASCIITIS, PLANTAR 01/06/2007   Qualifier: Diagnosis of  By: Dance CMA (AAMA), Kim    . GERD (gastroesophageal reflux disease) 10/02/2013  . HYPERTENSION 01/06/2007   Qualifier: Diagnosis of  By: Dance CMA (AAMA), Kim    . Other abnormal blood chemistry 02/03/2007   Qualifier: Diagnosis of  By: Debby BudNorins MD, Rosalyn GessMichael E     Patient Active Problem List   Diagnosis Date Noted  . Mild mitral valve stenosis 04/24/2017  . Diastolic dysfunction 04/24/2017  . Abnormal CXR 04/24/2017  . Right hip pain 10/24/2016  . Low back pain  10/24/2016  . Cough 04/26/2016  . Aortic stenosis, mild 04/26/2016  . Impaired glucose tolerance 04/06/2014  . Essential hypertension, benign 10/02/2013  . Chronic pain syndrome 10/02/2013  . GERD (gastroesophageal reflux disease) 10/02/2013  . Routine health maintenance 05/28/2011  . DEGENERATIVE JOINT DISEASE, LUMBAR SPINE 01/06/2007  . FASCIITIS, PLANTAR 01/06/2007    Past Surgical History:  Procedure Laterality Date  . BLEPHAROPLASTY  2006   Bilateral  . CATARACT EXTRACTION  2006   bilateral  . plantar fascilitis    . ROTATOR CUFF REPAIR  2007   right  . TONSILLECTOMY          Home Medications    Prior to Admission medications   Medication Sig Start Date End Date Taking? Authorizing Provider  amLODipine (NORVASC) 5 MG tablet Take 1 tablet (5 mg total) by mouth daily. 10/24/16  Yes Corwin LevinsJohn, James W, MD  benzonatate (TESSALON PERLES) 100 MG capsule Take 1 capsule (100 mg total) by mouth 3 (three) times daily as needed for cough. 04/24/17 04/24/18 Yes Corwin LevinsJohn, James W, MD  cholecalciferol (VITAMIN D) 1000 UNITS tablet Take 1,000 Units by mouth daily.   Yes [provider]  HYDROcodone-acetaminophen (NORCO) 7.5-325 MG tablet Take 1 tablet by mouth every 6 (six) hours as needed for moderate pain. 04/24/17  Yes Corwin LevinsJohn, James W, MD  Omega-3 Fatty Acids (FISH OIL) 1000 MG CAPS Take 1 capsule (1,000 mg total)  by mouth daily. 08/07/12  Yes Norins, Rosalyn Gess, MD  omeprazole (PRILOSEC) 20 MG capsule TAKE 1 CAPSULE EVERY DAY AS NEEDED Patient taking differently: Take 20 mg by mouth daily as needed (for acid reflux).  10/24/16  Yes Corwin Levins, MD  amiloride-hydrochlorothiazide (MODURETIC) 5-50 MG tablet Take 1 tablet by mouth daily. Patient not taking: Reported on 06/11/2017 10/24/16   Corwin Levins, MD    Family History Family History  Problem Relation Age of Onset  . Other Father        Killed in [employer] [place]  . Other Mother        Diphtheria  . Other Brother        Killed in MVA 10-Jul-2022  .  Other Sister        Died at [employer]  . Prostate cancer Neg Hx   . Colon cancer Neg Hx     Social History Social History   Tobacco Use  . Smoking status: Former Games developer  . Smokeless tobacco: Never Used  . Tobacco comment: >50 years ago.  Substance Use Topics  . Alcohol use: Yes    Comment: occ beer  . Drug use: No     Allergies   Patient has no known allergies.   Review of Systems Review of Systems  Constitutional: Positive for fatigue and fever. Negative for chills.  Respiratory: Negative for shortness of breath.   Cardiovascular: Negative for chest pain.  Gastrointestinal: Negative for abdominal pain, nausea and vomiting.  Genitourinary: Negative for dysuria.  Musculoskeletal: Positive for back pain.  Neurological: Negative for weakness and numbness.  All other systems reviewed and are negative.    Physical Exam Updated Vital Signs BP 133/76   Pulse (!) 102   Temp (S) (!) 103.4 F (39.7 C) (Rectal)   Resp (!) 24   Ht 5\' 8"  (1.727 m)   Wt 74.8 kg (165 lb)   SpO2 93%   BMI 25.09 kg/m   Physical Exam  Constitutional: He is oriented to person, place, and time.  Elderly, nontoxic-appearing  HENT:  Head: Normocephalic and atraumatic.  Cardiovascular: Normal rate, regular rhythm and normal heart sounds.  No murmur heard. Pulmonary/Chest: Effort normal. No respiratory distress. He has no wheezes. He has rales.  Coarse breath sounds in all lung fields, rales left lower lobe, normal respiratory rate, no increased work of breathing  Abdominal: Soft. There is no tenderness. There is no rebound.  Musculoskeletal: He exhibits no edema.  Neurological: He is alert and oriented to person, place, and time.  Cranial nerves II through XII intact, fluent speech, 5 out of 5 strength in the bilateral upper and lower extremities, patient with difficulty sitting upright without assistance  Skin: Skin is warm and dry.  Diaphoretic  Psychiatric: He has a normal mood and affect.    Nursing note and vitals reviewed.    ED Treatments / Results  Labs (all labs ordered are listed, but only abnormal results are displayed) Labs Reviewed  COMPREHENSIVE METABOLIC PANEL - Abnormal; Notable for the following components:      Result Value   Sodium 133 (*)    Potassium 3.0 (*)    Chloride 97 (*)    Glucose, Bld 151 (*)    BUN 21 (*)    Calcium 8.8 (*)    Total Protein 6.4 (*)    ALT 14 (*)    Total Bilirubin 1.9 (*)    GFR calc non Af Amer 57 (*)    All  other components within normal limits  CBC WITH DIFFERENTIAL/PLATELET - Abnormal; Notable for the following components:   RBC 4.03 (*)    Hemoglobin 12.6 (*)    HCT 37.0 (*)    Neutro Abs 9.7 (*)    Lymphs Abs 0.4 (*)    All other components within normal limits  CULTURE, BLOOD (ROUTINE X 2)  CULTURE, BLOOD (ROUTINE X 2)  PROTIME-INR  URINALYSIS, ROUTINE W REFLEX MICROSCOPIC  INFLUENZA PANEL BY PCR (TYPE A & B)  I-STAT CG4 LACTIC ACID, ED    EKG EKG Interpretation  Date/Time:  Tuesday June 11 2017 06:15:21 EDT Ventricular Rate:  101 PR Interval:    QRS Duration: 89 QT Interval:  316 QTC Calculation: 410 R Axis:   -7 Text Interpretation:  Sinus tachycardia Prolonged PR interval LVH with secondary repolarization abnormality Baseline wander in lead(s) V3 V4 V5 V6 Confirmed by Ross Marcus ([id number]) on 06/11/2017 6:17:45 AM   Radiology Dg Chest 2 View  Result Date: 06/11/2017 CLINICAL DATA:  84 y/o  M; weakness and chronic back pain. EXAM: CHEST - 2 VIEW COMPARISON:  04/24/2017 chest radiographs FINDINGS: Stable normal cardiac silhouette given projection and technique. Aortic atherosclerosis with calcification. Patchy consolidations in the right greater than left lung base. Small right pleural effusion. No acute osseous abnormality is evident. IMPRESSION: Patchy consolidations in right greater than left lung bases and small right effusion. Findings probably represent pneumonia. Electronically Signed   By:  Mitzi Hansen M.D.   On: 06/11/2017 06:17   Dg Lumbar Spine Complete  Result Date: 06/11/2017 CLINICAL DATA:  84 y/o  M; weakness chronic back pain. EXAM: LUMBAR SPINE - COMPLETE 4+ VIEW COMPARISON:  10/24/2016 lumbar spine radiographs FINDINGS: Stable mild levocurvature with apex at T12-L1. normal lumbar lordosis without listhesis. Multilevel multilevel lumbar spondylosis loss of disc space height greatest at L1-2 and L2-3. Abdominal aortic atherosclerosis. Vertebral body heights are preserved. IMPRESSION: 1.  No acute bony or articular abnormality identified. 2. Stable lumbar spine levocurvature and moderate multilevel spondylosis. Electronically Signed   By: Mitzi Hansen M.D.   On: 06/11/2017 06:21    Procedures Procedures (including critical care time)  CRITICAL CARE Performed by: Shon Baton   Total critical care time: 35 minutes  Critical care time was exclusive of separately billable procedures and treating other patients.  Critical care was necessary to treat or prevent imminent or life-threatening deterioration.  Critical care was time spent personally by me on the following activities: development of treatment plan with patient and/or surrogate as well as nursing, discussions with consultants, evaluation of patient's response to treatment, examination of patient, obtaining history from patient or surrogate, ordering and performing treatments and interventions, ordering and review of laboratory studies, ordering and review of radiographic studies, pulse oximetry and re-evaluation of patient's condition.   Medications Ordered in ED Medications  cefTRIAXone (ROCEPHIN) 1 g in sodium chloride 0.9 % 100 mL IVPB (1 g Intravenous New Bag/Given 06/11/17 0635)  azithromycin (ZITHROMAX) 500 mg in sodium chloride 0.9 % 250 mL IVPB (500 mg Intravenous New Bag/Given 06/11/17 6045)  potassium chloride 10 mEq in 100 mL IVPB (has no administration in time range)  sodium  chloride 0.9 % bolus 1,000 mL (1,000 mLs Intravenous New Bag/Given 06/11/17 4098)    And  sodium chloride 0.9 % bolus 1,000 mL (1,000 mLs Intravenous New Bag/Given 06/11/17 1191)  acetaminophen (TYLENOL) tablet 1,000 mg (1,000 mg Oral Given 06/11/17 0636)     Initial Impression / Assessment and Plan /  ED Course  I have reviewed the triage vital signs and the nursing notes.  Pertinent labs & imaging results that were available during my care of the patient were reviewed by me and considered in my medical decision making (see chart for details).     Presents with generalized weakness and unable who need to sit up on his own.  Warm tactilely.  Noted to have a temperature 103.4 rectally.  Sepsis alert initiated.  He is otherwise nontoxic appearing.  Mildly tachycardic.  He is requiring some supplemental oxygen but is in no respiratory distress.  He is not hypotensive.  Lactate is normal.  Lab work notable for mild hyponatremia and hypokalemia.  Additionally, chest x-ray is concerning for bilateral patchy consolidations.  No risk factors for healthcare acquired pneumonia.  Influenza screen sent.  Patient was given Rocephin and azithromycin.  Given oxygen requirement, he will require hospitalization.  Potassium was also supplemented with IV potassium.  Family was updated at the bedside.  Final Clinical Impressions(s) / ED Diagnoses   Final diagnoses:  Community acquired pneumonia of right lower lobe of lung (HCC)  Generalized weakness  Hypokalemia    ED Discharge Orders    None       [place] Gustafson, Mayer Masker, MD 06/11/17 [phone]

## 2017-06-11 NOTE — ED Notes (Signed)
Meal tray ordered for patient.

## 2017-06-12 DIAGNOSIS — J189 Pneumonia, unspecified organism: Secondary | ICD-10-CM | POA: Diagnosis not present

## 2017-06-12 DIAGNOSIS — M1990 Unspecified osteoarthritis, unspecified site: Secondary | ICD-10-CM | POA: Diagnosis present

## 2017-06-12 DIAGNOSIS — K219 Gastro-esophageal reflux disease without esophagitis: Secondary | ICD-10-CM | POA: Diagnosis present

## 2017-06-12 DIAGNOSIS — R739 Hyperglycemia, unspecified: Secondary | ICD-10-CM | POA: Diagnosis present

## 2017-06-12 DIAGNOSIS — R000 Tachycardia, unspecified: Secondary | ICD-10-CM | POA: Diagnosis present

## 2017-06-12 DIAGNOSIS — G894 Chronic pain syndrome: Secondary | ICD-10-CM

## 2017-06-12 DIAGNOSIS — R531 Weakness: Secondary | ICD-10-CM | POA: Diagnosis not present

## 2017-06-12 DIAGNOSIS — R7302 Impaired glucose tolerance (oral): Secondary | ICD-10-CM | POA: Diagnosis present

## 2017-06-12 DIAGNOSIS — E860 Dehydration: Secondary | ICD-10-CM | POA: Diagnosis present

## 2017-06-12 DIAGNOSIS — I110 Hypertensive heart disease with heart failure: Secondary | ICD-10-CM | POA: Diagnosis present

## 2017-06-12 DIAGNOSIS — Z79899 Other long term (current) drug therapy: Secondary | ICD-10-CM | POA: Diagnosis not present

## 2017-06-12 DIAGNOSIS — Z87891 Personal history of nicotine dependence: Secondary | ICD-10-CM | POA: Diagnosis not present

## 2017-06-12 DIAGNOSIS — E876 Hypokalemia: Secondary | ICD-10-CM | POA: Diagnosis present

## 2017-06-12 DIAGNOSIS — I5032 Chronic diastolic (congestive) heart failure: Secondary | ICD-10-CM | POA: Diagnosis present

## 2017-06-12 DIAGNOSIS — J181 Lobar pneumonia, unspecified organism: Secondary | ICD-10-CM | POA: Diagnosis present

## 2017-06-12 DIAGNOSIS — J9601 Acute respiratory failure with hypoxia: Secondary | ICD-10-CM | POA: Diagnosis present

## 2017-06-12 DIAGNOSIS — R32 Unspecified urinary incontinence: Secondary | ICD-10-CM | POA: Diagnosis present

## 2017-06-12 LAB — BASIC METABOLIC PANEL
Anion gap: 11 (ref 5–15)
BUN: 14 mg/dL (ref 6–20)
CALCIUM: 8.2 mg/dL — AB (ref 8.9–10.3)
CO2: 24 mmol/L (ref 22–32)
CREATININE: 0.84 mg/dL (ref 0.61–1.24)
Chloride: 103 mmol/L (ref 101–111)
Glucose, Bld: 104 mg/dL — ABNORMAL HIGH (ref 65–99)
Potassium: 3 mmol/L — ABNORMAL LOW (ref 3.5–5.1)
SODIUM: 138 mmol/L (ref 135–145)

## 2017-06-12 LAB — MAGNESIUM: MAGNESIUM: 1.8 mg/dL (ref 1.7–2.4)

## 2017-06-12 LAB — CBC
HCT: 33.5 % — ABNORMAL LOW (ref 39.0–52.0)
Hemoglobin: 11.2 g/dL — ABNORMAL LOW (ref 13.0–17.0)
MCH: 30.9 pg (ref 26.0–34.0)
MCHC: 33.4 g/dL (ref 30.0–36.0)
MCV: 92.5 fL (ref 78.0–100.0)
PLATELETS: 145 10*3/uL — AB (ref 150–400)
RBC: 3.62 MIL/uL — ABNORMAL LOW (ref 4.22–5.81)
RDW: 13.1 % (ref 11.5–15.5)
WBC: 10.8 10*3/uL — AB (ref 4.0–10.5)

## 2017-06-12 LAB — URINE CULTURE: CULTURE: NO GROWTH

## 2017-06-12 LAB — PROCALCITONIN: PROCALCITONIN: 3.62 ng/mL

## 2017-06-12 LAB — GLUCOSE, CAPILLARY
GLUCOSE-CAPILLARY: 172 mg/dL — AB (ref 65–99)
Glucose-Capillary: 95 mg/dL (ref 65–99)

## 2017-06-12 MED ORDER — POTASSIUM CHLORIDE 10 MEQ/100ML IV SOLN
10.0000 meq | INTRAVENOUS | Status: AC
  Administered 2017-06-12 (×4): 10 meq via INTRAVENOUS
  Filled 2017-06-12 (×3): qty 100

## 2017-06-12 MED ORDER — POTASSIUM CHLORIDE 10 MEQ/100ML IV SOLN
INTRAVENOUS | Status: AC
  Filled 2017-06-12: qty 100

## 2017-06-12 MED ORDER — POTASSIUM CHLORIDE CRYS ER 20 MEQ PO TBCR
40.0000 meq | EXTENDED_RELEASE_TABLET | Freq: Once | ORAL | Status: AC
  Administered 2017-06-12: 40 meq via ORAL
  Filled 2017-06-12: qty 2

## 2017-06-12 NOTE — Progress Notes (Signed)
Patient and his son refused lovenox at 2200. Patient is alert and oriented, complain of back pain and percoce given and pain was relieved no acute distress noted.

## 2017-06-12 NOTE — Evaluation (Signed)
Occupational Therapy Evaluation Patient Details Name: Christopher Tran MRN: [id number] DOB: 03-Nov-1925 Today's Date: 06/12/2017    History of Present Illness Christopher Tran is a 84 y.o. male with a Past Medical History of HTN and chronic but progressively debilitating back pain who presents with not feeling well.  He reports 3-6 months of progressive decline which he appears to primarily attribute to limited mobility in the setting of severe back pain. He was admitted with CAP with fever, tachycardia and tachypnea.   Clinical Impression   PTA, pt was independent with ADL and functional mobility. He was motivated to participate with OT and cleared for OOB by RN. Pt currently limited by diminished activity tolerance for ADL and lower back pain. Pt requiring min guard assist for toilet transfers and supervision for grooming tasks at sink and LB ADL participation. Pt would benefit from continued OT services while admitted to improve independence and safety with ADL and functional mobility as well as for education concerning compensatory strategies to minimize back pain during ADL. Recommend initial 24 hour assistance post-acute D/C. OT will continue to follow while admitted.     Follow Up Recommendations  No OT follow up;Supervision/Assistance - 24 hour    Equipment Recommendations  None recommended by OT    Recommendations for Other Services       Precautions / Restrictions Precautions Precautions: Fall Restrictions Weight Bearing Restrictions: No      Mobility Bed Mobility Overal bed mobility: Needs Assistance Bed Mobility: Supine to Sit;Sit to Supine     Supine to sit: Supervision Sit to supine: Supervision   General bed mobility comments: Supervision for safety.   Transfers Overall transfer level: Needs assistance Equipment used: None Transfers: Sit to/from Stand Sit to Stand: Supervision         General transfer comment: cues for hand placement    Balance  Overall balance assessment: Needs assistance Sitting-balance support: No upper extremity supported;Feet supported Sitting balance-Leahy Scale: Good     Standing balance support: No upper extremity supported;During functional activity Standing balance-Leahy Scale: Fair Standing balance comment: Min guard assist for dynamic tasks.                            ADL either performed or assessed with clinical judgement   ADL Overall ADL's : Needs assistance/impaired Eating/Feeding: Set up;Sitting   Grooming: Supervision/safety;Standing   Upper Body Bathing: Set up;Sitting   Lower Body Bathing: Supervison/ safety;Sit to/from stand   Upper Body Dressing : Set up;Sitting   Lower Body Dressing: Supervision/safety;Sit to/from stand   Toilet Transfer: Min guard;Ambulation Toilet Transfer Details (indicate cue type and reason): Reaching for furniture at times and moving slowly. Toileting- Clothing Manipulation and Hygiene: Supervision/safety;Sit to/from stand       Functional mobility during ADLs: Min guard General ADL Comments: Pt with limited activity tolerance for ADL secondary to low back pain and fatiguing easily.     Vision Patient Visual Report: No change from baseline Vision Assessment?: No apparent visual deficits     Perception     Praxis      Pertinent Vitals/Pain Pain Assessment: Faces Faces Pain Scale: Hurts little more Pain Location: lower back Pain Descriptors / Indicators: Aching;Dull;Guarding Pain Intervention(s): Limited activity within patient's tolerance;Monitored during session;Repositioned     Hand Dominance     Extremity/Trunk Assessment Upper Extremity Assessment Upper Extremity Assessment: Overall WFL for tasks assessed   Lower Extremity Assessment Lower Extremity Assessment: Overall [facility]FL  for tasks assessed   Cervical / Trunk Assessment Cervical / Trunk Assessment: Lordotic   Communication Communication Communication: No  difficulties   Cognition Arousal/Alertness: Awake/alert Behavior During Therapy: WFL for tasks assessed/performed Overall Cognitive Status: Within Functional Limits for tasks assessed                                     General Comments  Grandson present during session. Pt's wife underwent surgical procedure yesterday and is currently admitted as well.     Exercises     Shoulder Instructions      Home Living Family/patient expects to be discharged to:: Private residence Living Arrangements: Spouse/significant other Available Help at Discharge: Family Type of Home: House Home Access: Level entry     Home Layout: One level     Bathroom Shower/Tub: Chief Strategy Officer: Handicapped height     Home Equipment: Environmental consultant - 2 wheels;Shower seat   Additional Comments: Wife also admitted for AVR      Prior Functioning/Environment Level of Independence: Independent        Comments: likes to work in yard        OT Problem List: Decreased strength;Decreased range of motion;Decreased activity tolerance;Impaired balance (sitting and/or standing);Decreased safety awareness;Decreased knowledge of use of DME or AE;Decreased knowledge of precautions;Pain      OT Treatment/Interventions: Self-care/ADL training;Therapeutic exercise;Energy conservation;DME and/or AE instruction;Therapeutic activities;Patient/family education;Balance training    OT Goals(Current goals can be found in the care plan section) Acute Rehab OT Goals Patient Stated Goal: To return to activities OT Goal Formulation: With patient Time For Goal Achievement: 06/26/17 Potential to Achieve Goals: Good ADL Goals Pt Will Perform Grooming: with modified independence;standing Pt Will Perform Lower Body Dressing: with modified independence;sit to/from stand(with compensatory strategies for low back pain) Pt Will Transfer to Toilet: with modified independence;ambulating(handicapped height  toilet) Pt Will Perform Toileting - Clothing Manipulation and hygiene: with modified independence;sit to/from stand Additional ADL Goal #1: Pt will utilize log roll technique for bed mobility with modified independence in preparation for seated ADL at EOB.  OT Frequency: Min 2X/week   Barriers to D/C:            Co-evaluation              AM-PAC PT "6 Clicks" Daily Activity     Outcome Measure Help from another person eating meals?: None Help from another person taking care of personal grooming?: A Little Help from another person toileting, which includes using toliet, bedpan, or urinal?: A Little Help from another person bathing (including washing, rinsing, drying)?: A Little Help from another person to put on and taking off regular upper body clothing?: A Little Help from another person to put on and taking off regular lower body clothing?: A Little 6 Click Score: 19   End of Session Equipment Utilized During Treatment: Gait belt Nurse Communication: Mobility status  Activity Tolerance: Patient tolerated treatment well Patient left: in chair;with call bell/phone within reach;with chair alarm set(RN cleared pt for OOB)  OT Visit Diagnosis: Other abnormalities of gait and mobility (R26.89);Pain Pain - part of body: (lower back)                Time: 6045-4098 OT Time Calculation (min): 22 min Charges:  OT General Charges $OT Visit: 1 Visit OT Evaluation $OT Eval Moderate Complexity: 1 Mod G-Codes:     Kien Mirsky A Haig Gerardo,  MS OTR/L  Pager: [phone]   Melroy Bougher A Tameshia Bonneville 06/12/2017, 9:20 AM

## 2017-06-12 NOTE — Progress Notes (Signed)
Physical Therapy Treatment Patient Details Name: Christopher Tran MRN: [id number] DOB: 06/12/1925 Today's Date: 06/12/2017    History of Present Illness Christopher Tran is a 84 y.o. male with a Past Medical History of HTN and chronic but progressively debilitating back pain who presents with not feeling well.  He reports 3-6 months of progressive decline which he appears to primarily attribute to limited mobility in the setting of severe back pain. He was admitted with CAP with fever, tachycardia and tachypnea.    PT Comments    Pt refused use of RW during treatment and denied use of RW during eval. Pt required min guard for ambulation with IV pole. Discussed with pt and son need for external support during ambulation. Pt states he has a cane at home, but does not seem agreeable to using it. Educated pt on benefits of OP PT after d/c for body mechanics and and ther ex for LBP. Pt seems reluctant to continue with PT after d/c. Plan to provide pt with HEP for LPB. Will continue to follow acutely.    Follow Up Recommendations  Outpatient PT;Supervision/Assistance - 24 hour     Equipment Recommendations  None recommended by PT    Recommendations for Other Services       Precautions / Restrictions Precautions Precautions: Fall Restrictions Weight Bearing Restrictions: No    Mobility  Bed Mobility               General bed mobility comments: in chair on arrival  Transfers Overall transfer level: Needs assistance Equipment used: None Transfers: Sit to/from Stand Sit to Stand: Supervision         General transfer comment: supervision for safety  Ambulation/Gait Ambulation/Gait assistance: Min guard Ambulation Distance (Feet): 500 Feet Assistive device: (IV pole) Gait Pattern/deviations: Step-through pattern;Decreased stride length Gait velocity: decreased Gait velocity interpretation: Below normal speed for age/gender General Gait Details: Pt denied use of RW  during eval and refused during treatment, stating he would just hold on to the IV pole. Pt required min guard for safety with IV pole. No overt LOB noted   Stairs            Wheelchair Mobility    Modified Rankin (Stroke Patients Only)       Balance Overall balance assessment: Needs assistance Sitting-balance support: No upper extremity supported;Feet supported Sitting balance-Leahy Scale: Good     Standing balance support: No upper extremity supported;During functional activity Standing balance-Leahy Scale: Fair Standing balance comment: Min guard assist for dynamic tasks.                             Cognition Arousal/Alertness: Awake/alert Behavior During Therapy: WFL for tasks assessed/performed Overall Cognitive Status: Within Functional Limits for tasks assessed                                        Exercises Other Exercises Other Exercises: educated on back precautions and benefits of continuing with OP PT for back pain relief.     General Comments General comments (skin integrity, edema, etc.): Son present during session      Pertinent Vitals/Pain Pain Assessment: No/denies pain    Home Living                      Prior Function  PT Goals (current goals can now be found in the care plan section) Acute Rehab PT Goals Patient Stated Goal: To return to activities PT Goal Formulation: With patient/family Time For Goal Achievement: 06/18/17 Potential to Achieve Goals: Good Progress towards PT goals: Progressing toward goals    Frequency           PT Plan Current plan remains appropriate    Co-evaluation              AM-PAC PT "6 Clicks" Daily Activity  Outcome Measure  Difficulty turning over in bed (including adjusting bedclothes, sheets and blankets)?: A Little   Difficulty sitting down on and standing up from a chair with arms (e.g., wheelchair, bedside commode, etc,.)?: A  Little Help needed moving to and from a bed to chair (including a wheelchair)?: A Little Help needed walking in hospital room?: A Little Help needed climbing 3-5 steps with a railing? : A Lot 6 Click Score: 14    End of Session Equipment Utilized During Treatment: Gait belt Activity Tolerance: Patient tolerated treatment well Patient left: with call bell/phone within reach;with family/visitor present;in chair Nurse Communication: Mobility status PT Visit Diagnosis: Other abnormalities of gait and mobility (R26.89);Pain     Time: [id number] PT Time Calculation (min) (ACUTE ONLY): 14 min  Charges:  $Gait Training: 8-22 mins                    G Codes:      Kallie LocksHannah Benedicto Capozzi, [place]PTA Pager [phone] Acute Rehab   Sheral ApleyHannah E Jerita Wimbush 06/12/2017, 3:16 PM

## 2017-06-12 NOTE — Care Management Note (Signed)
Case Management Note  Patient Details  Name: Christopher Tran MRN: [id number] Date of Birth: 17-Aug-1925  Subjective/Objective:       Admitted with CAP, hx of HTN and chronic back pain. Wife currently a pt on 2H.              Patricia PesaMarjorie Willet (Spouse) Veronda PrudeDavid Gastelum (Son)    [phone]    PCP: Oliver BarreJohn James  Action/Plan: PT recommends: Outpatient PT;Supervision/Assistance - 24 hour, however, family thinks pt will not commit to outpatient PT, states will f/u with NCM if pt is willing.  NCM  following for transitional care needs.    Expected Discharge Date:                  Expected Discharge Plan:  Home/Self Care  In-House Referral:     Discharge planning Services  CM Consult  Post Acute Care Choice:    Choice offered to:     DME Arranged:    DME Agency:     HH Arranged:    HH Agency:     Status of Service:  In process  If discussed at Long Length of Stay Meetings, dates discussed:    Additional Comments:  Epifanio LeschesCole, Remberto Lienhard Hudson, RN 06/12/2017, 4:11 PM

## 2017-06-12 NOTE — Progress Notes (Signed)
PROGRESS NOTE    Christopher Tran  ZOX:[id number] DOB: 1925/10/04 DOA: 06/11/2017 PCP: Corwin LevinsJohn, James W, MD   Brief Narrative: Patient is a 84 year old male with past medical history of hypertension, chronic pain syndrome, GERD who presented to the emergency department with complaints of worsening back pain, productive cough and shortness of breath.  Patient also found to have fever of 103.  Chest x-ray done in the emergency department showed patchy bilateral consolidation more on the right and a small right pleural effusion.  Patient was admitted for the management of community acquired pneumonia.  Assessment & Plan:   Principal Problem:   Pneumonia Active Problems:   Chronic pain syndrome   GERD (gastroesophageal reflux disease)   Impaired glucose tolerance   Diastolic dysfunction   Hypokalemia   Elevated bilirubin  Community-acquired pneumonia: Presented with cough, shortness of breath and fever.  Chest x-ray on admission showed stable multifocal infiltrates.  CURB.-65 score of 2 on admission. Patient was started on Rocephin and azithromycin.  Also started on steroids.  Started on gentle IV fluids. This morning, patient is very comfortable.  Currently saturating fine on room air.  Denies any cough or shortness of breath. Cultures have been negative so far. Continue bronchodilators as needed, Mucinex.   We hope to change antibiotics to oral tomorrow and discharge.  Chronic back pain: Added lidocaine patch for the back pain.  PT/OT evaluation done.  No specific recommendation.  Hyperglycemia:Much better now.  Hypokalemia: Continue supplement.  Will check level tomorrow.   DVT prophylaxis: Lovenox Code Status: Full Family Communication: Sons present on the bedside Disposition Plan: Likely home tomorrow   Consultants: None  Procedures:None  Antimicrobials: Ceftriaxone and azithromycin since 06/12/17  Subjective: Patient seen and examined the bedside this morning.  Remains  comfortable.  Denies any shortness of breath.  No overnight fever, nausea or vomiting   Objective: Vitals:   06/12/17 0549 06/12/17 1010 06/12/17 1405 06/12/17 1406  BP: 125/67 136/69 (!) 143/68 (!) 143/68  Pulse: (!) 50  (!) 59 62  Resp: 16   20  Temp: (!) 97.4 F (36.3 C)   97.8 F (36.6 C)  TempSrc: Oral     SpO2: 97%   99%  Weight: 74.2 kg (163 lb 9.3 oz)     Height:        Intake/Output Summary (Last 24 hours) at 06/12/2017 1428 Last data filed at 06/12/2017 1116 Gross per 24 hour  Intake 1717.92 ml  Output -  Net 1717.92 ml   Filed Weights   06/11/17 0600 06/12/17 0549  Weight: 74.8 kg (165 lb) 74.2 kg (163 lb 9.3 oz)    Examination:  General exam: Appears calm and comfortable ,Not in distress,average built HEENT:PERRL,Oral mucosa moist, Ear/Nose normal on gross exam Respiratory system: Bilateral equal air entry, normal vesicular breath sounds, no wheezes or crackles  Cardiovascular system: S1 & S2 heard, RRR. No JVD, murmurs, rubs, gallops or clicks. No pedal edema. Gastrointestinal system: Abdomen is nondistended, soft and nontender. No organomegaly or masses felt. Normal bowel sounds heard. Central nervous system: Alert and oriented. No focal neurological deficits. Extremities: No edema, no clubbing ,no cyanosis, distal peripheral pulses palpable. Skin: No rashes, lesions or ulcers,no icterus ,no pallor MSK: Normal muscle bulk,tone ,power Psychiatry: Judgement and insight appear normal. Mood & affect appropriate.     Data Reviewed: I have personally reviewed following labs and imaging studies  CBC: Recent Labs  Lab 06/11/17 0535 06/12/17 0704  WBC 10.5 10.8*  NEUTROABS 9.7*  --  HGB 12.6* 11.2*  HCT 37.0* 33.5*  MCV 91.8 92.5  PLT 163 145*   Basic Metabolic Panel: Recent Labs  Lab 06/11/17 0535 06/11/17 0906 06/12/17 0704  NA 133*  --  138  K 3.0*  --  3.0*  CL 97*  --  103  CO2 26  --  24  GLUCOSE 151*  --  104*  BUN 21*  --  14    CREATININE 1.10  --  0.84  CALCIUM 8.8*  --  8.2*  MG  --  1.6* 1.8   GFR: Estimated Creatinine Clearance: 57.7 mL/min (by C-G formula based on SCr of 0.84 mg/dL). Liver Function Tests: Recent Labs  Lab 06/11/17 0535  AST 18  ALT 14*  ALKPHOS 59  BILITOT 1.9*  PROT 6.4*  ALBUMIN 3.8   No results for input(s): LIPASE, AMYLASE in the last 168 hours. No results for input(s): AMMONIA in the last 168 hours. Coagulation Profile: Recent Labs  Lab 06/11/17 0535  INR 1.07   Cardiac Enzymes: No results for input(s): CKTOTAL, CKMB, CKMBINDEX, TROPONINI in the last 168 hours. BNP (last 3 results) No results for input(s): PROBNP in the last 8760 hours. HbA1C: Recent Labs    06/11/17 0906  HGBA1C 5.5   CBG: Recent Labs  Lab 06/11/17 1154 06/11/17 1635 06/11/17 2127 06/12/17 0851  GLUCAP 115* 141* 135* 95   Lipid Profile: No results for input(s): CHOL, HDL, LDLCALC, TRIG, CHOLHDL, LDLDIRECT in the last 72 hours. Thyroid Function Tests: No results for input(s): TSH, T4TOTAL, FREET4, T3FREE, THYROIDAB in the last 72 hours. Anemia Panel: No results for input(s): VITAMINB12, FOLATE, FERRITIN, TIBC, IRON, RETICCTPCT in the last 72 hours. Sepsis Labs: Recent Labs  Lab 06/11/17 0628 06/11/17 0906 06/11/17 0921 06/12/17 0704  PROCALCITON  --  2.97  --  3.62  LATICACIDVEN 1.49  --  0.74  --     Recent Results (from the past 240 hour(s))  Urine culture     Status: None   Collection Time: 06/11/17  8:05 AM  Result Value Ref Range Status   Specimen Description URINE, RANDOM  Final   Special Requests NONE  Final   Culture   Final    NO GROWTH Performed at [facility], [address] [place], [place] [address]    Report Status 06/12/2017 FINAL  Final         Radiology Studies: Dg Chest 2 View  Result Date: 06/11/2017 CLINICAL DATA:  84 y/o  M; weakness and chronic back pain. EXAM: CHEST - 2 VIEW COMPARISON:  04/24/2017 chest radiographs FINDINGS: Stable  normal cardiac silhouette given projection and technique. Aortic atherosclerosis with calcification. Patchy consolidations in the right greater than left lung base. Small right pleural effusion. No acute osseous abnormality is evident. IMPRESSION: Patchy consolidations in right greater than left lung bases and small right effusion. Findings probably represent pneumonia. Electronically Signed   By: Mitzi Hansen M.D.   On: 06/11/2017 06:17   Dg Lumbar Spine Complete  Result Date: 06/11/2017 CLINICAL DATA:  84 y/o  M; weakness chronic back pain. EXAM: LUMBAR SPINE - COMPLETE 4+ VIEW COMPARISON:  10/24/2016 lumbar spine radiographs FINDINGS: Stable mild levocurvature with apex at T12-L1. normal lumbar lordosis without listhesis. Multilevel multilevel lumbar spondylosis loss of disc space height greatest at L1-2 and L2-3. Abdominal aortic atherosclerosis. Vertebral body heights are preserved. IMPRESSION: 1.  No acute bony or articular abnormality identified. 2. Stable lumbar spine levocurvature and moderate multilevel spondylosis. Electronically Signed  By: Mitzi Hansen M.D.   On: 06/11/2017 06:21        Scheduled Meds: . amLODipine  5 mg Oral Daily  . enoxaparin (LOVENOX) injection  40 mg Subcutaneous Q24H  . lidocaine  1 patch Transdermal Q24H  . pantoprazole  40 mg Oral Daily  . predniSONE  20 mg Oral Q breakfast   Continuous Infusions: . sodium chloride 75 mL/hr at 06/12/17 1117  . azithromycin Stopped (06/12/17 1257)  . cefTRIAXone (ROCEPHIN)  IV Stopped (06/12/17 1610)     LOS: 0 days    Time spent:25 mins. More than 50% of that time was spent in counseling and/or coordination of care.      Burnadette Pop, MD [facility] Pager [phone]  If 7PM-7AM, please contact night-coverage [email or web address] Password [email or web address] 06/12/2017, 2:28 PM

## 2017-06-13 ENCOUNTER — Telehealth: Payer: Self-pay | Admitting: *Deleted

## 2017-06-13 DIAGNOSIS — J189 Pneumonia, unspecified organism: Secondary | ICD-10-CM

## 2017-06-13 LAB — CBC WITH DIFFERENTIAL/PLATELET
BASOS ABS: 0 10*3/uL (ref 0.0–0.1)
Basophils Relative: 0 %
EOS ABS: 0.1 10*3/uL (ref 0.0–0.7)
Eosinophils Relative: 1 %
HEMATOCRIT: 34.4 % — AB (ref 39.0–52.0)
HEMOGLOBIN: 11.8 g/dL — AB (ref 13.0–17.0)
LYMPHS PCT: 10 %
Lymphs Abs: 1 10*3/uL (ref 0.7–4.0)
MCH: 32.2 pg (ref 26.0–34.0)
MCHC: 34.3 g/dL (ref 30.0–36.0)
MCV: 93.7 fL (ref 78.0–100.0)
MONOS PCT: 6 %
Monocytes Absolute: 0.6 10*3/uL (ref 0.1–1.0)
Neutro Abs: 8.3 10*3/uL — ABNORMAL HIGH (ref 1.7–7.7)
Neutrophils Relative %: 83 %
Platelets: 202 10*3/uL (ref 150–400)
RBC: 3.67 MIL/uL — ABNORMAL LOW (ref 4.22–5.81)
RDW: 13.7 % (ref 11.5–15.5)
WBC: 10 10*3/uL (ref 4.0–10.5)

## 2017-06-13 LAB — BASIC METABOLIC PANEL
Anion gap: 10 (ref 5–15)
BUN: 13 mg/dL (ref 6–20)
CHLORIDE: 105 mmol/L (ref 101–111)
CO2: 26 mmol/L (ref 22–32)
Calcium: 8.8 mg/dL — ABNORMAL LOW (ref 8.9–10.3)
Creatinine, Ser: 0.85 mg/dL (ref 0.61–1.24)
GFR calc Af Amer: >60 mL/min (ref >60)
GFR calc non Af Amer: >60 mL/min (ref >60)
GLUCOSE: 108 mg/dL — AB (ref 65–99)
POTASSIUM: 4 mmol/L (ref 3.5–5.1)
Sodium: 141 mmol/L (ref 135–145)

## 2017-06-13 LAB — PROCALCITONIN: Procalcitonin: 2.22 ng/mL

## 2017-06-13 LAB — GLUCOSE, CAPILLARY: GLUCOSE-CAPILLARY: 96 mg/dL (ref 65–99)

## 2017-06-13 MED ORDER — GUAIFENESIN ER 600 MG PO TB12: 600.0000 mg | ORAL_TABLET | Freq: Two times a day (BID) | ORAL | 0 refills | Status: DC | PRN

## 2017-06-13 MED ORDER — LIDOCAINE 5 % EX PTCH: 1.0000 | MEDICATED_PATCH | CUTANEOUS | 0 refills | Status: DC

## 2017-06-13 MED ORDER — CEFDINIR 300 MG PO CAPS: 300.0000 mg | ORAL_CAPSULE | Freq: Two times a day (BID) | ORAL | 0 refills | Status: DC

## 2017-06-13 NOTE — Telephone Encounter (Signed)
Transition Care Management Follow-up Telephone Call   Date discharged? 06/13/17   How have you been since you were released from the hospital? Called pt spoke w/son Christopher Hua(David) Christopher Tran was sitting beside him he states he is feeling alright   Do you understand why you were in the hospital? YES   Do you understand the discharge instructions? YES   Where were you discharged to? Home   Items Reviewed:  Medications reviewed: YES, son states need to go pick up antibiotic that was called in today. Just really got him home and settle.   Allergies reviewed: YES  Dietary changes reviewed: YES  Referrals reviewed: No referral needed   Functional Questionnaire:   Activities of Daily Living (ADLs):   He states he are independent in the following: bathing and hygiene, feeding, continence, grooming, toileting and dressing States he require assistance with the following: ambulation   Any transportation issues/concerns?: NO   Any patient concerns? NO   Confirmed importance and date/time of follow-up visits scheduled YES, appt 06/20/17  Provider Appointment booked with Christopher Tran   Confirmed with patient if condition begins to worsen call PCP or go to the ER.  Patient was given the office number and encouraged to call back with question or concerns.  : YES

## 2017-06-13 NOTE — Progress Notes (Signed)
Helene KelpHerbert D Jaye to be D/C'd Home per MD order.  Discussed with the patient and all questions fully answered.  VSS, Skin clean, dry and intact without evidence of skin break down, no evidence of skin tears noted. IV catheter discontinued intact. Site without signs and symptoms of complications. Dressing and pressure applied.  An After Visit Summary was printed and given to the patient. Patient received prescription.  D/c education completed with patient/family including follow up instructions, medication list, d/c activities limitations if indicated, with other d/c instructions as indicated by MD - patient able to verbalize understanding, all questions fully answered.   Patient instructed to return to ED, call 911, or call MD for any changes in condition.   Patient D/C's off of floor with family. They refused escort.  Grayling Congressvan J Mackinzie Vuncannon 06/13/2017 11:50 AM

## 2017-06-13 NOTE — Discharge Summary (Signed)
Physician Discharge Summary  Christopher Tran WJX:[id number] DOB: 1925/08/19 DOA: 06/11/2017  PCP: Corwin Levins, MD  Admit date: 06/11/2017 Discharge date: 06/13/2017  Admitted From: Home Disposition:  Home  Discharge Condition: Stable CODE STATUS:Full Diet recommendation: Heart Healthy   Brief/Interim Summary: Patient is a 84 year old male with past medical history of hypertension, chronic pain syndrome, GERD who presented to the emergency department with complaints of worsening back pain, productive cough and shortness of breath.  Patient also found to have fever of 103.  Chest x-ray done in the emergency department showed patchy bilateral consolidation more on the right and a small right pleural effusion.  Patient was admitted for the management of community acquired pneumonia. Patient's respiratory status gradually improved after he was restarted on antibiotics.  His cough improved.  He remained afebrile and his white cell counts are stable.  Cultures have been negative so far. We changed antibiotics to oral today.  He is stable for discharge to home.  Following problems were addressed during his hospitalization:  Community-acquired pneumonia: Presented with cough, shortness of breath and fever.  Chest x-ray on admission showed stable multifocal infiltrates.  CURB.-65 score of 2 on admission. Patient was started on Rocephin and azithromycin.  Also started on steroids.  Started on gentle IV fluids. This morning, patient is very comfortable.  Currently saturating fine on room air.  Denies any cough or shortness of breath. Cultures have been negative so far. We chnaged antibiotics to oral today.  He will complete the course at home.  Chronic back pain: Added lidocaine patch for the back pain.  PT/OT evaluation done.  No specific recommendation. He will follow-up with his primary care physician for the management of his chronic back pain.  We have prescribed some lidocaine patch to go  with  home.  Hyperglycemia:Resolved.  Hypokalemia:  Supplemented and corrected.     Discharge Diagnoses:  Principal Problem:   Pneumonia Active Problems:   Chronic pain syndrome   GERD (gastroesophageal reflux disease)   Impaired glucose tolerance   Diastolic dysfunction   Hypokalemia   Elevated bilirubin    Discharge Instructions  Discharge Instructions    Diet - low sodium heart healthy   Complete by:  As directed    Discharge instructions   Complete by:  As directed    1) Please follow up with your PCP in a week. 2) Take prescribed medications as instructed.   Increase activity slowly   Complete by:  As directed      Allergies as of 06/13/2017   No Known Allergies     Medication List    TAKE these medications   amiloride-hydrochlorothiazide 5-50 MG tablet Commonly known as:  MODURETIC Take 1 tablet by mouth daily.   amLODipine 5 MG tablet Commonly known as:  NORVASC Take 1 tablet (5 mg total) by mouth daily.   benzonatate 100 MG capsule Commonly known as:  TESSALON PERLES Take 1 capsule (100 mg total) by mouth 3 (three) times daily as needed for cough.   cefdinir 300 MG capsule Commonly known as:  OMNICEF Take 1 capsule (300 mg total) by mouth 2 (two) times daily. Start taking on:  06/14/2017   cholecalciferol 1000 units tablet Commonly known as:  VITAMIN D Take 1,000 Units by mouth daily.   Fish Oil 1000 MG Caps Take 1 capsule (1,000 mg total) by mouth daily.   guaiFENesin 600 MG 12 hr tablet Commonly known as:  MUCINEX Take 1 tablet (600 mg total) by mouth  2 (two) times daily as needed for to loosen phlegm.   HYDROcodone-acetaminophen 7.5-325 MG tablet Commonly known as:  NORCO Take 1 tablet by mouth every 6 (six) hours as needed for moderate pain.   lidocaine 5 % Commonly known as:  LIDODERM Place 1 patch onto the skin daily. Remove & Discard patch within 12 hours or as directed by MD Start taking on:  06/14/2017   omeprazole 20 MG  capsule Commonly known as:  PRILOSEC TAKE 1 CAPSULE EVERY DAY AS NEEDED What changed:    how much to take  how to take this  when to take this  reasons to take this  additional instructions      Follow-up Information    Corwin LevinsJohn, James W, MD. Schedule an appointment as soon as possible for a visit in 1 week(s).   Specialties:  Internal Medicine, Radiology Contact information: [address] [facility] [place] [id number] [phone]          No Known Allergies  Consultations: None  Procedures/Studies: Dg Chest 2 View  Result Date: 06/11/2017 CLINICAL DATA:  [age] y/o  M; weakness and chronic back pain. EXAM: CHEST - 2 VIEW COMPARISON:  04/24/2017 chest radiographs FINDINGS: Stable normal cardiac silhouette given projection and technique. Aortic atherosclerosis with calcification. Patchy consolidations in the right greater than left lung base. Small right pleural effusion. No acute osseous abnormality is evident. IMPRESSION: Patchy consolidations in right greater than left lung bases and small right effusion. Findings probably represent pneumonia. Electronically Signed   By: Mitzi HansenLance  Furusawa-Stratton M.D.   On: 06/11/2017 06:17   Dg Lumbar Spine Complete  Result Date: 06/11/2017 CLINICAL DATA:  84 y/o  M; weakness chronic back pain. EXAM: LUMBAR SPINE - COMPLETE 4+ VIEW COMPARISON:  10/24/2016 lumbar spine radiographs FINDINGS: Stable mild levocurvature with apex at T12-L1. normal lumbar lordosis without listhesis. Multilevel multilevel lumbar spondylosis loss of disc space height greatest at L1-2 and L2-3. Abdominal aortic atherosclerosis. Vertebral body heights are preserved. IMPRESSION: 1.  No acute bony or articular abnormality identified. 2. Stable lumbar spine levocurvature and moderate multilevel spondylosis. Electronically Signed   By: Mitzi HansenLance  Furusawa-Stratton M.D.   On: 06/11/2017 06:21    (Echo, Carotid, EGD, Colonoscopy, ERCP)    Subjective:   Discharge Exam: Vitals:    06/13/17 0451 06/13/17 0951  BP: (!) 146/92 (!) 145/69  Pulse: 75   Resp: 18   Temp: 98.1 F (36.7 C)   SpO2: 97%    Vitals:   06/12/17 1406 06/12/17 2216 06/13/17 0451 06/13/17 0951  BP: (!) 143/68 131/74 (!) 146/92 (!) 145/69  Pulse: 62 65 75   Resp: 20 18 18    Temp: 97.8 F (36.6 C) 98.5 F (36.9 C) 98.1 F (36.7 C)   TempSrc:      SpO2: 99% 97% 97%   Weight:   74.4 kg (164 lb)   Height:        General: Pt is alert, awake, not in acute distress Cardiovascular: RRR, S1/S2 +, no rubs, no gallops Respiratory: CTA bilaterally, no wheezing, no rhonchi Abdominal: Soft, NT, ND, bowel sounds + Extremities: no edema, no cyanosis    The results of significant diagnostics from this hospitalization (including imaging, microbiology, ancillary and laboratory) are listed below for reference.     Microbiology: Recent Results (from the past 240 hour(s))  Urine culture     Status: None   Collection Time: 06/11/17  8:05 AM  Result Value Ref Range Status   Specimen Description  URINE, RANDOM  Final   Special Requests NONE  Final   Culture   Final    NO GROWTH Performed at [facility], [address], [place], [place] [address]    Report Status 06/12/2017 FINAL  Final     Labs: BNP (last 3 results) No results for input(s): BNP in the last 8760 hours. Basic Metabolic Panel: Recent Labs  Lab 06/11/17 0535 06/11/17 0906 06/12/17 0704 06/13/17 0706  NA 133*  --  138 141  K 3.0*  --  3.0* 4.0  CL 97*  --  103 105  CO2 26  --  24 26  GLUCOSE 151*  --  104* 108*  BUN 21*  --  14 13  CREATININE 1.10  --  0.84 0.85  CALCIUM 8.8*  --  8.2* 8.8*  MG  --  1.6* 1.8  --    Liver Function Tests: Recent Labs  Lab 06/11/17 0535  AST 18  ALT 14*  ALKPHOS 59  BILITOT 1.9*  PROT 6.4*  ALBUMIN 3.8   No results for input(s): LIPASE, AMYLASE in the last 168 hours. No results for input(s): AMMONIA in the last 168 hours. CBC: Recent Labs  Lab 06/11/17 0535  06/12/17 0704 06/13/17 0706  WBC 10.5 10.8* 10.0  NEUTROABS 9.7*  --  8.3*  HGB 12.6* 11.2* 11.8*  HCT 37.0* 33.5* 34.4*  MCV 91.8 92.5 93.7  PLT 163 145* 202   Cardiac Enzymes: No results for input(s): CKTOTAL, CKMB, CKMBINDEX, TROPONINI in the last 168 hours. BNP: Invalid input(s): POCBNP CBG: Recent Labs  Lab 06/11/17 1635 06/11/17 2127 06/12/17 0851 06/12/17 2020 06/13/17 0746  GLUCAP 141* 135* 95 172* 96   D-Dimer No results for input(s): DDIMER in the last 72 hours. Hgb A1c Recent Labs    06/11/17 0906  HGBA1C 5.5   Lipid Profile No results for input(s): CHOL, HDL, LDLCALC, TRIG, CHOLHDL, LDLDIRECT in the last 72 hours. Thyroid function studies No results for input(s): TSH, T4TOTAL, T3FREE, THYROIDAB in the last 72 hours.  Invalid input(s): FREET3 Anemia work up No results for input(s): VITAMINB12, FOLATE, FERRITIN, TIBC, IRON, RETICCTPCT in the last 72 hours. Urinalysis    Component Value Date/Time   COLORURINE YELLOW 06/11/2017 0724   APPEARANCEUR CLEAR 06/11/2017 0724   LABSPEC 1.014 06/11/2017 0724   PHURINE 5.0 06/11/2017 0724   GLUCOSEU NEGATIVE 06/11/2017 0724   GLUCOSEU NEGATIVE 04/24/2017 1103   HGBUR SMALL (A) 06/11/2017 0724   BILIRUBINUR NEGATIVE 06/11/2017 0724   KETONESUR NEGATIVE 06/11/2017 0724   PROTEINUR NEGATIVE 06/11/2017 0724   UROBILINOGEN 0.2 04/24/2017 1103   NITRITE NEGATIVE 06/11/2017 0724   LEUKOCYTESUR NEGATIVE 06/11/2017 0724   Sepsis Labs Invalid input(s): PROCALCITONIN,  WBC,  LACTICIDVEN Microbiology Recent Results (from the past 240 hour(s))  Urine culture     Status: None   Collection Time: 06/11/17  8:05 AM  Result Value Ref Range Status   Specimen Description URINE, RANDOM  Final   Special Requests NONE  Final   Culture   Final    NO GROWTH Performed at [facility], [address], [place], [place] [address]    Report Status 06/12/2017 FINAL  Final     Time coordinating discharge: 35  minutes  SIGNED:   Burnadette Pop, MD  [facility] 06/13/2017, 11:01 AM Pager [phone]  If 7PM-7AM, please contact night-coverage [email or web address] Password [email or web address]

## 2017-06-14 NOTE — Consult Note (Signed)
            [facility] Primary [facility]  06/14/2017  Christopher Tran Jul 20, 1925 [id number]   Went to see patient at the bedside to identify possible discharge needsbuthe was already dischargedhomeperstaffreport.  Per chart review, patient was seen for cough, shortness of breath, worsening back pain and fever. He was admitted for the management of community acquired pneumonia.  Primary care provider's officeis listed as providingtransition of care (TOC)follow-up.   Patient has discharge instruction to follow-up with primary care provider in 1 week.   For additional questions please contact:  Karin GoldenLorraine A. Elizardo Chilson, BSN, RN-BC [facility] PRIMARY [facility] Navigator Cell: [phone]

## 2017-06-17 LAB — CULTURE, BLOOD (ROUTINE X 2)
CULTURE: NO GROWTH
Culture: NO GROWTH
SPECIAL REQUESTS: ADEQUATE
SPECIAL REQUESTS: ADEQUATE

## 2017-06-20 ENCOUNTER — Ambulatory Visit (INDEPENDENT_AMBULATORY_CARE_PROVIDER_SITE_OTHER): Payer: Medicare Other | Admitting: Internal Medicine

## 2017-06-20 ENCOUNTER — Encounter: Payer: Self-pay | Admitting: Internal Medicine

## 2017-06-20 VITALS — BP 136/80 | HR 85 | Temp 98.0°F | Ht 68.0 in | Wt 160.0 lb

## 2017-06-20 DIAGNOSIS — M545 Low back pain: Secondary | ICD-10-CM | POA: Diagnosis not present

## 2017-06-20 DIAGNOSIS — I10 Essential (primary) hypertension: Secondary | ICD-10-CM

## 2017-06-20 DIAGNOSIS — J189 Pneumonia, unspecified organism: Secondary | ICD-10-CM

## 2017-06-20 MED ORDER — LIDOCAINE 5 % EX PTCH: 1.0000 | MEDICATED_PATCH | CUTANEOUS | 0 refills | Status: DC

## 2017-06-20 MED ORDER — BENZONATATE 100 MG PO CAPS: 100.0000 mg | ORAL_CAPSULE | Freq: Three times a day (TID) | ORAL | 1 refills | Status: DC | PRN

## 2017-06-20 NOTE — Patient Instructions (Signed)
Please continue all other medications as before, and refills have been done if requested.  Please have the pharmacy call with any other refills you may need.  Please keep your appointments with your specialists as you may have planned    

## 2017-06-20 NOTE — Progress Notes (Signed)
Subjective:    Patient ID: Christopher Tran, male    DOB: 08-20-1925, [age] y.o.   MRN: [id number]  HPI  84 year old male with past medical history of hypertension,chronic pain syndrome, GERD who presented to the emergency department with complaints of worsening back pain, productive cough and shortness of breath. Patient also found to have fever of 103. Chest x-ray done in the emergency department showed patchy bilateral consolidation more on the right and a small right pleural effusion.Patient was admitted for the management of community acquired pneumonia and dehydration.   Patient's respiratory status gradually improved after he was restarted on antibiotics.  His cough improved.  He remained afebrile and his white cell counts are stable.  Cultures have been negative so far. antibiotics to oral and felt stable for discharge to home. Currently doing well, feels OK and stamina some improved, Currently saturating fine on room air. Denies any cough or shortness of breath.  Cultures have been negative to date.  Has finished outpt oral antibiotics.  Also for Chronic back pain:Addedlidocaine patch for the back pain., worked well and asks for refill. PT/OT evaluation done at hospn, buy no specific recommendation. Pt continues to have recurring LBP without change in severity, bowel or bladder change, fever, wt loss,  worsening LE pain/numbness/weakness.  No other interval hx or new complaints Past Medical History:  Diagnosis Date  . DEGENERATIVE JOINT DISEASE, LUMBAR SPINE 01/06/2007   Qualifier: Diagnosis of  By: Dance CMA (AAMA), Kim    . FASCIITIS, PLANTAR 01/06/2007   Qualifier: Diagnosis of  By: Dance CMA (AAMA), Kim    . GERD (gastroesophageal reflux disease) 10/02/2013  . HYPERTENSION 01/06/2007   Qualifier: Diagnosis of  By: Dance CMA (AAMA), Kim    . Other abnormal blood chemistry 02/03/2007   Qualifier: Diagnosis of  By: Debby BudNorins MD, Rosalyn GessMichael E    Past Surgical History:  Procedure Laterality  Date  . BLEPHAROPLASTY  2006   Bilateral  . CATARACT EXTRACTION  2006   bilateral  . plantar fascilitis    . ROTATOR CUFF REPAIR  2007   right  . TONSILLECTOMY      reports that he has quit smoking. He has never used smokeless tobacco. He reports that he drinks alcohol. He reports that he does not use drugs. family history includes Other in his brother, father, mother, and sister. No Known Allergies Current Outpatient Medications on File Prior to Visit  Medication Sig Dispense Refill  . amiloride-hydrochlorothiazide (MODURETIC) 5-50 MG tablet Take 1 tablet by mouth daily. 90 tablet 3  . amLODipine (NORVASC) 5 MG tablet Take 1 tablet (5 mg total) by mouth daily. 90 tablet 3  . cefdinir (OMNICEF) 300 MG capsule Take 1 capsule (300 mg total) by mouth 2 (two) times daily. 8 capsule 0  . cholecalciferol (VITAMIN D) 1000 UNITS tablet Take 1,000 Units by mouth daily.    Marland Kitchen. guaiFENesin (MUCINEX) 600 MG 12 hr tablet Take 1 tablet (600 mg total) by mouth 2 (two) times daily as needed for to loosen phlegm. 10 tablet 0  . HYDROcodone-acetaminophen (NORCO) 7.5-325 MG tablet Take 1 tablet by mouth every 6 (six) hours as needed for moderate pain. 120 tablet 0  . Omega-3 Fatty Acids (FISH OIL) 1000 MG CAPS Take 1 capsule (1,000 mg total) by mouth daily. 90 capsule 3  . omeprazole (PRILOSEC) 20 MG capsule TAKE 1 CAPSULE EVERY DAY AS NEEDED (Patient taking differently: Take 20 mg by mouth daily as needed (for acid reflux). ) 90  capsule 3   No current facility-administered medications on file prior to visit.    Review of Systems  Constitutional: Negative for other unusual diaphoresis or sweats HENT: Negative for ear discharge or swelling Eyes: Negative for other worsening visual disturbances Respiratory: Negative for stridor or other swelling  Gastrointestinal: Negative for worsening distension or other blood Genitourinary: Negative for retention or other urinary change Musculoskeletal: Negative for  other MSK pain or swelling Skin: Negative for color change or other new lesions Neurological: Negative for worsening tremors and other numbness  Psychiatric/Behavioral: Negative for worsening agitation or other fatigue All other system neg per pt    Objective:   Physical Exam BP 136/80   Pulse 85   Temp 98 F (36.7 C) (Oral)   Ht 5\' 8"  (1.727 m)   Wt 160 lb (72.6 kg)   SpO2 97%   BMI 24.33 kg/m  VS noted,  Constitutional: Pt appears in NAD HENT: Head: NCAT.  Right Ear: External ear normal.  Left Ear: External ear normal.  Eyes: . Pupils are equal, round, and reactive to light. Conjunctivae and EOM are normal Nose: without d/c or deformity Neck: Neck supple. Gross normal ROM Cardiovascular: Normal rate and regular rhythm.   Pulmonary/Chest: Effort normal and breath sounds without rales or wheezing.  Abd:  Soft, NT, ND, + BS, no organomegaly Neurological: Pt is alert. At baseline orientation, motor grossly intact Skin: Skin is warm. No rashes, other new lesions, no LE edema Psychiatric: Pt behavior is normal without agitation  No other exam findings    Assessment & Plan:

## 2017-06-21 ENCOUNTER — Encounter: Payer: Self-pay | Admitting: Internal Medicine

## 2017-06-21 NOTE — Assessment & Plan Note (Signed)
Clinically resolved, no further cxr or tx needed for now

## 2017-06-21 NOTE — Assessment & Plan Note (Signed)
stable overall by history and exam, recent data reviewed with pt, and pt to continue medical treatment as before,  to f/u any worsening symptoms or concerns BP Readings from Last 3 Encounters:  06/20/17 136/80  06/13/17 (!) 145/69  04/24/17 118/78

## 2017-06-21 NOTE — Assessment & Plan Note (Signed)
Improved pain with lidoderm, ok to continue

## 2017-06-21 NOTE — Assessment & Plan Note (Deleted)
Improved pain with lidoderm, ok to continue 

## 2017-06-24 ENCOUNTER — Telehealth: Payer: Self-pay

## 2017-06-24 NOTE — Telephone Encounter (Signed)
[facility]  Determination sent to scan

## 2017-09-23 ENCOUNTER — Telehealth: Payer: Self-pay | Admitting: Internal Medicine

## 2017-09-23 NOTE — Telephone Encounter (Signed)
Patient came by the office requesting for a [facility] [address] [place]

## 2017-09-23 NOTE — Telephone Encounter (Signed)
04/24/2017 120#   

## 2017-09-27 MED ORDER — HYDROCODONE-ACETAMINOPHEN 7.5-325 MG PO TABS: 1.0000 | ORAL_TABLET | Freq: Four times a day (QID) | ORAL | 0 refills | Status: DC | PRN

## 2017-09-27 NOTE — Telephone Encounter (Addendum)
Not sure what happened to his med refill request as I was out of the office  [facility]

## 2017-09-27 NOTE — Addendum Note (Signed)
Addended by: Corwin LevinsJOHN, JAMES W on: 09/27/2017 09:12 AM   Modules accepted: Orders

## 2017-09-27 NOTE — Telephone Encounter (Signed)
Patient called in upset stating that he came by the office on 7/22 for a refill for his hydrocodone. Patient states the [facility] [place]. Please advise.

## 2017-09-27 NOTE — Telephone Encounter (Signed)
Pt has been informed and expressed understanding.  

## 2017-10-11 DIAGNOSIS — W57XXXA Bitten or stung by nonvenomous insect and other nonvenomous arthropods, initial encounter: Secondary | ICD-10-CM | POA: Diagnosis not present

## 2017-10-11 DIAGNOSIS — Z91038 Other insect allergy status: Secondary | ICD-10-CM | POA: Diagnosis not present

## 2017-10-23 ENCOUNTER — Encounter: Payer: Self-pay | Admitting: Internal Medicine

## 2017-10-23 ENCOUNTER — Ambulatory Visit (INDEPENDENT_AMBULATORY_CARE_PROVIDER_SITE_OTHER): Payer: Medicare Other | Admitting: Internal Medicine

## 2017-10-23 ENCOUNTER — Other Ambulatory Visit (INDEPENDENT_AMBULATORY_CARE_PROVIDER_SITE_OTHER): Payer: Medicare Other

## 2017-10-23 VITALS — BP 114/76 | HR 61 | Temp 98.0°F | Ht 68.0 in | Wt 156.0 lb

## 2017-10-23 DIAGNOSIS — G894 Chronic pain syndrome: Secondary | ICD-10-CM | POA: Diagnosis not present

## 2017-10-23 DIAGNOSIS — I10 Essential (primary) hypertension: Secondary | ICD-10-CM

## 2017-10-23 DIAGNOSIS — R7302 Impaired glucose tolerance (oral): Secondary | ICD-10-CM | POA: Diagnosis not present

## 2017-10-23 LAB — LIPID PANEL
Cholesterol: 158 mg/dL (ref 0–200)
HDL: 50.3 mg/dL (ref >39.00)
LDL Cholesterol: 93 mg/dL (ref 0–99)
NonHDL: 107.56
TRIGLYCERIDES: 72 mg/dL (ref 0.0–149.0)
Total CHOL/HDL Ratio: 3
VLDL: 14.4 mg/dL (ref 0.0–40.0)

## 2017-10-23 LAB — CBC WITH DIFFERENTIAL/PLATELET
Basophils Absolute: 0.1 10*3/uL (ref 0.0–0.1)
Basophils Relative: 1.8 % (ref 0.0–3.0)
EOS PCT: 2.2 % (ref 0.0–5.0)
Eosinophils Absolute: 0.1 10*3/uL (ref 0.0–0.7)
HCT: 39.4 % (ref 39.0–52.0)
Hemoglobin: 13.4 g/dL (ref 13.0–17.0)
LYMPHS ABS: 1.1 10*3/uL (ref 0.7–4.0)
Lymphocytes Relative: 16.6 % (ref 12.0–46.0)
MCHC: 34 g/dL (ref 30.0–36.0)
MCV: 92.5 fl (ref 78.0–100.0)
MONOS PCT: 9.4 % (ref 3.0–12.0)
Monocytes Absolute: 0.6 10*3/uL (ref 0.1–1.0)
Neutro Abs: 4.5 10*3/uL (ref 1.4–7.7)
Neutrophils Relative %: 70 % (ref 43.0–77.0)
PLATELETS: 215 10*3/uL (ref 150.0–400.0)
RBC: 4.25 Mil/uL (ref 4.22–5.81)
RDW: 13.9 % (ref 11.5–15.5)
WBC: 6.5 10*3/uL (ref 4.0–10.5)

## 2017-10-23 LAB — BASIC METABOLIC PANEL
BUN: 26 mg/dL — ABNORMAL HIGH (ref 6–23)
CHLORIDE: 100 meq/L (ref 96–112)
CO2: 27 mEq/L (ref 19–32)
Calcium: 9.8 mg/dL (ref 8.4–10.5)
Creatinine, Ser: 0.9 mg/dL (ref 0.40–1.50)
GFR: 84.22 mL/min (ref >60.00)
Glucose, Bld: 108 mg/dL — ABNORMAL HIGH (ref 70–99)
POTASSIUM: 3.5 meq/L (ref 3.5–5.1)
SODIUM: 137 meq/L (ref 135–145)

## 2017-10-23 LAB — HEMOGLOBIN A1C: Hgb A1c MFr Bld: 5.8 % (ref 4.6–6.5)

## 2017-10-23 LAB — HEPATIC FUNCTION PANEL
ALBUMIN: 4.5 g/dL (ref 3.5–5.2)
ALT: 11 U/L (ref 0–53)
AST: 10 U/L (ref 0–37)
Alkaline Phosphatase: 61 U/L (ref 39–117)
BILIRUBIN DIRECT: 0.2 mg/dL (ref 0.0–0.3)
Total Bilirubin: 1.3 mg/dL — ABNORMAL HIGH (ref 0.2–1.2)
Total Protein: 7.1 g/dL (ref 6.0–8.3)

## 2017-10-23 LAB — TSH: TSH: 1.96 u[IU]/mL (ref 0.35–4.50)

## 2017-10-23 MED ORDER — HYDROCODONE-ACETAMINOPHEN 7.5-325 MG PO TABS: 1.0000 | ORAL_TABLET | Freq: Four times a day (QID) | ORAL | 0 refills | Status: DC | PRN

## 2017-10-23 NOTE — Progress Notes (Signed)
Subjective:    Patient ID: Christopher Tran, male    DOB: [birth year], 84 y.o.   MRN: [id number]  HPI  Here to f/u; overall doing ok,  Pt denies chest pain, increasing sob or doe, wheezing, orthopnea, PND, increased LE swelling, palpitations, dizziness or syncope.  Pt denies new neurological symptoms such as new headache, or facial or extremity weakness or numbness.  Pt denies polydipsia, polyuria, or low sugar episode.  Pt states overall good compliance with meds, mostly trying to follow appropriate diet, with wt overall stable,  but little exercise however. Pt continues to have recurring LBP without change in severity, bowel or bladder change, fever, wt loss,  worsening LE pain/numbness/weakness, gait change or falls. Tramadol did not help Past Medical History:  Diagnosis Date  . DEGENERATIVE JOINT DISEASE, LUMBAR SPINE 01/06/2007   Qualifier: Diagnosis of  By: Dance CMA (AAMA), Kim    . FASCIITIS, PLANTAR 01/06/2007   Qualifier: Diagnosis of  By: Dance CMA (AAMA), Kim    . GERD (gastroesophageal reflux disease) 10/02/2013  . HYPERTENSION 01/06/2007   Qualifier: Diagnosis of  By: Dance CMA (AAMA), Kim    . Other abnormal blood chemistry 02/03/2007   Qualifier: Diagnosis of  By: Debby BudNorins MD, Rosalyn GessMichael E    Past Surgical History:  Procedure Laterality Date  . BLEPHAROPLASTY  2006   Bilateral  . CATARACT EXTRACTION  2006   bilateral  . plantar fascilitis    . ROTATOR CUFF REPAIR  2007   right  . TONSILLECTOMY      reports that he has quit smoking. He has never used smokeless tobacco. He reports that he drinks alcohol. He reports that he does not use drugs. family history includes Other in his brother, father, mother, and sister. No Known Allergies Current Outpatient Medications on File Prior to Visit  Medication Sig Dispense Refill  . amiloride-hydrochlorothiazide (MODURETIC) 5-50 MG tablet Take 1 tablet by mouth daily. 90 tablet 3  . amLODipine (NORVASC) 5 MG tablet Take 1 tablet (5 mg  total) by mouth daily. 90 tablet 3  . cholecalciferol (VITAMIN D) 1000 UNITS tablet Take 1,000 Units by mouth daily.    Marland Kitchen. guaiFENesin (MUCINEX) 600 MG 12 hr tablet Take 1 tablet (600 mg total) by mouth 2 (two) times daily as needed for to loosen phlegm. 10 tablet 0  . lidocaine (LIDODERM) 5 % Place 1 patch onto the skin daily. Remove & Discard patch within 12 hours or as directed by MD 10 patch 0  . Omega-3 Fatty Acids (FISH OIL) 1000 MG CAPS Take 1 capsule (1,000 mg total) by mouth daily. 90 capsule 3  . omeprazole (PRILOSEC) 20 MG capsule TAKE 1 CAPSULE EVERY DAY AS NEEDED (Patient taking differently: Take 20 mg by mouth daily as needed (for acid reflux). ) 90 capsule 3   No current facility-administered medications on file prior to visit.    Review of Systems  Constitutional: Negative for other unusual diaphoresis or sweats HENT: Negative for ear discharge or swelling Eyes: Negative for other worsening visual disturbances Respiratory: Negative for stridor or other swelling  Gastrointestinal: Negative for worsening distension or other blood Genitourinary: Negative for retention or other urinary change Musculoskeletal: Negative for other MSK pain or swelling Skin: Negative for color change or other new lesions Neurological: Negative for worsening tremors and other numbness  Psychiatric/Behavioral: Negative for worsening agitation or other fatigue All other system neg per pt    Objective:   Physical Exam BP 114/76   Pulse  61   Temp 98 F (36.7 C) (Oral)   Ht 5\' 8"  (1.727 m)   Wt 156 lb (70.8 kg)   SpO2 98%   BMI 23.72 kg/m  VS noted,  Constitutional: Pt appears in NAD HENT: Head: NCAT.  Right Ear: External ear normal.  Left Ear: External ear normal.  Eyes: . Pupils are equal, round, and reactive to light. Conjunctivae and EOM are normal Nose: without d/c or deformity Neck: Neck supple. Gross normal ROM Cardiovascular: Normal rate and regular rhythm.   Pulmonary/Chest: Effort  normal and breath sounds without rales or wheezing.  Abd:  Soft, NT, ND, + BS, no organomegaly Spine: non tender in the midline Neurological: Pt is alert. At baseline orientation, motor grossly intact Skin: Skin is warm. No rashes, other new lesions, no LE edema Psychiatric: Pt behavior is normal without agitation  No other exam findings  Lab Results  Component Value Date   WBC 10.0 06/13/2017   HGB 11.8 (L) 06/13/2017   HCT 34.4 (L) 06/13/2017   PLT 202 06/13/2017   GLUCOSE 108 (H) 06/13/2017   CHOL 157 04/24/2017   TRIG 77.0 04/24/2017   HDL 43.40 04/24/2017   LDLCALC 99 04/24/2017   ALT 14 (L) 06/11/2017   AST 18 06/11/2017   NA 141 06/13/2017   K 4.0 06/13/2017   CL 105 06/13/2017   CREATININE 0.85 06/13/2017   BUN 13 06/13/2017   CO2 26 06/13/2017   TSH 2.39 04/24/2017   PSA 1.75 02/03/2007   INR 1.07 06/11/2017   HGBA1C 5.5 06/11/2017       Assessment & Plan:

## 2017-10-23 NOTE — Assessment & Plan Note (Signed)
stable overall by history and exam, recent data reviewed with pt, and pt to continue medical treatment as before,  to f/u any worsening symptoms or concerns BP Readings from Last 3 Encounters:  10/23/17 114/76  06/20/17 136/80  06/13/17 (!) 145/69

## 2017-10-23 NOTE — Patient Instructions (Signed)
Please continue all other medications as before, and refills have been done if requested - the next 2 months of hydrocodone  Please have the pharmacy call with any other refills you may need.  Please continue your efforts at being more active, low cholesterol diet, and weight control.  Please keep your appointments with your specialists as you may have planned  Please go to the LAB in the Basement (turn left off the elevator) for the tests to be done today  You will be contacted by phone if any changes need to be made immediately.  Otherwise, you will receive a letter about your results with an explanation, but please check with MyChart first.  Please remember to sign up for MyChart if you have not done so, as this will be important to you in the future with finding out test results, communicating by private email, and scheduling acute appointments online when needed.  Please return in 6 months, or sooner if needed

## 2017-10-23 NOTE — Assessment & Plan Note (Signed)
Ok for hydrocodone refill for now, minimize as he can, non surgical candidate

## 2017-10-23 NOTE — Assessment & Plan Note (Signed)
stable overall by history and exam, recent data reviewed with pt, and pt to continue medical treatment as before,  to f/u any worsening symptoms or concerns Lab Results  Component Value Date   HGBA1C 5.5 06/11/2017

## 2017-11-14 ENCOUNTER — Other Ambulatory Visit: Payer: Self-pay | Admitting: Internal Medicine

## 2018-01-07 ENCOUNTER — Ambulatory Visit (INDEPENDENT_AMBULATORY_CARE_PROVIDER_SITE_OTHER): Payer: Medicare Other | Admitting: *Deleted

## 2018-01-07 DIAGNOSIS — Z23 Encounter for immunization: Secondary | ICD-10-CM | POA: Diagnosis not present

## 2018-01-16 ENCOUNTER — Other Ambulatory Visit: Payer: Self-pay | Admitting: Internal Medicine

## 2018-01-16 MED ORDER — HYDROCODONE-ACETAMINOPHEN 7.5-325 MG PO TABS: 1.0000 | ORAL_TABLET | Freq: Four times a day (QID) | ORAL | 0 refills | Status: DC | PRN

## 2018-01-16 NOTE — Telephone Encounter (Signed)
Don erx 

## 2018-01-16 NOTE — Telephone Encounter (Signed)
Requested medication (s) are due for refill today: yes  Requested medication (s) are on the active medication list: yes    Last refill: 10/23/17  #120  0 refills  Future visit scheduled yes  04/29/2018  Notes to clinic:not delegated  Requested Prescriptions  Pending Prescriptions Disp Refills   HYDROcodone-acetaminophen (NORCO) 7.5-325 MG tablet 120 tablet 0    Sig: Take 1 tablet by mouth every 6 (six) hours as needed for moderate pain. To fill sept 24, 2019     Not Delegated - Analgesics:  Opioid Agonist Combinations Failed - 01/16/2018 10:15 AM      Failed - This refill cannot be delegated      Failed - Urine Drug Screen completed in last 360 days.      Passed - Valid encounter within last 6 months    Recent Outpatient Visits          2 months ago Impaired glucose tolerance   [facility] [place] [facility], James W, MD   7 months ago Pneumonia due to infectious organism, unspecified laterality, unspecified part of lung   [facility] [place] John, James W, MD   8 months ago Cough   [facility] John, James W, MD   1 year ago Right hip pain   [facility] John, James W, MD   1 year ago Cough   [facility] John, James W, MD      Future Appointments            In 3 months Jonny RuizJohn, Len BlalockJames W, MD [facility]

## 2018-01-16 NOTE — Telephone Encounter (Signed)
Copied from CRM [id number]. Topic: Quick Communication - Rx Refill/Question >> Jan 16, 2018 10:02 AM Floria RavelingStovall, Shana A wrote: Medication: HYDROcodone-acetaminophen (NORCO) 7.5-325 MG tablet [[id number]][[id number]]   Has the patient contacted their pharmacy? Yes.   (Agent: If no, request that the patient contact the pharmacy for the refill.) (Agent: If yes, when and what did the pharmacy advise?)   Preferred Pharmacy (with phone number or street name): [id number] [place], [place] - [address] AT [address] [phone] (Phone)  Agent: Please be advised that RX refills may take up to 3 business days. We ask that you follow-up with your pharmacy.

## 2018-01-31 ENCOUNTER — Other Ambulatory Visit: Payer: Self-pay | Admitting: Internal Medicine

## 2018-03-20 ENCOUNTER — Encounter: Payer: Self-pay | Admitting: Internal Medicine

## 2018-03-20 ENCOUNTER — Ambulatory Visit (INDEPENDENT_AMBULATORY_CARE_PROVIDER_SITE_OTHER): Payer: Medicare Other | Admitting: Internal Medicine

## 2018-03-20 ENCOUNTER — Other Ambulatory Visit: Payer: Self-pay | Admitting: Internal Medicine

## 2018-03-20 VITALS — BP 124/76 | HR 90 | Temp 97.8°F | Ht 68.0 in | Wt 161.0 lb

## 2018-03-20 DIAGNOSIS — M545 Low back pain, unspecified: Secondary | ICD-10-CM

## 2018-03-20 DIAGNOSIS — I350 Nonrheumatic aortic (valve) stenosis: Secondary | ICD-10-CM | POA: Diagnosis not present

## 2018-03-20 DIAGNOSIS — I10 Essential (primary) hypertension: Secondary | ICD-10-CM | POA: Diagnosis not present

## 2018-03-20 DIAGNOSIS — K4090 Unilateral inguinal hernia, without obstruction or gangrene, not specified as recurrent: Secondary | ICD-10-CM | POA: Diagnosis not present

## 2018-03-20 MED ORDER — HYDROCODONE-ACETAMINOPHEN 7.5-325 MG PO TABS: 1.0000 | ORAL_TABLET | Freq: Two times a day (BID) | ORAL | 0 refills | Status: DC | PRN

## 2018-03-20 MED ORDER — TIZANIDINE HCL 2 MG PO TABS: 2.0000 mg | ORAL_TABLET | Freq: Four times a day (QID) | ORAL | 1 refills | Status: DC | PRN

## 2018-03-20 NOTE — Assessment & Plan Note (Signed)
C/w likely msk strain, for muscle relaxer prn

## 2018-03-20 NOTE — Telephone Encounter (Signed)
Requested medication (s) are due for refill today: yes  Requested medication (s) are on the active medication list: yes  Last refill:  01/17/28  Future visit scheduled: yes  Notes to clinic:  Not delegated    Requested Prescriptions  Pending Prescriptions Disp Refills   HYDROcodone-acetaminophen (NORCO) 7.5-325 MG tablet 120 tablet 0    Sig: Take 1 tablet by mouth every 6 (six) hours as needed for moderate pain. To fill sept 24, 2019     Not Delegated - Analgesics:  Opioid Agonist Combinations Failed - 03/20/2018  2:02 PM      Failed - This refill cannot be delegated      Failed - Urine Drug Screen completed in last 360 days.      Passed - Valid encounter within last 6 months    Recent Outpatient Visits          Today Acute right-sided low back pain without sciatica   [facility]   4 months ago Impaired glucose tolerance   [facility], [facility]   9 months ago Pneumonia due to infectious organism, unspecified laterality, unspecified part of lung   [facility], [facility]   11 months ago Cough   [facility], [facility]   1 year ago Right hip pain   [facility], [facility]      Future Appointments            In 1 month Jonny Ruiz, Len Blalock, MD [facility], [facility]

## 2018-03-20 NOTE — Telephone Encounter (Signed)
Done erx 

## 2018-03-20 NOTE — Telephone Encounter (Signed)
Copied from CRM [id number]. Topic: Quick Communication - Rx Refill/Question >> Mar 20, 2018  1:52 PM Jaquita Rector A wrote: Medication: HYDROcodone-acetaminophen (NORCO) 7.5-325 MG tablet   Patient seen 03/20/2018 Rx not sent to pharmacy  Has the patient contacted their pharmacy? Yes.   (Agent: If no, request that the patient contact the pharmacy for the refill.) (Agent: If yes, when and what did the pharmacy advise?)  Preferred Pharmacy (with phone number or street name): [id number] [place], [place] - [address] AT [address] [phone] (Phone) [phone] (Fax)    Agent: Please be advised that RX refills may take up to 3 business days. We ask that you follow-up with your pharmacy.

## 2018-03-20 NOTE — Assessment & Plan Note (Signed)
With some increased symptoms, d/w pt he may not be eligible for surgury given his age, and he wants to see general surgury for opinion

## 2018-03-20 NOTE — Progress Notes (Signed)
Subjective:    Patient ID: Christopher Tran, male    DOB: January 20, 1926, 85 y.o.   MRN: [id number]  HPI  Here to f/u with grandson present, overall doing ok but c/o 1-2 mo worsening left inguinal discomfort with known left inguinal hernia; not sure if any larger but is large anyway;  He is able to press and reduce but uncomfortable now;  Denies worsening reflux, abd pain, dysphagia, n/v, bowel change or blood.   Pt denies fever, wt loss, night sweats, loss of appetite, or other constitutional symptoms  Also has pain recurring x 2-3 days to right lateral hip area and points to a localized area just above the ischium, without rash or swelling, mild to mod, intermittent, worse to bend or twist, no falls or known injury.  Denies urinary symptoms such as dysuria, frequency, urgency, flank pain, hematuria or n/v, fever, chills.    Pt denies polydipsia, polyuria Past Medical History:  Diagnosis Date  . DEGENERATIVE JOINT DISEASE, LUMBAR SPINE 01/06/2007   Qualifier: Diagnosis of  By: Dance CMA (AAMA), Kim    . FASCIITIS, PLANTAR 01/06/2007   Qualifier: Diagnosis of  By: Dance CMA (AAMA), Kim    . GERD (gastroesophageal reflux disease) 10/02/2013  . HYPERTENSION 01/06/2007   Qualifier: Diagnosis of  By: Dance CMA (AAMA), Kim    . Other abnormal blood chemistry 02/03/2007   Qualifier: Diagnosis of  By: Debby Bud MD, Rosalyn Gess    Past Surgical History:  Procedure Laterality Date  . BLEPHAROPLASTY  2006   Bilateral  . CATARACT EXTRACTION  2006   bilateral  . plantar fascilitis    . ROTATOR CUFF REPAIR  2007   right  . TONSILLECTOMY      reports that he has quit smoking. He has never used smokeless tobacco. He reports current alcohol use. He reports that he does not use drugs. family history includes Other in his brother, father, mother, and sister. No Known Allergies Current Outpatient Medications on File Prior to Visit  Medication Sig Dispense Refill  . amiloride-hydrochlorothiazide (MODURETIC) 5-50  MG tablet TAKE 1 TABLET EVERY DAY 90 tablet 0  . amLODipine (NORVASC) 5 MG tablet TAKE 1 TABLET EVERY DAY 90 tablet 3  . cholecalciferol (VITAMIN D) 1000 UNITS tablet Take 1,000 Units by mouth daily.    Marland Kitchen guaiFENesin (MUCINEX) 600 MG 12 hr tablet Take 1 tablet (600 mg total) by mouth 2 (two) times daily as needed for to loosen phlegm. 10 tablet 0  . HYDROcodone-acetaminophen (NORCO) 7.5-325 MG tablet Take 1 tablet by mouth every 6 (six) hours as needed for moderate pain. To fill sept 24, 2019 120 tablet 0  . lidocaine (LIDODERM) 5 % Place 1 patch onto the skin daily. Remove & Discard patch within 12 hours or as directed by MD 10 patch 0  . Omega-3 Fatty Acids (FISH OIL) 1000 MG CAPS Take 1 capsule (1,000 mg total) by mouth daily. 90 capsule 3  . omeprazole (PRILOSEC) 20 MG capsule TAKE 1 CAPSULE EVERY DAY AS NEEDED (Patient taking differently: Take 20 mg by mouth daily as needed (for acid reflux). ) 90 capsule 3   No current facility-administered medications on file prior to visit.    Review of Systems  Constitutional: Negative for other unusual diaphoresis or sweats HENT: Negative for ear discharge or swelling Eyes: Negative for other worsening visual disturbances Respiratory: Negative for stridor or other swelling  Gastrointestinal: Negative for worsening distension or other blood Genitourinary: Negative for retention or other urinary  change Musculoskeletal: Negative for other MSK pain or swelling Skin: Negative for color change or other new lesions Neurological: Negative for worsening tremors and other numbness  Psychiatric/Behavioral: Negative for worsening agitation or other fatigue All other system neg per pt    Objective:   Physical Exam BP 124/76   Pulse 90   Temp 97.8 F (36.6 C) (Oral)   Ht 5\' 8"  (1.727 m)   Wt 161 lb (73 kg)   SpO2 93%   BMI 24.48 kg/m  VS noted,  Constitutional: Pt appears in NAD HENT: Head: NCAT.  Right Ear: External ear normal.  Left Ear: External  ear normal.  Eyes: . Pupils are equal, round, and reactive to light. Conjunctivae and EOM are normal Nose: without d/c or deformity Neck: Neck supple. Gross normal ROM Cardiovascular: Normal rate and regular rhythm.  , has gr 2-3 sys murmur RUSB Pulmonary/Chest: Effort normal and breath sounds without rales or wheezing.  Abd:  Soft, NT, ND, + BS, no organomegaly Left inguinal with large reducible hernia mild tender  Neurological: Pt is alert. At baseline orientation, motor grossly intact Skin: Skin is warm. No rashes, other new lesions, no LE edema Psychiatric: Pt behavior is normal without agitation  No other exam findings Lab Results  Component Value Date   WBC 6.5 10/23/2017   HGB 13.4 10/23/2017   HCT 39.4 10/23/2017   PLT 215.0 10/23/2017   GLUCOSE 108 (H) 10/23/2017   CHOL 158 10/23/2017   TRIG 72.0 10/23/2017   HDL 50.30 10/23/2017   LDLCALC 93 10/23/2017   ALT 11 10/23/2017   AST 10 10/23/2017   NA 137 10/23/2017   K 3.5 10/23/2017   CL 100 10/23/2017   CREATININE 0.90 10/23/2017   BUN 26 (H) 10/23/2017   CO2 27 10/23/2017   TSH 1.96 10/23/2017   PSA 1.75 02/03/2007   INR 1.07 06/11/2017   HGBA1C 5.8 10/23/2017       Assessment & Plan:

## 2018-03-20 NOTE — Assessment & Plan Note (Signed)
Has ? Increased murmur, though no dizzy sob or CP, for echocardiogram f/u

## 2018-03-20 NOTE — Patient Instructions (Signed)
Please take all new medication as prescribed - the muscle relaxer as needed  You will be contacted regarding the referral for: Echocardiogram, and General Surgury  Please continue all other medications as before, and refills have been done if requested.  Please have the pharmacy call with any other refills you may need.  Please keep your appointments with your specialists as you may have planned

## 2018-03-20 NOTE — Assessment & Plan Note (Signed)
stable overall by history and exam, recent data reviewed with pt, and pt to continue medical treatment as before,  to f/u any worsening symptoms or concerns  

## 2018-04-01 ENCOUNTER — Encounter: Payer: Self-pay | Admitting: Internal Medicine

## 2018-04-01 ENCOUNTER — Ambulatory Visit (HOSPITAL_COMMUNITY): Payer: Medicare Other | Attending: Cardiology

## 2018-04-01 ENCOUNTER — Other Ambulatory Visit: Payer: Self-pay | Admitting: Internal Medicine

## 2018-04-01 DIAGNOSIS — I350 Nonrheumatic aortic (valve) stenosis: Secondary | ICD-10-CM

## 2018-04-21 ENCOUNTER — Other Ambulatory Visit: Payer: Self-pay | Admitting: Internal Medicine

## 2018-04-21 MED ORDER — HYDROCODONE-ACETAMINOPHEN 7.5-325 MG PO TABS: 1.0000 | ORAL_TABLET | Freq: Two times a day (BID) | ORAL | 0 refills | Status: DC | PRN

## 2018-04-21 NOTE — Telephone Encounter (Signed)
Copied from CRM [id number]. Topic: Quick Communication - Rx Refill/Question >> Apr 21, 2018  2:33 PM Gaynelle Adu wrote: Medication: HYDROcodone-acetaminophen (NORCO) 7.5-325 MG tablet  Has the patient contacted their pharmacy? Yes   Preferred Pharmacy (with phone number or street name): [id number] [place], [place] - [address] [phone] (Phone) [phone] (Fax)    Agent: Please be advised that RX refills may take up to 3 business days. We ask that you follow-up with your pharmacy.

## 2018-04-21 NOTE — Telephone Encounter (Signed)
Requested medication (s) are due for refill today: Yes  Requested medication (s) are on the active medication list: Yes  Last refill:  03/20/18  Future visit scheduled: No  Notes to clinic:  Unable to refill per protocol cannot delegate.     Requested Prescriptions  Pending Prescriptions Disp Refills   HYDROcodone-acetaminophen (NORCO) 7.5-325 MG tablet 60 tablet 0    Sig: Take 1 tablet by mouth 2 (two) times daily as needed for moderate pain or severe pain (chronic pain). To fill sept 24, 2019     Not Delegated - Analgesics:  Opioid Agonist Combinations Failed - 04/21/2018  2:37 PM      Failed - This refill cannot be delegated      Failed - Urine Drug Screen completed in last 360 days.      Passed - Valid encounter within last 6 months    Recent Outpatient Visits          1 month ago Acute right-sided low back pain without sciatica   [facility] -Clair Gulling, MD   6 months ago Impaired glucose tolerance   [facility] -Clair Gulling, MD   10 months ago Pneumonia due to infectious organism, unspecified laterality, unspecified part of lung   [facility] -Clair Gulling, MD   12 months ago Cough   [facility] -Clair Gulling, MD   1 year ago Right hip pain   [facility] -Clair Gulling, MD

## 2018-04-21 NOTE — Telephone Encounter (Signed)
Done erx 

## 2018-04-29 ENCOUNTER — Ambulatory Visit: Payer: Medicare Other | Admitting: Internal Medicine

## 2018-05-22 ENCOUNTER — Telehealth: Payer: Self-pay | Admitting: Internal Medicine

## 2018-05-22 ENCOUNTER — Other Ambulatory Visit: Payer: Self-pay | Admitting: Internal Medicine

## 2018-05-22 MED ORDER — HYDROCODONE-ACETAMINOPHEN 7.5-325 MG PO TABS: 1.0000 | ORAL_TABLET | Freq: Two times a day (BID) | ORAL | 0 refills | Status: DC | PRN

## 2018-05-22 NOTE — Telephone Encounter (Signed)
Done erx 

## 2018-05-22 NOTE — Addendum Note (Signed)
Addended by: Corwin Levins on: 05/22/2018 04:46 PM   Modules accepted: Orders

## 2018-05-22 NOTE — Telephone Encounter (Signed)
Copied from CRM [id number]. Topic: Quick Communication - Rx Refill/Question >> May 22, 2018  2:50 PM Fanny Bien wrote: Medication:HYDROcodone-acetaminophen [facility]) 7.5-325 MG tablet [[id number]]   Has the patient contacted their pharmacy? No Preferred Pharmacy (with phone number or street name):[id number] [place], [place] - [address] [phone] (Phone) [phone] (Fax)    Agent: Please be advised that RX refills may take up to 3 business days. We ask that you follow-up with your pharmacy.

## 2018-06-19 ENCOUNTER — Other Ambulatory Visit: Payer: Self-pay | Admitting: Internal Medicine

## 2018-06-19 MED ORDER — HYDROCODONE-ACETAMINOPHEN 7.5-325 MG PO TABS: 1.0000 | ORAL_TABLET | Freq: Two times a day (BID) | ORAL | 0 refills | Status: DC | PRN

## 2018-06-19 NOTE — Telephone Encounter (Signed)
Done erx 

## 2018-07-07 ENCOUNTER — Ambulatory Visit: Payer: Medicare Other | Admitting: Cardiology

## 2018-07-21 ENCOUNTER — Other Ambulatory Visit: Payer: Self-pay | Admitting: Internal Medicine

## 2018-07-21 MED ORDER — HYDROCODONE-ACETAMINOPHEN 7.5-325 MG PO TABS: 1.0000 | ORAL_TABLET | Freq: Two times a day (BID) | ORAL | 0 refills | Status: DC | PRN

## 2018-07-21 NOTE — Telephone Encounter (Signed)
[place] Controlled Database Checked Last filled: 06/21/18 # 60 LOV w/you: 03/20/18 Next appt w/you: None

## 2018-07-21 NOTE — Telephone Encounter (Signed)
Medication: HYDROcodone-acetaminophen (NORCO) 7.5-325 MG tablet      [id number] [place], [place] - [address]   [phone] (Phone)  [phone] (Fax)      Pt was advised that RX refills may take up to 3 business days. We ask that you follow-up with your pharmacy.

## 2018-07-21 NOTE — Telephone Encounter (Signed)
Done erx 

## 2018-08-13 ENCOUNTER — Other Ambulatory Visit: Payer: Self-pay | Admitting: Internal Medicine

## 2018-08-20 ENCOUNTER — Telehealth: Payer: Self-pay | Admitting: Internal Medicine

## 2018-08-20 MED ORDER — HYDROCODONE-ACETAMINOPHEN 7.5-325 MG PO TABS: 1.0000 | ORAL_TABLET | Freq: Two times a day (BID) | ORAL | 0 refills | Status: DC | PRN

## 2018-08-20 NOTE — Telephone Encounter (Signed)
HYDROcodone-acetaminophen [facility] 7.5mg   [facility] [place]

## 2018-08-20 NOTE — Telephone Encounter (Signed)
[facility]

## 2018-09-12 ENCOUNTER — Telehealth: Payer: Self-pay | Admitting: Internal Medicine

## 2018-09-12 NOTE — Telephone Encounter (Signed)
Called patient to schedule AWV; no answer. Will try to call patient again at a later time. SF

## 2018-09-19 ENCOUNTER — Other Ambulatory Visit: Payer: Self-pay | Admitting: Internal Medicine

## 2018-09-19 MED ORDER — HYDROCODONE-ACETAMINOPHEN 7.5-325 MG PO TABS: 1.0000 | ORAL_TABLET | Freq: Two times a day (BID) | ORAL | 0 refills | Status: DC | PRN

## 2018-09-19 NOTE — Telephone Encounter (Signed)
Medication: HYDROcodone-acetaminophen (NORCO) 7.5-325 MG tablet [[id number]]   Has the patient contacted their pharmacy? Yes  (Agent: If no, request that the patient contact the pharmacy for the refill.) (Agent: If yes, when and what did the pharmacy advise?)  Preferred Pharmacy (with phone number or street name): [id number] [place], [place] [address] [phone] (Phone) [phone] (Fax)    Agent: Please be advised that RX refills may take up to 3 business days. We ask that you follow-up with your pharmacy.

## 2018-09-19 NOTE — Telephone Encounter (Signed)
[place] Controlled Database Checked Last filled: 08/20/18 # 60 LOV w/you: 03/20/18 Next appt w/you: None

## 2018-09-19 NOTE — Telephone Encounter (Signed)
Done erx 

## 2018-10-20 ENCOUNTER — Telehealth: Payer: Self-pay | Admitting: Internal Medicine

## 2018-10-20 ENCOUNTER — Encounter: Payer: Self-pay | Admitting: Cardiology

## 2018-10-20 MED ORDER — HYDROCODONE-ACETAMINOPHEN 7.5-325 MG PO TABS: 1.0000 | ORAL_TABLET | Freq: Two times a day (BID) | ORAL | 0 refills | Status: DC | PRN

## 2018-10-20 NOTE — Progress Notes (Signed)
Referring-James John, MD Reason for referral-aortic stenosis  HPI: 85 year old male for evaluation of aortic stenosis at request of Marinell BlightJames Johnson, MD.  Echocardiogram January 2020 showed normal LV function, moderate right ventricular enlargement, moderate aortic stenosis with mean gradient 22 mmHg.  Patient denies dyspnea on exertion, orthopnea, PND, pedal edema, exertional chest pain or syncope.  Current Outpatient Medications  Medication Sig Dispense Refill   amiloride-hydrochlorothiazide (MODURETIC) 5-50 MG tablet TAKE 1 TABLET EVERY DAY 90 tablet 3   amLODipine (NORVASC) 5 MG tablet TAKE 1 TABLET EVERY DAY 90 tablet 3   cholecalciferol (VITAMIN D) 1000 UNITS tablet Take 1,000 Units by mouth daily.     guaiFENesin (MUCINEX) 600 MG 12 hr tablet Take 1 tablet (600 mg total) by mouth 2 (two) times daily as needed for to loosen phlegm. 10 tablet 0   HYDROcodone-acetaminophen (NORCO) 7.5-325 MG tablet Take 1 tablet by mouth 2 (two) times daily as needed for moderate pain or severe pain (chronic pain). 60 tablet 0   lidocaine (LIDODERM) 5 % Place 1 patch onto the skin daily. Remove & Discard patch within 12 hours or as directed by MD 10 patch 0   Omega-3 Fatty Acids (FISH OIL) 1000 MG CAPS Take 1 capsule (1,000 mg total) by mouth daily. 90 capsule 3   omeprazole (PRILOSEC) 20 MG capsule TAKE 1 CAPSULE EVERY DAY AS NEEDED (Patient taking differently: Take 20 mg by mouth daily as needed (for acid reflux). ) 90 capsule 3   tiZANidine (ZANAFLEX) 2 MG tablet TAKE 1 TABLET EVERY 6 HOURS AS NEEDED FOR MUSCLE SPASM(S) 40 tablet 1   No current facility-administered medications for this visit.     No Known Allergies   Past Medical History:  Diagnosis Date   Aortic stenosis    DEGENERATIVE JOINT DISEASE, LUMBAR SPINE 01/06/2007   Qualifier: Diagnosis of  By: Dance CMA (AAMA), Orvan JulyKim     FASCIITIS, PLANTAR 01/06/2007   Qualifier: Diagnosis of  By: Dance CMA (AAMA), Kim     GERD  (gastroesophageal reflux disease) 10/02/2013   HYPERTENSION 01/06/2007   Qualifier: Diagnosis of  By: Dance CMA (AAMA), Kim     Other abnormal blood chemistry 02/03/2007   Qualifier: Diagnosis of  By: Debby BudNorins MD, Rosalyn GessMichael E     Past Surgical History:  Procedure Laterality Date   BLEPHAROPLASTY  2006   Bilateral   CATARACT EXTRACTION  2006   bilateral   plantar fascilitis     ROTATOR CUFF REPAIR  2007   right   TONSILLECTOMY      Social History   Socioeconomic History   Marital status: Married    Spouse name: Not on file   Number of children: 1   Years of education: 12   Highest education level: Not on file  Occupational History   Occupation: Development worker, [employer]    Comment: retired  Ecologistocial Needs   Financial resource strain: Not on file   Food insecurity    Worry: Not on file    Inability: Not on Occupational hygienistfile   Transportation needs    Medical: Not on file    Non-medical: Not on file  Tobacco Use   Smoking status: Former Smoker   Smokeless tobacco: Never Used   Tobacco comment: >50 years ago.  Substance and Sexual Activity   Alcohol use: Yes    Comment: occ beer   Drug use: No   Sexual activity: Not Currently  Lifestyle   Physical activity    Days per week: Not on  file    Minutes per session: Not on file   Stress: Not on file  Relationships   Social connections    Talks on phone: Not on file    Gets together: Not on file    Attends religious service: Not on file    Active member of club or organization: Not on file    Attends meetings of clubs or organizations: Not on file    Relationship status: Not on file   Intimate partner violence    Fear of current or ex partner: Not on file    Emotionally abused: Not on file    Physically abused: Not on file    Forced sexual activity: Not on file  Other Topics Concern   Not on file  Social History Narrative   Married '57-she had nephrectomy for RCC-robotic surgery. 1 son - 51. 2 grandchildren. Retire -  [employer]. Keeps Busy. ACP/End of Life Care: No heroic measures of life support; i.e. DNR, no mechanical ventilation.     Family History  Problem Relation Age of Onset   Other Father        Killed in [place]   Other Mother        Diphtheria   Other Brother        Killed in [place] 2022-06-28   Other Sister        Died at [employer]   Prostate cancer Neg Hx    Colon cancer Neg Hx     ROS: Arthralgias but no fevers or chills, productive cough, hemoptysis, dysphasia, odynophagia, melena, hematochezia, dysuria, hematuria, rash, seizure activity, orthopnea, PND, pedal edema, claudication. Remaining systems are negative.  Physical Exam:   Blood pressure (!) 148/76, pulse 72, temperature 98.4 F (36.9 C), height 5\' 8"  (1.727 m), weight 168 lb (76.2 kg).  General:  Well developed/well nourished in NAD Skin warm/dry Patient not depressed No peripheral clubbing Back-normal HEENT-normal/normal eyelids Neck supple/normal carotid upstroke bilaterally; no bruits; no JVD; no thyromegaly chest - CTA/ normal expansion CV - RRR/normal S1 and S2; rubs or gallops;  PMI nondisplaced; 2/6 systolic murmur left sternal border. Abdomen -NT/ND, no HSM, no mass, + bowel sounds, no bruit 2+ femoral pulses, no bruits Ext-no edema, chords, 1+ DP Neuro-grossly nonfocal  ECG -sinus rhythm with first-degree AV block and PACs, cannot rule out prior inferior infarct.  Personally reviewed  A/P  1 Moderate AS-patient noted to have moderate aortic stenosis on previous echocardiogram.  He has no symptoms including no chest pain, dyspnea or syncope.  We discussed the symptoms to be aware of.  We will plan to repeat his echocardiogram in January to reassess.  I explained that he could potentially require aortic valve replacement in the future if his aortic stenosis progresses.  Would likely need to be TAVR.  2 Hypertension-BP controlled; continue present meds and follow.  Kirk Ruths, MD

## 2018-10-20 NOTE — Telephone Encounter (Signed)
See refill request for Norco.

## 2018-10-20 NOTE — Telephone Encounter (Signed)
Medication Refill - Medication:   HYDROcodone-acetaminophen (NORCO) 7.5-325 MG tablet    Preferred Pharmacy:  [id number] [place], [place] [address] [phone] (Phone) [phone] (Fax)     Agent: Please be advised that RX refills may take up to 3 business days. We ask that you follow-up with your pharmacy.

## 2018-10-20 NOTE — Telephone Encounter (Signed)
Done erx 

## 2018-10-23 ENCOUNTER — Other Ambulatory Visit: Payer: Self-pay | Admitting: Internal Medicine

## 2018-10-27 ENCOUNTER — Ambulatory Visit (INDEPENDENT_AMBULATORY_CARE_PROVIDER_SITE_OTHER): Payer: Medicare Other | Admitting: Cardiology

## 2018-10-27 ENCOUNTER — Other Ambulatory Visit: Payer: Self-pay

## 2018-10-27 ENCOUNTER — Encounter: Payer: Self-pay | Admitting: Cardiology

## 2018-10-27 VITALS — BP 148/76 | HR 72 | Temp 98.4°F | Ht 68.0 in | Wt 168.0 lb

## 2018-10-27 DIAGNOSIS — I350 Nonrheumatic aortic (valve) stenosis: Secondary | ICD-10-CM | POA: Diagnosis not present

## 2018-10-27 DIAGNOSIS — I10 Essential (primary) hypertension: Secondary | ICD-10-CM

## 2018-10-27 NOTE — Patient Instructions (Signed)
Medication Instructions:  NO CHANGE If you need a refill on your cardiac medications before your next appointment, please call your pharmacy.   Lab work: If you have labs (blood work) drawn today and your tests are completely normal, you will receive your results only by: . MyChart Message (if you have MyChart) OR . A paper copy in the mail If you have any lab test that is abnormal or we need to change your treatment, we will call you to review the results.  Testing/Procedures: Your physician has requested that you have an echocardiogram. Echocardiography is a painless test that uses sound waves to create images of your heart. It provides your doctor with information about the size and shape of your heart and how well your heart's chambers and valves are working. This procedure takes approximately one hour. There are no restrictions for this procedure.[address]    Follow-Up: At [facility], you and your health needs are our priority.  As part of our continuing mission to provide you with exceptional heart care, we have created designated Provider Care Teams.  These Care Teams include your primary Cardiologist (physician) and Advanced Practice Providers (APPs -  Physician Assistants and Nurse Practitioners) who all work together to provide you with the care you need, when you need it. You will need a follow up appointment in 6 months.  Please call our office 2 months in advance to schedule this appointment.  You may see BRIAN CRENSHAW MD or one of the following Advanced Practice Providers on your designated Care Team:   Luke Kilroy, PA-C Krista Kroeger, PA-C . Callie Goodrich, PA-C     

## 2018-11-20 ENCOUNTER — Telehealth: Payer: Self-pay | Admitting: Internal Medicine

## 2018-11-20 MED ORDER — HYDROCODONE-ACETAMINOPHEN 7.5-325 MG PO TABS: 1.0000 | ORAL_TABLET | Freq: Two times a day (BID) | ORAL | 0 refills | Status: DC | PRN

## 2018-11-20 NOTE — Telephone Encounter (Signed)
Pt called in to request a refill for HYDROcodone-acetaminophen ([place]) 7.5-325 MG tablet     [facility]  [id number] [place], [place] [address] [phone] (Phone) [phone] (Fax)

## 2018-11-20 NOTE — Addendum Note (Signed)
Addended by: Biagio Borg on: 11/20/2018 03:26 PM   Modules accepted: Orders

## 2018-11-20 NOTE — Telephone Encounter (Signed)
Done erx 

## 2018-11-25 ENCOUNTER — Other Ambulatory Visit: Payer: Self-pay

## 2018-11-25 ENCOUNTER — Ambulatory Visit (INDEPENDENT_AMBULATORY_CARE_PROVIDER_SITE_OTHER): Payer: Medicare Other

## 2018-11-25 DIAGNOSIS — Z23 Encounter for immunization: Secondary | ICD-10-CM

## 2018-12-22 ENCOUNTER — Telehealth: Payer: Self-pay | Admitting: Internal Medicine

## 2018-12-22 MED ORDER — HYDROCODONE-ACETAMINOPHEN 7.5-325 MG PO TABS: 1.0000 | ORAL_TABLET | Freq: Two times a day (BID) | ORAL | 0 refills | Status: DC | PRN

## 2018-12-22 NOTE — Telephone Encounter (Signed)
Done erx 

## 2018-12-22 NOTE — Telephone Encounter (Signed)
Medication: HYDROcodone-acetaminophen (NORCO) 7.5-325 MG tablet [[id number]]   Has the patient contacted their pharmacy? Yes  (Agent: If no, request that the patient contact the pharmacy for the refill.) (Agent: If yes, when and what did the pharmacy advise?)  Preferred Pharmacy (with phone number or street name): [id number] [place], [place] [address] [phone] (Phone) [phone] (Fax)   Agent: Please be advised that RX refills may take up to 3 business days. We ask that you follow-up with your pharmacy

## 2019-01-21 ENCOUNTER — Telehealth: Payer: Self-pay | Admitting: Internal Medicine

## 2019-01-21 MED ORDER — HYDROCODONE-ACETAMINOPHEN 7.5-325 MG PO TABS: 1.0000 | ORAL_TABLET | Freq: Two times a day (BID) | ORAL | 0 refills | Status: DC | PRN

## 2019-01-21 NOTE — Telephone Encounter (Signed)
Medication Refill - Medication: HYDROcodone-acetaminophen (NORCO) 7.5-325 MG tablet    Has the patient contacted their pharmacy? Yes.   (Agent: If no, request that the patient contact the pharmacy for the refill.) (Agent: If yes, when and what did the pharmacy advise?)  Preferred Pharmacy (with phone number or street name):  [id number] [place], [place] [address] [phone] (Phone) [phone] (Fax)     Agent: Please be advised that RX refills may take up to 3 business days. We ask that you follow-up with your pharmacy.

## 2019-01-21 NOTE — Telephone Encounter (Signed)
Done erx 

## 2019-02-20 ENCOUNTER — Other Ambulatory Visit: Payer: Self-pay | Admitting: Internal Medicine

## 2019-02-20 MED ORDER — HYDROCODONE-ACETAMINOPHEN 7.5-325 MG PO TABS: 1.0000 | ORAL_TABLET | Freq: Two times a day (BID) | ORAL | 0 refills | Status: DC | PRN

## 2019-02-20 NOTE — Telephone Encounter (Signed)
Done erx 

## 2019-02-20 NOTE — Telephone Encounter (Signed)
Medication Refill - Medication: HYDROcodone-acetaminophen (NORCO) 7.5-325 MG tablet   Preferred Pharmacy (with phone number or street name):  [id number] [place], [place] [address] Phone:  [phone]  Fax:  [phone]       Agent: Please be advised that RX refills may take up to 3 business days. We ask that you follow-up with your pharmacy.

## 2019-02-20 NOTE — Telephone Encounter (Signed)
Requested medication (s) are due for refill today: yes  Requested medication (s) are on the active medication list: yes  Last refill:  01/21/2019  Future visit scheduled: no  Notes to clinic:  refill cannot be delegated    Requested Prescriptions  Pending Prescriptions Disp Refills   HYDROcodone-acetaminophen (NORCO) 7.5-325 MG tablet 60 tablet 0    Sig: Take 1 tablet by mouth 2 (two) times daily as needed for moderate pain or severe pain (chronic pain).      Not Delegated - Analgesics:  Opioid Agonist Combinations Failed - 02/20/2019  9:25 AM      Failed - This refill cannot be delegated      Failed - Urine Drug Screen completed in last 360 days.      Failed - Valid encounter within last 6 months    Recent Outpatient Visits           11 months ago Acute right-sided low back pain without sciatica   [place] John, James W, MD   1 year ago Impaired glucose tolerance   [facility] -Georges Mouse, MD   1 year ago Pneumonia due to infectious organism, unspecified laterality, unspecified part of lung   [facility] -Georges Mouse, MD   1 year ago Cough   [facility] -Georges Mouse, MD   2 years ago Right hip pain   [facility] -Georges Mouse, MD

## 2019-03-24 ENCOUNTER — Other Ambulatory Visit: Payer: Self-pay

## 2019-03-24 ENCOUNTER — Ambulatory Visit (HOSPITAL_COMMUNITY): Payer: Medicare Other | Attending: Cardiovascular Disease

## 2019-03-24 DIAGNOSIS — I350 Nonrheumatic aortic (valve) stenosis: Secondary | ICD-10-CM | POA: Diagnosis not present

## 2019-03-26 ENCOUNTER — Telehealth: Payer: Self-pay | Admitting: Internal Medicine

## 2019-03-26 NOTE — Telephone Encounter (Signed)
[facility] [address] [place].

## 2019-03-27 MED ORDER — HYDROCODONE-ACETAMINOPHEN 7.5-325 MG PO TABS: 1.0000 | ORAL_TABLET | Freq: Two times a day (BID) | ORAL | 0 refills | Status: DC | PRN

## 2019-03-27 NOTE — Addendum Note (Signed)
Addended by: Corwin Levins on: 03/27/2019 01:22 PM   Modules accepted: Orders

## 2019-03-27 NOTE — Telephone Encounter (Signed)
Done erx 

## 2019-04-09 ENCOUNTER — Telehealth: Payer: Self-pay | Admitting: *Deleted

## 2019-04-09 NOTE — Telephone Encounter (Signed)
A message was left,re: his follow up visit with Dr.Crenshaw 

## 2019-04-28 ENCOUNTER — Ambulatory Visit (INDEPENDENT_AMBULATORY_CARE_PROVIDER_SITE_OTHER): Payer: Medicare Other | Admitting: Internal Medicine

## 2019-04-28 ENCOUNTER — Encounter: Payer: Self-pay | Admitting: Internal Medicine

## 2019-04-28 VITALS — BP 146/82 | HR 80 | Temp 97.7°F | Ht 68.0 in | Wt 165.0 lb

## 2019-04-28 DIAGNOSIS — I350 Nonrheumatic aortic (valve) stenosis: Secondary | ICD-10-CM

## 2019-04-28 DIAGNOSIS — E538 Deficiency of other specified B group vitamins: Secondary | ICD-10-CM | POA: Diagnosis not present

## 2019-04-28 DIAGNOSIS — Z23 Encounter for immunization: Secondary | ICD-10-CM | POA: Diagnosis not present

## 2019-04-28 DIAGNOSIS — G894 Chronic pain syndrome: Secondary | ICD-10-CM | POA: Diagnosis not present

## 2019-04-28 DIAGNOSIS — E559 Vitamin D deficiency, unspecified: Secondary | ICD-10-CM | POA: Diagnosis not present

## 2019-04-28 DIAGNOSIS — I10 Essential (primary) hypertension: Secondary | ICD-10-CM

## 2019-04-28 DIAGNOSIS — R7302 Impaired glucose tolerance (oral): Secondary | ICD-10-CM

## 2019-04-28 MED ORDER — OMEPRAZOLE 20 MG PO CPDR: DELAYED_RELEASE_CAPSULE | ORAL | 3 refills | Status: DC

## 2019-04-28 MED ORDER — AMILORIDE-HYDROCHLOROTHIAZIDE 5-50 MG PO TABS: 1.0000 | ORAL_TABLET | Freq: Every day | ORAL | 3 refills | Status: DC

## 2019-04-28 MED ORDER — AMLODIPINE BESYLATE 5 MG PO TABS: 5.0000 mg | ORAL_TABLET | Freq: Every day | ORAL | 3 refills | Status: DC

## 2019-04-28 MED ORDER — HYDROCODONE-ACETAMINOPHEN 7.5-325 MG PO TABS: 1.0000 | ORAL_TABLET | Freq: Two times a day (BID) | ORAL | 0 refills | Status: DC | PRN

## 2019-04-28 NOTE — Assessment & Plan Note (Signed)
stable overall by history and exam, recent data reviewed with pt, and pt to continue medical treatment as before,  to f/u any worsening symptoms or concerns  

## 2019-04-28 NOTE — Assessment & Plan Note (Addendum)
stable overall by history and exam, recent data reviewed with pt, and pt to continue medical treatment as before,  to f/u any worsening symptoms or concerns  I spent 32 minutes  in preparing to see the patient by review of recent labs, imaging and procedures, obtaining and reviewing separately obtained history, communicating with the patient and family or caregiver, ordering medications, tests or procedures, and documenting clinical information in the EHR including the differential Dx, treatment, and any further evaluation and other management of aortic stenosis, chronic pain, hyperglycemia, HTN

## 2019-04-28 NOTE — Patient Instructions (Signed)
You had the Tdap tetanus shot today  Please continue all other medications as before, and refills have been done if requested.  Please have the pharmacy call with any other refills you may need.  Please continue your efforts at being more active, low cholesterol diet, and weight control.  You are otherwise up to date with prevention measures today.  Please keep your appointments with your specialists as you may have planned  Please go to the LAB at the blood drawing area for the tests to be done  You will be contacted by phone if any changes need to be made immediately.  Otherwise, you will receive a letter about your results with an explanation, but please check with MyChart first.  Please remember to sign up for MyChart if you have not done so, as this will be important to you in the future with finding out test results, communicating by private email, and scheduling acute appointments online when needed.  Please make an Appointment to return in 6 months, or sooner if needed 

## 2019-04-28 NOTE — Assessment & Plan Note (Signed)
chronc back related, stable, cont current tx

## 2019-04-28 NOTE — Progress Notes (Signed)
Subjective:    Patient ID: [place], male    DOB: 05-24-1925, 86 y.o.   MRN: [id number]  HPI Hedapre to f/u; overall doing ok,  Pt denies chest pain, increasing sob or doe, wheezing, orthopnea, PND, increased LE swelling, palpitations, dizziness or syncope.  Pt denies new neurological symptoms such as new headache, or facial or extremity weakness or numbness.  Pt denies polydipsia, polyuria, or low sugar episode.  Pt states overall good compliance with meds, mostly trying to follow appropriate diet, with wt overall stable,  but little exercise however. Pt continues to have recurring LBP without change in severity, bowel or bladder change, fever, wt loss,  worsening LE pain/numbness/weakness, gait change or falls. Due for Tdap Past Medical History:  Diagnosis Date  . Aortic stenosis   . DEGENERATIVE JOINT DISEASE, LUMBAR SPINE 01/06/2007   Qualifier: Diagnosis of  By: Dance CMA ([place]), Kim    . FASCIITIS, [place] 01/06/2007   Qualifier: Diagnosis of  By: Dance CMA ([place]), Kim    . GERD (gastroesophageal reflux disease) 10/02/2013  . HYPERTENSION 01/06/2007   Qualifier: Diagnosis of  By: Dance CMA ([place]), Kim    . Other abnormal blood chemistry 02/03/2007   Qualifier: Diagnosis of  By: Linda Hedges MD, Heinz Knuckles    Past Surgical History:  Procedure Laterality Date  . BLEPHAROPLASTY  2006   Bilateral  . CATARACT EXTRACTION  2006   bilateral  . plantar fascilitis    . ROTATOR CUFF REPAIR  2007   right  . TONSILLECTOMY      reports that he has quit smoking. He has never used smokeless tobacco. He reports current alcohol use. He reports that he does not use drugs. family history includes Other in his brother, father, mother, and sister. No Known Allergies Current Outpatient Medications on File Prior to Visit  Medication Sig Dispense Refill  . cholecalciferol (VITAMIN D) 1000 UNITS tablet Take 1,000 Units by mouth daily.    Marland Kitchen guaiFENesin (MUCINEX) 600 MG 12 hr tablet Take 1 tablet (600 mg  total) by mouth 2 (two) times daily as needed for to loosen phlegm. 10 tablet 0  . lidocaine (LIDODERM) 5 % Place 1 patch onto the skin daily. Remove & Discard patch within 12 hours or as directed by MD 10 patch 0  . Omega-3 Fatty Acids (FISH OIL) 1000 MG CAPS Take 1 capsule (1,000 mg total) by mouth daily. 90 capsule 3  . tiZANidine (ZANAFLEX) 2 MG tablet TAKE 1 TABLET EVERY 6 HOURS AS NEEDED FOR MUSCLE SPASM(S) 40 tablet 1   No current facility-administered medications on file prior to visit.   Review of Systems All otherwise neg per pt     Objective:   Physical Exam BP (!) 146/82 (BP Location: Left Arm, Patient Position: Sitting, Cuff Size: Normal)   Pulse 80   Temp 97.7 F (36.5 C) (Oral)   Ht 5\' 8"  (1.727 m)   Wt 165 lb (74.8 kg)   SpO2 98%   BMI 25.09 kg/m  VS noted,  Constitutional: Pt appears in NAD HENT: Head: NCAT.  Right Ear: External ear normal.  Left Ear: External ear normal.  Eyes: . Pupils are equal, round, and reactive to light. Conjunctivae and EOM are normal Nose: without d/c or deformity Neck: Neck supple. Gross normal ROM Cardiovascular: Normal rate and regular rhythm.  gr 2/6 sys murmur RUSB Pulmonary/Chest: Effort normal and breath sounds without rales or wheezing.  Abd:  Soft, NT, ND, + BS, no organomegaly  Neurological: Pt is alert. At baseline orientation, motor grossly intact Skin: Skin is warm. No rashes, other new lesions, no LE edema Psychiatric: Pt behavior is normal without agitation        Assessment & Plan:

## 2019-04-29 LAB — URINALYSIS, ROUTINE W REFLEX MICROSCOPIC
Bilirubin Urine: NEGATIVE
Hgb urine dipstick: NEGATIVE
Ketones, ur: NEGATIVE
Leukocytes,Ua: NEGATIVE
Nitrite: NEGATIVE
RBC / HPF: NONE SEEN (ref 0–?)
Specific Gravity, Urine: 1.02 (ref 1.000–1.030)
Total Protein, Urine: NEGATIVE
Urine Glucose: NEGATIVE
Urobilinogen, UA: 0.2 (ref 0.0–1.0)
pH: 6 (ref 5.0–8.0)

## 2019-04-29 LAB — CBC WITH DIFFERENTIAL/PLATELET
Basophils Absolute: 0.1 10*3/uL (ref 0.0–0.1)
Basophils Relative: 1.2 % (ref 0.0–3.0)
Eosinophils Absolute: 0.2 10*3/uL (ref 0.0–0.7)
Eosinophils Relative: 3.3 % (ref 0.0–5.0)
HCT: 39.8 % (ref 39.0–52.0)
Hemoglobin: 13.5 g/dL (ref 13.0–17.0)
Lymphocytes Relative: 20.5 % (ref 12.0–46.0)
Lymphs Abs: 1.4 10*3/uL (ref 0.7–4.0)
MCHC: 33.9 g/dL (ref 30.0–36.0)
MCV: 92.6 fl (ref 78.0–100.0)
Monocytes Absolute: 0.7 10*3/uL (ref 0.1–1.0)
Monocytes Relative: 10.6 % (ref 3.0–12.0)
Neutro Abs: 4.3 10*3/uL (ref 1.4–7.7)
Neutrophils Relative %: 64.4 % (ref 43.0–77.0)
Platelets: 224 10*3/uL (ref 150.0–400.0)
RBC: 4.3 Mil/uL (ref 4.22–5.81)
RDW: 13.4 % (ref 11.5–15.5)
WBC: 6.6 10*3/uL (ref 4.0–10.5)

## 2019-04-29 LAB — HEPATIC FUNCTION PANEL
ALT: 10 U/L (ref 0–53)
AST: 12 U/L (ref 0–37)
Albumin: 4.5 g/dL (ref 3.5–5.2)
Alkaline Phosphatase: 63 U/L (ref 39–117)
Bilirubin, Direct: 0.2 mg/dL (ref 0.0–0.3)
Total Bilirubin: 1.2 mg/dL (ref 0.2–1.2)
Total Protein: 7 g/dL (ref 6.0–8.3)

## 2019-04-29 LAB — LIPID PANEL
Cholesterol: 160 mg/dL (ref 0–200)
HDL: 41.2 mg/dL (ref >39.00)
LDL Cholesterol: 102 mg/dL — ABNORMAL HIGH (ref 0–99)
NonHDL: 119.02
Total CHOL/HDL Ratio: 4
Triglycerides: 85 mg/dL (ref 0.0–149.0)
VLDL: 17 mg/dL (ref 0.0–40.0)

## 2019-04-29 LAB — VITAMIN B12: Vitamin B-12: 547 pg/mL (ref 211–911)

## 2019-04-29 LAB — TSH: TSH: 2.08 u[IU]/mL (ref 0.35–4.50)

## 2019-04-29 LAB — BASIC METABOLIC PANEL
BUN: 21 mg/dL (ref 6–23)
CO2: 25 mEq/L (ref 19–32)
Calcium: 9.4 mg/dL (ref 8.4–10.5)
Chloride: 101 mEq/L (ref 96–112)
Creatinine, Ser: 0.96 mg/dL (ref 0.40–1.50)
GFR: 73.31 mL/min (ref >60.00)
Glucose, Bld: 124 mg/dL — ABNORMAL HIGH (ref 70–99)
Potassium: 3.5 mEq/L (ref 3.5–5.1)
Sodium: 138 mEq/L (ref 135–145)

## 2019-04-29 LAB — VITAMIN D 25 HYDROXY (VIT D DEFICIENCY, FRACTURES): VITD: 20.93 ng/mL — ABNORMAL LOW (ref 30.00–100.00)

## 2019-04-30 LAB — HEMOGLOBIN A1C: Hgb A1c MFr Bld: 5.7 % (ref 4.6–6.5)

## 2019-05-02 ENCOUNTER — Other Ambulatory Visit: Payer: Self-pay | Admitting: Internal Medicine

## 2019-05-02 ENCOUNTER — Encounter: Payer: Self-pay | Admitting: Internal Medicine

## 2019-05-02 MED ORDER — VITAMIN D (ERGOCALCIFEROL) 1.25 MG (50000 UNIT) PO CAPS: 50000.0000 [IU] | ORAL_CAPSULE | ORAL | 0 refills | Status: DC

## 2019-05-04 ENCOUNTER — Telehealth: Payer: Self-pay

## 2019-05-04 NOTE — Telephone Encounter (Signed)
Key: [id number]

## 2019-05-04 NOTE — Telephone Encounter (Signed)
PA has been approved.   LVM informing patient of same.

## 2019-05-27 ENCOUNTER — Telehealth: Payer: Self-pay

## 2019-05-27 NOTE — Telephone Encounter (Signed)
1.Medication Requested:HYDROcodone-acetaminophen (NORCO) 7.5-325 MG tablet  2. Pharmacy (Name[facility] Street, City[id number] [place], [place] - [address]  3. On Med List: Yes   4. Last Visit with PCP: 2.23.21  5. Next visit date with PCP: no appt is made at this time     Agent: Please be advised that RX refills may take up to 3 business days. We ask that you follow-up with your pharmacy.

## 2019-05-28 MED ORDER — HYDROCODONE-ACETAMINOPHEN 7.5-325 MG PO TABS: 1.0000 | ORAL_TABLET | Freq: Two times a day (BID) | ORAL | 0 refills | Status: DC | PRN

## 2019-05-28 NOTE — Telephone Encounter (Signed)
Done erx 

## 2019-07-02 ENCOUNTER — Telehealth: Payer: Self-pay | Admitting: Internal Medicine

## 2019-07-02 MED ORDER — HYDROCODONE-ACETAMINOPHEN 7.5-325 MG PO TABS: 1.0000 | ORAL_TABLET | Freq: Two times a day (BID) | ORAL | 0 refills | Status: DC | PRN

## 2019-07-02 NOTE — Telephone Encounter (Signed)
   1.Medication Requested: HYDROcodone-acetaminophen (NORCO) 7.5-325 MG tablet  2. Pharmacy (Name, Street, [phone] [place], [place] - [address]  3. On Med List: yes  4. Last Visit with PCP: [phone]  5. Next visit date with PCP:   Agent: Please be advised that RX refills may take up to 3 business days. We ask that you follow-up with your pharmacy.

## 2019-07-02 NOTE — Telephone Encounter (Signed)
Done erx 

## 2019-07-31 ENCOUNTER — Telehealth: Payer: Self-pay | Admitting: Internal Medicine

## 2019-07-31 NOTE — Telephone Encounter (Signed)
    1.Medication Requested: HYDROcodone-acetaminophen (NORCO) 7.5-325 MG tablet  2. Pharmacy (Name, Street, City):[id number] [place], [place] - [address] 3. On Med List: yes  4. Last Visit with PCP: 04/28/19  5. Next visit date with PCP: n/a   Agent: Please be advised that RX refills may take up to 3 business days. We ask that you follow-up with your pharmacy.

## 2019-08-04 ENCOUNTER — Other Ambulatory Visit: Payer: Self-pay | Admitting: Internal Medicine

## 2019-08-04 DIAGNOSIS — G894 Chronic pain syndrome: Secondary | ICD-10-CM

## 2019-08-04 DIAGNOSIS — M545 Low back pain, unspecified: Secondary | ICD-10-CM

## 2019-08-04 MED ORDER — HYDROCODONE-ACETAMINOPHEN 7.5-325 MG PO TABS: 1.0000 | ORAL_TABLET | Freq: Two times a day (BID) | ORAL | 0 refills | Status: DC | PRN

## 2019-09-01 ENCOUNTER — Telehealth: Payer: Self-pay | Admitting: Internal Medicine

## 2019-09-01 DIAGNOSIS — G894 Chronic pain syndrome: Secondary | ICD-10-CM

## 2019-09-01 DIAGNOSIS — M545 Low back pain, unspecified: Secondary | ICD-10-CM

## 2019-09-01 MED ORDER — HYDROCODONE-ACETAMINOPHEN 7.5-325 MG PO TABS: 1.0000 | ORAL_TABLET | Freq: Two times a day (BID) | ORAL | 0 refills | Status: DC | PRN

## 2019-09-01 NOTE — Telephone Encounter (Signed)
Done erx 

## 2019-09-01 NOTE — Telephone Encounter (Signed)
Sent to Dr. John. 

## 2019-09-01 NOTE — Telephone Encounter (Signed)
    1.Medication Requested:HYDROcodone-acetaminophen (NORCO) 7.5-325 MG tablet  2. Pharmacy (Name, Street, City):[id number] [place], [place] - [address]  3. On Med List: yes  4. Last Visit with PCP:   5. Next visit date with PCP:10/26/19  Agent: Please be advised that RX refills may take up to 3 business days. We ask that you follow-up with your pharmacy.

## 2019-09-30 ENCOUNTER — Telehealth: Payer: Self-pay

## 2019-09-30 DIAGNOSIS — M545 Low back pain, unspecified: Secondary | ICD-10-CM

## 2019-09-30 DIAGNOSIS — G894 Chronic pain syndrome: Secondary | ICD-10-CM

## 2019-09-30 MED ORDER — HYDROCODONE-ACETAMINOPHEN 7.5-325 MG PO TABS: 1.0000 | ORAL_TABLET | Freq: Two times a day (BID) | ORAL | 0 refills | Status: DC | PRN

## 2019-09-30 NOTE — Addendum Note (Signed)
Addended by: Corwin Levins on: 09/30/2019 03:19 PM   Modules accepted: Orders

## 2019-09-30 NOTE — Telephone Encounter (Signed)
Done erx 

## 2019-09-30 NOTE — Telephone Encounter (Signed)
1.Medication Requested:HYDROcodone-acetaminophen (NORCO) 7.5-325 MG tablet  2. Pharmacy (Name, Street, City):[id number] [place], [place] - [address]  3. On Med List: Yes   4. Last Visit with PCP: 2.23.21   5. Next visit date with PCP:8.23.21   Agent: Please be advised that RX refills may take up to 3 business days. We ask that you follow-up with your pharmacy.

## 2019-10-26 ENCOUNTER — Ambulatory Visit: Payer: Medicare Other | Admitting: Internal Medicine

## 2019-10-27 ENCOUNTER — Ambulatory Visit: Payer: Medicare Other | Admitting: Internal Medicine

## 2019-11-03 ENCOUNTER — Encounter: Payer: Self-pay | Admitting: Internal Medicine

## 2019-11-03 ENCOUNTER — Ambulatory Visit (INDEPENDENT_AMBULATORY_CARE_PROVIDER_SITE_OTHER): Payer: Medicare Other | Admitting: Internal Medicine

## 2019-11-03 ENCOUNTER — Other Ambulatory Visit: Payer: Self-pay

## 2019-11-03 VITALS — BP 150/70 | HR 58 | Temp 98.0°F | Ht 68.0 in | Wt 160.0 lb

## 2019-11-03 DIAGNOSIS — K219 Gastro-esophageal reflux disease without esophagitis: Secondary | ICD-10-CM

## 2019-11-03 DIAGNOSIS — I10 Essential (primary) hypertension: Secondary | ICD-10-CM

## 2019-11-03 DIAGNOSIS — R7302 Impaired glucose tolerance (oral): Secondary | ICD-10-CM

## 2019-11-03 MED ORDER — PANTOPRAZOLE SODIUM 40 MG PO TBEC: 40.0000 mg | DELAYED_RELEASE_TABLET | Freq: Every day | ORAL | 3 refills | Status: DC

## 2019-11-03 MED ORDER — AMLODIPINE BESYLATE 5 MG PO TABS: 5.0000 mg | ORAL_TABLET | Freq: Every day | ORAL | 3 refills | Status: DC

## 2019-11-03 NOTE — Patient Instructions (Signed)
Please continue all other medications as before, and refills have been done if requested.  Please have the pharmacy call with any other refills you may need.  Please continue your efforts at being more active, low cholesterol diet, and weight control.  Please keep your appointments with your specialists as you may have planned  Please make an Appointment to return in 6 months, or sooner if needed 

## 2019-11-03 NOTE — Progress Notes (Signed)
Subjective:    Patient ID: Christopher Tran, male    DOB: [birth year], 86 y.o.   MRN: [id number]  HPI  Here to f/u; overall doing ok,  Pt denies chest pain, increasing sob or doe, wheezing, orthopnea, PND, increased LE swelling, palpitations, dizziness or syncope.  Pt denies new neurological symptoms such as new headache, or facial or extremity weakness or numbness.  Pt denies polydipsia, polyuria, or low sugar episode.  Pt states overall good compliance with meds, mostly trying to follow appropriate diet, with wt overall stable,  but little exercise however.  Denies worsening reflux, abd pain, dysphagia, n/v, bowel change or blood.tolerating vit d Past Medical History:  Diagnosis Date  . Aortic stenosis   . DEGENERATIVE JOINT DISEASE, LUMBAR SPINE 01/06/2007   Qualifier: Diagnosis of  By: Dance CMA (AAMA), Kim    . FASCIITIS, PLANTAR 01/06/2007   Qualifier: Diagnosis of  By: Dance CMA (AAMA), Kim    . GERD (gastroesophageal reflux disease) 10/02/2013  . HYPERTENSION 01/06/2007   Qualifier: Diagnosis of  By: Dance CMA (AAMA), Kim    . Other abnormal blood chemistry 02/03/2007   Qualifier: Diagnosis of  By: Debby Bud MD, Rosalyn Gess    Past Surgical History:  Procedure Laterality Date  . BLEPHAROPLASTY  2006   Bilateral  . CATARACT EXTRACTION  2006   bilateral  . plantar fascilitis    . ROTATOR CUFF REPAIR  2007   right  . TONSILLECTOMY      reports that he has quit smoking. He has never used smokeless tobacco. He reports current alcohol use. He reports that he does not use drugs. family history includes Other in his brother, father, mother, and sister. No Known Allergies Current Outpatient Medications on File Prior to Visit  Medication Sig Dispense Refill  . amiloride-hydrochlorothiazide (MODURETIC) 5-50 MG tablet Take 1 tablet by mouth daily. 90 tablet 3  . cholecalciferol (VITAMIN D) 1000 UNITS tablet Take 1,000 Units by mouth daily.    Marland Kitchen guaiFENesin (MUCINEX) 600 MG 12 hr tablet Take 1  tablet (600 mg total) by mouth 2 (two) times daily as needed for to loosen phlegm. 10 tablet 0  . lidocaine (LIDODERM) 5 % Place 1 patch onto the skin daily. Remove & Discard patch within 12 hours or as directed by MD 10 patch 0  . Omega-3 Fatty Acids (FISH OIL) 1000 MG CAPS Take 1 capsule (1,000 mg total) by mouth daily. 90 capsule 3  . tiZANidine (ZANAFLEX) 2 MG tablet TAKE 1 TABLET EVERY 6 HOURS AS NEEDED FOR MUSCLE SPASM(S) 40 tablet 1   No current facility-administered medications on file prior to visit.   Review of Systems All otherwise neg per pt    Objective:   Physical Exam BP (!) 150/70 (BP Location: Left Arm, Patient Position: Sitting, Cuff Size: Large)   Pulse (!) 58   Temp 98 F (36.7 C) (Oral)   Ht 5\' 8"  (1.727 m)   Wt 160 lb (72.6 kg)   SpO2 98%   BMI 24.33 kg/m  VS noted,  Constitutional: Pt appears in NAD HENT: Head: NCAT.  Right Ear: External ear normal.  Left Ear: External ear normal.  Eyes: . Pupils are equal, round, and reactive to light. Conjunctivae and EOM are normal Nose: without d/c or deformity Neck: Neck supple. Gross normal ROM Cardiovascular: Normal rate and regular rhythm.   Pulmonary/Chest: Effort normal and breath sounds without rales or wheezing.  Abd:  Soft, NT, ND, + BS, no organomegaly Neurological:  Pt is alert. At baseline orientation, motor grossly intact Skin: Skin is warm. No rashes, other new lesions, no LE edema Psychiatric: Pt behavior is normal without agitation  All otherwise neg per pt Lab Results  Component Value Date   WBC 6.6 04/28/2019   HGB 13.5 04/28/2019   HCT 39.8 04/28/2019   PLT 224.0 04/28/2019   GLUCOSE 124 (H) 04/28/2019   CHOL 160 04/28/2019   TRIG 85.0 04/28/2019   HDL 41.20 04/28/2019   LDLCALC 102 (H) 04/28/2019   ALT 10 04/28/2019   AST 12 04/28/2019   NA 138 04/28/2019   K 3.5 04/28/2019   CL 101 04/28/2019   CREATININE 0.96 04/28/2019   BUN 21 04/28/2019   CO2 25 04/28/2019   TSH 2.08 04/28/2019     PSA 1.75 02/03/2007   INR 1.07 06/11/2017   HGBA1C 5.7 04/28/2019      Assessment & Plan:

## 2019-11-04 ENCOUNTER — Other Ambulatory Visit: Payer: Self-pay | Admitting: Internal Medicine

## 2019-11-04 DIAGNOSIS — M545 Low back pain, unspecified: Secondary | ICD-10-CM

## 2019-11-04 DIAGNOSIS — G894 Chronic pain syndrome: Secondary | ICD-10-CM

## 2019-11-04 NOTE — Telephone Encounter (Signed)
New Message:    Pt called and states he thought his HYDROcodone-acetaminophen (NORCO) 7.5-325 MG tablet was being called in yesterday after his appt on yesterday. Pt would like for [id number] - [place], [place] - [address] Please advise.

## 2019-11-05 NOTE — Telephone Encounter (Signed)
Patient following up on med refill being called in

## 2019-11-06 MED ORDER — HYDROCODONE-ACETAMINOPHEN 7.5-325 MG PO TABS: 1.0000 | ORAL_TABLET | Freq: Two times a day (BID) | ORAL | 0 refills | Status: DC | PRN

## 2019-11-06 NOTE — Telephone Encounter (Signed)
Sent to Dr. John. 

## 2019-11-06 NOTE — Telephone Encounter (Signed)
Called and left message for patient today that refill had been sent in.

## 2019-11-06 NOTE — Telephone Encounter (Signed)
Patient is calling to follow up on when his RX is going to be sent in.

## 2019-11-06 NOTE — Telephone Encounter (Signed)
Done erx 

## 2019-11-08 ENCOUNTER — Encounter: Payer: Self-pay | Admitting: Internal Medicine

## 2019-11-08 NOTE — Assessment & Plan Note (Signed)
stable overall by history and exam, recent data reviewed with pt, and pt to continue medical treatment as before,  to f/u any worsening symptoms or concerns  

## 2019-11-08 NOTE — Assessment & Plan Note (Addendum)

## 2019-12-03 ENCOUNTER — Other Ambulatory Visit: Payer: Self-pay

## 2019-12-03 ENCOUNTER — Ambulatory Visit (INDEPENDENT_AMBULATORY_CARE_PROVIDER_SITE_OTHER): Payer: Medicare Other

## 2019-12-03 DIAGNOSIS — Z23 Encounter for immunization: Secondary | ICD-10-CM

## 2019-12-04 ENCOUNTER — Telehealth: Payer: Self-pay | Admitting: Internal Medicine

## 2019-12-04 DIAGNOSIS — G894 Chronic pain syndrome: Secondary | ICD-10-CM

## 2019-12-04 DIAGNOSIS — M545 Low back pain, unspecified: Secondary | ICD-10-CM

## 2019-12-04 MED ORDER — HYDROCODONE-ACETAMINOPHEN 7.5-325 MG PO TABS: 1.0000 | ORAL_TABLET | Freq: Two times a day (BID) | ORAL | 0 refills | Status: DC | PRN

## 2019-12-04 NOTE — Telephone Encounter (Signed)
Patient called and is requesting a med refill for HYDROcodone-acetaminophen (NORCO) 7.5-325 MG tablet   [id number] - [place], [place] - [address]

## 2019-12-04 NOTE — Telephone Encounter (Signed)
Done erx 

## 2020-01-05 ENCOUNTER — Telehealth: Payer: Self-pay | Admitting: Internal Medicine

## 2020-01-05 DIAGNOSIS — G894 Chronic pain syndrome: Secondary | ICD-10-CM

## 2020-01-05 DIAGNOSIS — M545 Low back pain, unspecified: Secondary | ICD-10-CM

## 2020-01-05 MED ORDER — HYDROCODONE-ACETAMINOPHEN 7.5-325 MG PO TABS: 1.0000 | ORAL_TABLET | Freq: Two times a day (BID) | ORAL | 0 refills | Status: DC | PRN

## 2020-01-05 NOTE — Telephone Encounter (Signed)
HYDROcodone-acetaminophen (NORCO) 7.5-325 MG tablet [id number] [place],  - [address] Phone:  [phone]  Fax:  [phone]     Requesting a refill mar

## 2020-01-05 NOTE — Telephone Encounter (Signed)
Done erx 

## 2020-02-05 ENCOUNTER — Telehealth: Payer: Self-pay | Admitting: Internal Medicine

## 2020-02-05 DIAGNOSIS — M545 Low back pain, unspecified: Secondary | ICD-10-CM

## 2020-02-05 DIAGNOSIS — G894 Chronic pain syndrome: Secondary | ICD-10-CM

## 2020-02-05 MED ORDER — HYDROCODONE-ACETAMINOPHEN 7.5-325 MG PO TABS: 1.0000 | ORAL_TABLET | Freq: Two times a day (BID) | ORAL | 0 refills | Status: DC | PRN

## 2020-02-05 NOTE — Telephone Encounter (Signed)
Patient is requesting a med refill for HYDROcodone-acetaminophen (NORCO) 7.5-325 MG tablet  [id number] - [place], [place] - [address]

## 2020-03-08 ENCOUNTER — Telehealth: Payer: Self-pay | Admitting: Internal Medicine

## 2020-03-08 DIAGNOSIS — M545 Low back pain, unspecified: Secondary | ICD-10-CM

## 2020-03-08 DIAGNOSIS — G894 Chronic pain syndrome: Secondary | ICD-10-CM

## 2020-03-09 MED ORDER — HYDROCODONE-ACETAMINOPHEN 7.5-325 MG PO TABS: 1.0000 | ORAL_TABLET | Freq: Two times a day (BID) | ORAL | 0 refills | Status: DC | PRN

## 2020-03-09 NOTE — Telephone Encounter (Signed)
Sent to Dr. John. 

## 2020-03-09 NOTE — Telephone Encounter (Signed)
Done erx  [email or web address] reviewed  

## 2020-04-08 ENCOUNTER — Telehealth: Payer: Self-pay | Admitting: Internal Medicine

## 2020-04-08 DIAGNOSIS — G894 Chronic pain syndrome: Secondary | ICD-10-CM

## 2020-04-08 DIAGNOSIS — M545 Low back pain, unspecified: Secondary | ICD-10-CM

## 2020-04-08 NOTE — Telephone Encounter (Signed)
Last RF per controlled substance database: 03/09/20 Last OV: 11/03/19 Next OV: not scheduled.

## 2020-04-08 NOTE — Telephone Encounter (Signed)
1.Medication Requested: HYDROcodone-acetaminophen (NORCO) 7.5-325 MG tablet    2. Pharmacy (Name, Street, City): [id number] [place], [place] - [address]  3. On Med List: yes   4. Last Visit with PCP: 8.31.21  5. Next visit date with PCP: n/a     Agent: Please be advised that RX refills may take up to 3 business days. We ask that you follow-up with your pharmacy. 

## 2020-04-09 MED ORDER — HYDROCODONE-ACETAMINOPHEN 7.5-325 MG PO TABS: 1.0000 | ORAL_TABLET | Freq: Two times a day (BID) | ORAL | 0 refills | Status: DC | PRN

## 2020-04-09 NOTE — Addendum Note (Signed)
Addended by: Corwin Levins on: 04/09/2020 06:12 AM   Modules accepted: Orders

## 2020-05-05 ENCOUNTER — Telehealth: Payer: Self-pay | Admitting: Internal Medicine

## 2020-05-05 DIAGNOSIS — M545 Low back pain, unspecified: Secondary | ICD-10-CM

## 2020-05-05 DIAGNOSIS — G894 Chronic pain syndrome: Secondary | ICD-10-CM

## 2020-05-05 NOTE — Telephone Encounter (Signed)
1.Medication Requested: HYDROcodone-acetaminophen (NORCO) 7.5-325 MG tablet    2. Pharmacy (Name, Street, [phone] [place], [place] - [address]  3. On Med List: yes   4. Last Visit with PCP: 8.31.21  5. Next visit date with PCP: n/a     Agent: Please be advised that RX refills may take up to 3 business days. We ask that you follow-up with your pharmacy.

## 2020-05-06 MED ORDER — HYDROCODONE-ACETAMINOPHEN 7.5-325 MG PO TABS: 1.0000 | ORAL_TABLET | Freq: Two times a day (BID) | ORAL | 0 refills | Status: DC | PRN

## 2020-05-06 NOTE — Telephone Encounter (Signed)
[facility] 

## 2020-06-07 ENCOUNTER — Other Ambulatory Visit: Payer: Self-pay | Admitting: Internal Medicine

## 2020-06-07 DIAGNOSIS — M545 Low back pain, unspecified: Secondary | ICD-10-CM

## 2020-06-07 DIAGNOSIS — G894 Chronic pain syndrome: Secondary | ICD-10-CM

## 2020-06-07 MED ORDER — HYDROCODONE-ACETAMINOPHEN 7.5-325 MG PO TABS: 1.0000 | ORAL_TABLET | Freq: Two times a day (BID) | ORAL | 0 refills | Status: DC | PRN

## 2020-06-07 NOTE — Telephone Encounter (Signed)
Hydrocodone-Acetaminophen  Last Visit: 11/03/19 Next Visit: none Last Refill: 05/06/20 Please Advise; PMP done

## 2020-06-07 NOTE — Telephone Encounter (Signed)
    Patient requesting refill for HYDROcodone-acetaminophen (NORCO) 7.5-325 MG tablet  Pharmacy [id number] - [place], [place] - [address]

## 2020-06-07 NOTE — Telephone Encounter (Signed)
I refilled the hydrocodone  Please to contact pt - can I refer to pain management as I will no longer be able to prescribe this medication after September 02 2020 and it takes often 90 days for the referral to be accomplished

## 2020-06-09 NOTE — Telephone Encounter (Signed)
Spoke with patient's wife Ollen Gross on [place]). Advised of message regarding pain medication management. Patient's wife verbalizes understanding. Per patient's wife, would like patient to be seen due to having increased weakness and sleeping a lot. Patient added to scheduled 06/10/20 at 9:20. Closing encounter.

## 2020-06-10 ENCOUNTER — Encounter: Payer: Self-pay | Admitting: Internal Medicine

## 2020-06-10 ENCOUNTER — Ambulatory Visit (INDEPENDENT_AMBULATORY_CARE_PROVIDER_SITE_OTHER): Payer: Medicare Other | Admitting: Internal Medicine

## 2020-06-10 ENCOUNTER — Other Ambulatory Visit: Payer: Self-pay

## 2020-06-10 VITALS — BP 130/76 | HR 76 | Temp 98.2°F | Ht 68.0 in | Wt 162.2 lb

## 2020-06-10 DIAGNOSIS — E538 Deficiency of other specified B group vitamins: Secondary | ICD-10-CM

## 2020-06-10 DIAGNOSIS — G894 Chronic pain syndrome: Secondary | ICD-10-CM | POA: Diagnosis not present

## 2020-06-10 DIAGNOSIS — M545 Low back pain, unspecified: Secondary | ICD-10-CM

## 2020-06-10 DIAGNOSIS — E559 Vitamin D deficiency, unspecified: Secondary | ICD-10-CM | POA: Diagnosis not present

## 2020-06-10 DIAGNOSIS — I10 Essential (primary) hypertension: Secondary | ICD-10-CM | POA: Diagnosis not present

## 2020-06-10 DIAGNOSIS — R7302 Impaired glucose tolerance (oral): Secondary | ICD-10-CM | POA: Diagnosis not present

## 2020-06-10 DIAGNOSIS — I350 Nonrheumatic aortic (valve) stenosis: Secondary | ICD-10-CM

## 2020-06-10 LAB — HEMOGLOBIN A1C: Hgb A1c MFr Bld: 6.2 % (ref 4.6–6.5)

## 2020-06-10 LAB — CBC WITH DIFFERENTIAL/PLATELET
Basophils Absolute: 0.1 10*3/uL (ref 0.0–0.1)
Basophils Relative: 1.1 % (ref 0.0–3.0)
Eosinophils Absolute: 0.1 10*3/uL (ref 0.0–0.7)
Eosinophils Relative: 1.2 % (ref 0.0–5.0)
HCT: 38.8 % — ABNORMAL LOW (ref 39.0–52.0)
Hemoglobin: 13.3 g/dL (ref 13.0–17.0)
Lymphocytes Relative: 8.9 % — ABNORMAL LOW (ref 12.0–46.0)
Lymphs Abs: 0.9 10*3/uL (ref 0.7–4.0)
MCHC: 34.2 g/dL (ref 30.0–36.0)
MCV: 90.5 fl (ref 78.0–100.0)
Monocytes Absolute: 0.7 10*3/uL (ref 0.1–1.0)
Monocytes Relative: 7.8 % (ref 3.0–12.0)
Neutro Abs: 7.7 10*3/uL (ref 1.4–7.7)
Neutrophils Relative %: 81 % — ABNORMAL HIGH (ref 43.0–77.0)
Platelets: 236 10*3/uL (ref 150.0–400.0)
RBC: 4.28 Mil/uL (ref 4.22–5.81)
RDW: 14 % (ref 11.5–15.5)
WBC: 9.6 10*3/uL (ref 4.0–10.5)

## 2020-06-10 LAB — BASIC METABOLIC PANEL
BUN: 17 mg/dL (ref 6–23)
CO2: 29 mEq/L (ref 19–32)
Calcium: 9.4 mg/dL (ref 8.4–10.5)
Chloride: 102 mEq/L (ref 96–112)
Creatinine, Ser: 0.76 mg/dL (ref 0.40–1.50)
GFR: 77.86 mL/min (ref >60.00)
Glucose, Bld: 113 mg/dL — ABNORMAL HIGH (ref 70–99)
Potassium: 3.7 mEq/L (ref 3.5–5.1)
Sodium: 140 mEq/L (ref 135–145)

## 2020-06-10 LAB — URINALYSIS, ROUTINE W REFLEX MICROSCOPIC
Bilirubin Urine: NEGATIVE
Hgb urine dipstick: NEGATIVE
Ketones, ur: NEGATIVE
Leukocytes,Ua: NEGATIVE
Nitrite: NEGATIVE
Specific Gravity, Urine: 1.015 (ref 1.000–1.030)
Total Protein, Urine: 30 — AB
Urine Glucose: NEGATIVE
Urobilinogen, UA: 1 (ref 0.0–1.0)
pH: 7.5 (ref 5.0–8.0)

## 2020-06-10 LAB — VITAMIN D 25 HYDROXY (VIT D DEFICIENCY, FRACTURES): VITD: 28.22 ng/mL — ABNORMAL LOW (ref 30.00–100.00)

## 2020-06-10 LAB — HEPATIC FUNCTION PANEL
ALT: 9 U/L (ref 0–53)
AST: 10 U/L (ref 0–37)
Albumin: 4.2 g/dL (ref 3.5–5.2)
Alkaline Phosphatase: 81 U/L (ref 39–117)
Bilirubin, Direct: 0.1 mg/dL (ref 0.0–0.3)
Total Bilirubin: 0.8 mg/dL (ref 0.2–1.2)
Total Protein: 7 g/dL (ref 6.0–8.3)

## 2020-06-10 LAB — TSH: TSH: 3.75 u[IU]/mL (ref 0.35–4.50)

## 2020-06-10 LAB — LIPID PANEL
Cholesterol: 139 mg/dL (ref 0–200)
HDL: 37.5 mg/dL — ABNORMAL LOW (ref >39.00)
LDL Cholesterol: 86 mg/dL (ref 0–99)
NonHDL: 101.21
Total CHOL/HDL Ratio: 4
Triglycerides: 78 mg/dL (ref 0.0–149.0)
VLDL: 15.6 mg/dL (ref 0.0–40.0)

## 2020-06-10 LAB — VITAMIN B12: Vitamin B-12: 623 pg/mL (ref 211–911)

## 2020-06-10 MED ORDER — HYDROCODONE-ACETAMINOPHEN 7.5-325 MG PO TABS: 1.0000 | ORAL_TABLET | Freq: Two times a day (BID) | ORAL | 0 refills | Status: DC | PRN

## 2020-06-10 NOTE — Progress Notes (Signed)
Patient ID: [name], male   DOB: February 18, 1926, 87 y.o.   MRN: [id number]         Chief Complaint:: yearly Office Visit (Patient's son c/o having weakness, not drinking and eating enough, forgetfulness, sleeping more than usual)         HPI:  Christopher Tran is a 87 y.o. male here with son for above; declines covid booster o/w up to date with preventive referrals and immunizations;          Son mentions several symptoms, all mild, all of which pt denies; not taking vit d,  Pt denies polydipsia, polyuria, Pt denies chest pain, increased sob or doe, wheezing, orthopnea, PND, increased LE swelling, palpitations, dizziness or syncope,  Has been off fluid pill recently for unclear reasons.  Denies new worsening focal neuro s/s   Pt denies fever, wt loss, night sweats, loss of appetite, or other constitutional symptoms  Denies worsening depressive symptoms, suicidal ideation, or panic  Pt denies other new complaints  Overall chronic pain persists, asks for refill prn vicodin  Wt Readings from Last 3 Encounters:  06/10/20 162 lb 3.2 oz (73.6 kg)  11/03/19 160 lb (72.6 kg)  04/28/19 165 lb (74.8 kg)   BP Readings from Last 3 Encounters:  06/10/20 130/76  11/03/19 (!) 150/70  04/28/19 (!) 146/82   Immunization History  Administered Date(s) Administered  . Fluad Quad(high Dose 65+) 11/25/2018, 12/03/2019  . H1N1 03/16/2008  . Influenza Split 11/21/2010, 12/05/2011  . Influenza Whole 12/01/2008, 11/10/2009  . Influenza, High Dose Seasonal PF 12/11/2012, 12/07/2013, 12/24/2014, 11/10/2015, 10/24/2016, 01/07/2018  . PFIZER(Purple Top)SARS-COV-2 Vaccination 03/27/2019, 04/25/2019  . Pneumococcal Conjugate-13 09/30/2013  . Tdap 04/28/2019   There are no preventive care reminders to display for this patient.    Past Medical History:  Diagnosis Date  . Aortic stenosis   . DEGENERATIVE JOINT DISEASE, LUMBAR SPINE 01/06/2007   Qualifier: Diagnosis of  By: Dance CMA (AAMA), Kim    .  FASCIITIS, PLANTAR 01/06/2007   Qualifier: Diagnosis of  By: Dance CMA (AAMA), Kim    . GERD (gastroesophageal reflux disease) 10/02/2013  . HYPERTENSION 01/06/2007   Qualifier: Diagnosis of  By: Dance CMA (AAMA), Kim    . Other abnormal blood chemistry 02/03/2007   Qualifier: Diagnosis of  By: Debby Bud MD, Rosalyn Gess    Past Surgical History:  Procedure Laterality Date  . BLEPHAROPLASTY  2006   Bilateral  . CATARACT EXTRACTION  2006   bilateral  . plantar fascilitis    . ROTATOR CUFF REPAIR  2007   right  . TONSILLECTOMY      reports that he has quit smoking. He has never used smokeless tobacco. He reports current alcohol use. He reports that he does not use drugs. family history includes Other in his brother, father, mother, and sister. No Known Allergies Current Outpatient Medications on File Prior to Visit  Medication Sig Dispense Refill  . amLODipine (NORVASC) 5 MG tablet Take 1 tablet (5 mg total) by mouth daily. 90 tablet 3  . cholecalciferol (VITAMIN D) 1000 UNITS tablet Take 1,000 Units by mouth daily.    . Omega-3 Fatty Acids (FISH OIL) 1000 MG CAPS Take 1 capsule (1,000 mg total) by mouth daily. 90 capsule 3  . guaiFENesin (MUCINEX) 600 MG 12 hr tablet Take 1 tablet (600 mg total) by mouth 2 (two) times daily as needed for to loosen phlegm. (Patient not taking: Reported on 06/10/2020) 10 tablet 0  . lidocaine (LIDODERM)  5 % Place 1 patch onto the skin daily. Remove & Discard patch within 12 hours or as directed by MD (Patient not taking: Reported on 06/10/2020) 10 patch 0  . pantoprazole (PROTONIX) 40 MG tablet Take 1 tablet (40 mg total) by mouth daily. (Patient not taking: Reported on 06/10/2020) 90 tablet 3  . tiZANidine (ZANAFLEX) 2 MG tablet TAKE 1 TABLET EVERY 6 HOURS AS NEEDED FOR MUSCLE SPASM(S) (Patient not taking: Reported on 06/10/2020) 40 tablet 1   No current facility-administered medications on file prior to visit.        ROS:  All others reviewed and  negative.  Objective        PE:  BP 130/76 (BP Location: Right Arm, Patient Position: Sitting, Cuff Size: Large)   Pulse 76   Temp 98.2 F (36.8 C) (Oral)   Ht 5\' 8"  (1.727 m)   Wt 162 lb 3.2 oz (73.6 kg)   SpO2 97%   BMI 24.66 kg/m                 Constitutional: Pt appears in NAD               HENT: Head: NCAT.                Right Ear: External ear normal.                 Left Ear: External ear normal.                Eyes: . Pupils are equal, round, and reactive to light. Conjunctivae and EOM are normal               Nose: without d/c or deformity               Neck: Neck supple. Gross normal ROM               Cardiovascular: Normal rate and regular rhythm.  With gr 2/6 sys murmur rusb               Pulmonary/Chest: Effort normal and breath sounds without rales or wheezing.                Abd:  Soft, NT, ND, + BS, no organomegaly               Neurological: Pt is alert. At baseline orientation, motor grossly intact               Skin: Skin is warm. No rashes, no other new lesions, LE edema - trace bilat               Psychiatric: Pt behavior is normal without agitation   Micro: none  Cardiac tracings I have personally interpreted today:  none  Pertinent Radiological findings (summarize): none   Lab Results  Component Value Date   WBC 9.6 06/10/2020   HGB 13.3 06/10/2020   HCT 38.8 (L) 06/10/2020   PLT 236.0 06/10/2020   GLUCOSE 113 (H) 06/10/2020   CHOL 139 06/10/2020   TRIG 78.0 06/10/2020   HDL 37.50 (L) 06/10/2020   LDLCALC 86 06/10/2020   ALT 9 06/10/2020   AST 10 06/10/2020   NA 140 06/10/2020   K 3.7 06/10/2020   CL 102 06/10/2020   CREATININE 0.76 06/10/2020   BUN 17 06/10/2020   CO2 29 06/10/2020   TSH 3.75 06/10/2020   PSA 1.75 02/03/2007   INR 1.07 06/11/2017   HGBA1C 6.2  06/10/2020   Assessment/Plan:  Christopher Tran is a 87 y.o. White or Caucasian [1] male with  has a past medical history of Aortic stenosis, DEGENERATIVE JOINT DISEASE,  LUMBAR SPINE (01/06/2007), FASCIITIS, PLANTAR (01/06/2007), GERD (gastroesophageal reflux disease) (10/02/2013), HYPERTENSION (01/06/2007), and Other abnormal blood chemistry (02/03/2007).  Moderate aortic stenosis Little to no symptoms but I suspect may become symptomatic at some point relatively soon, will refer cardiology  Chronic pain syndrome Stable, for pain med refill,  to f/u any worsening symptoms or concerns  Impaired glucose tolerance Lab Results  Component Value Date   HGBA1C 6.2 06/10/2020   Stable, pt to continue current medical treatment  - diet   Essential hypertension, benign BP Readings from Last 3 Encounters:  06/10/20 130/76  11/03/19 (!) 150/70  04/28/19 (!) 146/82   Stable, pt to continue medical treatment  - norvasc only, ok to remain off diuretic   Low back pain O/w stable without worsening neuro s/s,  to f/u any worsening symptoms or concerns  Vitamin D deficiency Last vitamin D Lab Results  Component Value Date   VD25OH 28.22 (L) 06/10/2020   Low, to start oral replacement   Followup: Return in about 6 months (around 12/10/2020).  Oliver Barre, MD 06/11/2020 5:43 AM [facility]

## 2020-06-10 NOTE — Patient Instructions (Signed)
Ok to stay off the fluid pill for now to avoid dehydration  Please continue all other medications as before, and refills have been done if requested - the pain medication  Please have the pharmacy call with any other refills you may need.  Please continue your efforts at being more active, low cholesterol diet, and weight control.  You are otherwise up to date with prevention measures today.  Please keep your appointments with your specialists as you may have planned  You will be contacted regarding the referral for: Cardiology  Please go to the LAB at the blood drawing area for the tests to be done  You will be contacted by phone if any changes need to be made immediately.  Otherwise, you will receive a letter about your results with an explanation, but please check with MyChart first.  Please remember to sign up for MyChart if you have not done so, as this will be important to you in the future with finding out test results, communicating by private email, and scheduling acute appointments online when needed.  Please make an Appointment to return in 6 months, or sooner if needed

## 2020-06-11 ENCOUNTER — Encounter: Payer: Self-pay | Admitting: Internal Medicine

## 2020-06-11 DIAGNOSIS — E559 Vitamin D deficiency, unspecified: Secondary | ICD-10-CM | POA: Insufficient documentation

## 2020-06-11 NOTE — Assessment & Plan Note (Addendum)
BP Readings from Last 3 Encounters:  06/10/20 130/76  11/03/19 (!) 150/70  04/28/19 (!) 146/82   Stable, pt to continue medical treatment  - norvasc only, ok to remain off diuretic

## 2020-06-11 NOTE — Assessment & Plan Note (Signed)
Stable, for pain med refill,  to f/u any worsening symptoms or concerns  

## 2020-06-11 NOTE — Assessment & Plan Note (Signed)
Lab Results  Component Value Date   HGBA1C 6.2 06/10/2020   Stable, pt to continue current medical treatment  - diet

## 2020-06-11 NOTE — Assessment & Plan Note (Signed)
Little to no symptoms but I suspect may become symptomatic at some point relatively soon, will refer cardiology

## 2020-06-11 NOTE — Assessment & Plan Note (Signed)
O/w stable without worsening neuro s/s,  to f/u any worsening symptoms or concerns

## 2020-06-11 NOTE — Assessment & Plan Note (Signed)
Last vitamin D Lab Results  Component Value Date   VD25OH 28.22 (L) 06/10/2020   Low, to start oral replacement

## 2020-07-05 ENCOUNTER — Telehealth: Payer: Self-pay | Admitting: Internal Medicine

## 2020-07-05 DIAGNOSIS — M545 Low back pain, unspecified: Secondary | ICD-10-CM

## 2020-07-05 DIAGNOSIS — G894 Chronic pain syndrome: Secondary | ICD-10-CM

## 2020-07-05 NOTE — Telephone Encounter (Signed)
1.Medication Requested: HYDROcodone-acetaminophen (NORCO) 7.5-325 MG tablet    2. Pharmacy (Name, Street, [phone] [place], [place] - [address]  3. On Med List: yes   4. Last Visit with PCP: 06-10-20  5. Next visit date with PCP: 12-12-20   Agent: Please be advised that RX refills may take up to 3 business days. We ask that you follow-up with your pharmacy.

## 2020-07-06 MED ORDER — HYDROCODONE-ACETAMINOPHEN 7.5-325 MG PO TABS: 1.0000 | ORAL_TABLET | Freq: Two times a day (BID) | ORAL | 0 refills | Status: DC | PRN

## 2020-07-06 NOTE — Telephone Encounter (Signed)
PMP done; last filled 06/07/20 

## 2020-07-06 NOTE — Telephone Encounter (Signed)
Done erx 

## 2020-07-12 ENCOUNTER — Other Ambulatory Visit: Payer: Self-pay | Admitting: Internal Medicine

## 2020-08-08 ENCOUNTER — Telehealth: Payer: Self-pay | Admitting: Internal Medicine

## 2020-08-08 DIAGNOSIS — G894 Chronic pain syndrome: Secondary | ICD-10-CM

## 2020-08-08 DIAGNOSIS — M545 Low back pain, unspecified: Secondary | ICD-10-CM

## 2020-08-08 MED ORDER — HYDROCODONE-ACETAMINOPHEN 7.5-325 MG PO TABS: 1.0000 | ORAL_TABLET | Freq: Two times a day (BID) | ORAL | 0 refills | Status: DC | PRN

## 2020-08-08 NOTE — Telephone Encounter (Signed)
1.Medication Requested: HYDROcodone-acetaminophen (NORCO) 7.5-325 MG tablet    2. Pharmacy (Name, Street, [phone] [place], [place] - [address]  3. On Med List: yes   4. Last Visit with PCP: 06-10-20  5. Next visit date with PCP: 12-12-20   Agent: Please be advised that RX refills may take up to 3 business days. We ask that you follow-up with your pharmacy.

## 2020-08-08 NOTE — Telephone Encounter (Signed)
Done erx 

## 2020-08-08 NOTE — Telephone Encounter (Signed)
PMP done; last filled 07/06/20

## 2020-08-18 NOTE — Progress Notes (Signed)
HPI: Follow-up aortic stenosis. Echocardiogram January 2021 showed normal LV function, grade 1 diastolic dysfunction, mild aortic insufficiency, mild to moderate aortic stenosis with mean gradient 16 mmHg.  Since last seen patient denies dyspnea, chest pain, palpitations or syncope.  Current Outpatient Medications  Medication Sig Dispense Refill   amLODipine (NORVASC) 5 MG tablet Take 1 tablet (5 mg total) by mouth daily. 90 tablet 3   cholecalciferol (VITAMIN D) 1000 UNITS tablet Take 1,000 Units by mouth daily.     HYDROcodone-acetaminophen (NORCO) 7.5-325 MG tablet Take 1 tablet by mouth 2 (two) times daily as needed for moderate pain or severe pain (chronic pain). 60 tablet 0   Omega-3 Fatty Acids (FISH OIL) 1000 MG CAPS Take 1 capsule (1,000 mg total) by mouth daily. 90 capsule 3   No current facility-administered medications for this visit.     Past Medical History:  Diagnosis Date   Aortic stenosis    DEGENERATIVE JOINT DISEASE, LUMBAR SPINE 01/06/2007   Qualifier: Diagnosis of  By: Dance CMA (AAMA), Orvan July, PLANTAR 01/06/2007   Qualifier: Diagnosis of  By: Dance CMA (AAMA), Kim     GERD (gastroesophageal reflux disease) 10/02/2013   HYPERTENSION 01/06/2007   Qualifier: Diagnosis of  By: Dance CMA (AAMA), Kim     Other abnormal blood chemistry 02/03/2007   Qualifier: Diagnosis of  By: Debby Bud MD, Rosalyn Gess     Past Surgical History:  Procedure Laterality Date   BLEPHAROPLASTY  2006   Bilateral   CATARACT EXTRACTION  2006   bilateral   plantar fascilitis     ROTATOR CUFF REPAIR  2007   right   TONSILLECTOMY      Social History   Socioeconomic History   Marital status: Married    Spouse name: Not on file   Number of children: 1   Years of education: 12   Highest education level: Not on file  Occupational History   Occupation: [employer]    Comment: retired  Tobacco Use   Smoking status: Former    Pack years: 0.00   Smokeless tobacco: Never    Tobacco comments:    >50 years ago.  Substance and Sexual Activity   Alcohol use: Yes    Comment: occ beer   Drug use: No   Sexual activity: Not Currently  Other Topics Concern   Not on file  Social History Narrative   Married '57-she had nephrectomy for RCC-robotic surgery. 1 son - 2. 2 grandchildren. 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 Keeps Busy. ACP/End of Life Care: No heroic measures of life support; i.e. DNR, no mechanical ventilation.    Social Determinants of Health   Financial Resource Strain: Not on file  Food Insecurity: Not on file  Transportation Needs: Not on file  Physical Activity: Not on file  Stress: Not on file  Social Connections: Not on file  Intimate Partner Violence: Not on file    Family History  Problem Relation Age of Onset   Other Father        Killed in [employer] [place]   Other Mother        Diphtheria   Other Brother        Killed in MVA 06/26/2022   Other Sister        Died at [employer]   Prostate cancer Neg Hx    Colon cancer Neg Hx     ROS: no fevers or chills, productive cough, hemoptysis, dysphasia, odynophagia,  melena, hematochezia, dysuria, hematuria, rash, seizure activity, orthopnea, PND, pedal edema, claudication. Remaining systems are negative.  Physical Exam: Well-developed well-nourished in no acute distress.  Skin is warm and dry.  HEENT is normal.  Neck is supple.  Chest is clear to auscultation with normal expansion.  Cardiovascular exam is regular rate and rhythm.  3/6 systolic murmur left sternal border.  S2 is not diminished. Abdominal exam nontender or distended. No masses palpated. Extremities show no edema. neuro grossly intact  ECG- Sinus bradycardia with pacs; LVH; cannot rule out inferior infarct.  Personally reviewed  A/P  1 aortic stenosis-patient continues to do well with no symptoms of chest pain, dyspnea or syncope.  We will repeat echocardiogram to reassess severity of aortic stenosis.  We discussed potential TAVR in  the future if necessary.  He would be hesitant.  If he decides that he would never consider this in the future then we will stop performing follow-up echocardiograms as they would not change our management.  2 hypertension-patient's blood pressure is controlled.  Continue present medical regimen.  Olga Millers, MD

## 2020-08-30 ENCOUNTER — Encounter: Payer: Self-pay | Admitting: Cardiology

## 2020-08-30 ENCOUNTER — Other Ambulatory Visit: Payer: Self-pay

## 2020-08-30 ENCOUNTER — Ambulatory Visit (INDEPENDENT_AMBULATORY_CARE_PROVIDER_SITE_OTHER): Payer: Medicare Other | Admitting: Cardiology

## 2020-08-30 VITALS — BP 138/70 | HR 58 | Ht 67.0 in | Wt 160.0 lb

## 2020-08-30 DIAGNOSIS — I10 Essential (primary) hypertension: Secondary | ICD-10-CM

## 2020-08-30 DIAGNOSIS — I350 Nonrheumatic aortic (valve) stenosis: Secondary | ICD-10-CM

## 2020-08-30 NOTE — Patient Instructions (Signed)

## 2020-09-06 ENCOUNTER — Telehealth: Payer: Self-pay | Admitting: Internal Medicine

## 2020-09-06 DIAGNOSIS — M545 Low back pain, unspecified: Secondary | ICD-10-CM

## 2020-09-06 DIAGNOSIS — G894 Chronic pain syndrome: Secondary | ICD-10-CM

## 2020-09-06 NOTE — Telephone Encounter (Signed)
1.Medication Requested: HYDROcodone-acetaminophen (NORCO) 7.5-325 MG tablet    2. Pharmacy (Name, Street, City): [id number] [place], [place] - [address]  3. On Med List: yes   4. Last Visit with PCP: 06-10-20  5. Next visit date with PCP: 12-12-20   Agent: Please be advised that RX refills may take up to 3 business days. We ask that you follow-up with your pharmacy.  

## 2020-09-09 MED ORDER — HYDROCODONE-ACETAMINOPHEN 7.5-325 MG PO TABS: 1.0000 | ORAL_TABLET | Freq: Two times a day (BID) | ORAL | 0 refills | Status: DC | PRN

## 2020-09-09 NOTE — Telephone Encounter (Signed)
Patient called again

## 2020-09-09 NOTE — Telephone Encounter (Signed)
PMP done; last filled 08/08/20

## 2020-09-09 NOTE — Telephone Encounter (Signed)
This is the first I have seen this reqst  Done erx

## 2020-09-09 NOTE — Telephone Encounter (Signed)
Left message on patient's answering machine to let him know rx is at pharmacy

## 2020-09-20 ENCOUNTER — Ambulatory Visit (HOSPITAL_COMMUNITY): Payer: Medicare Other | Attending: Cardiology

## 2020-09-20 ENCOUNTER — Other Ambulatory Visit: Payer: Self-pay

## 2020-09-20 DIAGNOSIS — I350 Nonrheumatic aortic (valve) stenosis: Secondary | ICD-10-CM | POA: Diagnosis not present

## 2020-09-20 LAB — ECHOCARDIOGRAM COMPLETE
AR max vel: 1.16 cm2
AV Area VTI: 1 cm2
AV Area mean vel: 1.03 cm2
AV Mean grad: 26.6 mmHg
AV Peak grad: 49.9 mmHg
Ao pk vel: 3.53 m/s
Area-P 1/2: 2.69 cm2
MV VTI: 1.6 cm2
P 1/2 time: 640 msec
S' Lateral: 3 cm

## 2020-10-06 ENCOUNTER — Telehealth: Payer: Self-pay | Admitting: Internal Medicine

## 2020-10-06 DIAGNOSIS — G894 Chronic pain syndrome: Secondary | ICD-10-CM

## 2020-10-06 DIAGNOSIS — M545 Low back pain, unspecified: Secondary | ICD-10-CM

## 2020-10-06 NOTE — Telephone Encounter (Signed)
1.Medication Requested: HYDROcodone-acetaminophen (NORCO) 7.5-325 MG tablet  2. Pharmacy (Name, Street, [place]): [phone] [place], [place] - [address]  Phone:  [phone] Fax:  [phone]   3. On Med List: yes  4. Last Visit with PCP: 04.08.22  5. Next visit date with PCP: 10.10.22   Agent: Please be advised that RX refills may take up to 3 business days. We ask that you follow-up with your pharmacy.

## 2020-10-07 MED ORDER — HYDROCODONE-ACETAMINOPHEN 7.5-325 MG PO TABS: 1.0000 | ORAL_TABLET | Freq: Two times a day (BID) | ORAL | 0 refills | Status: DC | PRN

## 2020-10-07 NOTE — Telephone Encounter (Signed)
Hydrocodone  Last Visit: 06/10/20 Next Visit: 12/12/20 Last Refill: 09/09/20  PMP done; please advise

## 2020-11-08 ENCOUNTER — Telehealth: Payer: Self-pay | Admitting: Internal Medicine

## 2020-11-08 DIAGNOSIS — G894 Chronic pain syndrome: Secondary | ICD-10-CM

## 2020-11-08 DIAGNOSIS — M545 Low back pain, unspecified: Secondary | ICD-10-CM

## 2020-11-08 MED ORDER — HYDROCODONE-ACETAMINOPHEN 7.5-325 MG PO TABS: 1.0000 | ORAL_TABLET | Freq: Two times a day (BID) | ORAL | 0 refills | Status: DC | PRN

## 2020-11-08 NOTE — Telephone Encounter (Signed)
1.Medication Requested: HYDROcodone-acetaminophen (NORCO) 7.5-325 MG tablet 2. Pharmacy (Name, Street, [place]): [phone] [place] - [address] 3. On Med List: y  4. Last Visit with PCP: 06/10/2020   5. Next visit date with PCP: 12/12/2020   Agent: Please be advised that RX refills may take up to 3 business days. We ask that you follow-up with your pharmacy.

## 2020-12-06 ENCOUNTER — Telehealth: Payer: Self-pay | Admitting: Internal Medicine

## 2020-12-06 DIAGNOSIS — G894 Chronic pain syndrome: Secondary | ICD-10-CM

## 2020-12-06 DIAGNOSIS — M545 Low back pain, unspecified: Secondary | ICD-10-CM

## 2020-12-06 MED ORDER — HYDROCODONE-ACETAMINOPHEN 7.5-325 MG PO TABS: 1.0000 | ORAL_TABLET | Freq: Two times a day (BID) | ORAL | 0 refills | Status: DC | PRN

## 2020-12-06 NOTE — Telephone Encounter (Signed)
1.Medication Requested: HYDROcodone-acetaminophen (NORCO) 7.5-325 MG tablet  2. Pharmacy (Name, Street, City): [id number] [place], [place] - [address]  Phone:  [phone] Fax:  [phone]   3. On Med List: yes  4. Last Visit with PCP: 04.08.22  5. Next visit date with PCP: 10.10.22   Agent: Please be advised that RX refills may take up to 3 business days. We ask that you follow-up with your pharmacy.  

## 2020-12-12 ENCOUNTER — Encounter: Payer: Self-pay | Admitting: Internal Medicine

## 2020-12-12 ENCOUNTER — Other Ambulatory Visit: Payer: Self-pay | Admitting: Internal Medicine

## 2020-12-12 ENCOUNTER — Ambulatory Visit (INDEPENDENT_AMBULATORY_CARE_PROVIDER_SITE_OTHER): Payer: Medicare Other | Admitting: Internal Medicine

## 2020-12-12 ENCOUNTER — Other Ambulatory Visit: Payer: Self-pay

## 2020-12-12 VITALS — BP 126/68 | HR 52 | Temp 98.1°F | Resp 99 | Ht 67.0 in | Wt 160.0 lb

## 2020-12-12 DIAGNOSIS — Z23 Encounter for immunization: Secondary | ICD-10-CM | POA: Diagnosis not present

## 2020-12-12 DIAGNOSIS — I10 Essential (primary) hypertension: Secondary | ICD-10-CM | POA: Diagnosis not present

## 2020-12-12 DIAGNOSIS — R7302 Impaired glucose tolerance (oral): Secondary | ICD-10-CM

## 2020-12-12 DIAGNOSIS — K219 Gastro-esophageal reflux disease without esophagitis: Secondary | ICD-10-CM | POA: Diagnosis not present

## 2020-12-12 DIAGNOSIS — E559 Vitamin D deficiency, unspecified: Secondary | ICD-10-CM

## 2020-12-12 NOTE — Progress Notes (Signed)
Patient ID: Christopher Tran, male   DOB: 08-02-1925, 87 y.o.   MRN: [id number]        Chief Complaint: follow up HTN, HLD and hyperglycemia, gerd, low vit d       HPI:  Christopher Tran is a 87 y.o. male here overall doing ok.  Pt denies chest pain, increased sob or doe, wheezing, orthopnea, PND, increased LE swelling, palpitations, dizziness or syncope.   Pt denies polydipsia, polyuria, or new focal neuro s/s.   Pt denies fever, wt loss, night sweats, loss of appetite, or other constitutional symptoms   Not taking Vit d.  Denies worsening reflux, abd pain, dysphagia, n/v, bowel change or blood.        Wt Readings from Last 3 Encounters:  12/12/20 160 lb (72.6 kg)  08/30/20 160 lb (72.6 kg)  06/10/20 162 lb 3.2 oz (73.6 kg)   BP Readings from Last 3 Encounters:  12/12/20 126/68  08/30/20 138/70  06/10/20 130/76         Past Medical History:  Diagnosis Date   Aortic stenosis    DEGENERATIVE JOINT DISEASE, LUMBAR SPINE 01/06/2007   Qualifier: Diagnosis of  By: Dance CMA (AAMA), Orvan July, PLANTAR 01/06/2007   Qualifier: Diagnosis of  By: Dance CMA (AAMA), Kim     GERD (gastroesophageal reflux disease) 10/02/2013   HYPERTENSION 01/06/2007   Qualifier: Diagnosis of  By: Dance CMA (AAMA), Kim     Other abnormal blood chemistry 02/03/2007   Qualifier: Diagnosis of  By: Debby Bud MD, Rosalyn Gess    Past Surgical History:  Procedure Laterality Date   BLEPHAROPLASTY  2006   Bilateral   CATARACT EXTRACTION  2006   bilateral   plantar fascilitis     ROTATOR CUFF REPAIR  2007   right   TONSILLECTOMY      reports that he has quit smoking. He has never used smokeless tobacco. He reports current alcohol use. He reports that he does not use drugs. family history includes Other in his brother, father, mother, and sister. No Known Allergies Current Outpatient Medications on File Prior to Visit  Medication Sig Dispense Refill   amLODipine (NORVASC) 5 MG tablet Take 1 tablet (5 mg total)  by mouth daily. 90 tablet 3   cholecalciferol (VITAMIN D) 1000 UNITS tablet Take 1,000 Units by mouth daily.     HYDROcodone-acetaminophen (NORCO) 7.5-325 MG tablet Take 1 tablet by mouth 2 (two) times daily as needed for moderate pain or severe pain (chronic pain). 60 tablet 0   Omega-3 Fatty Acids (FISH OIL) 1000 MG CAPS Take 1 capsule (1,000 mg total) by mouth daily. 90 capsule 3   No current facility-administered medications on file prior to visit.        ROS:  All others reviewed and negative.  Objective        PE:  BP 126/68 (BP Location: Right Arm, Patient Position: Sitting, Cuff Size: Normal)   Pulse (!) 52   Temp 98.1 F (36.7 C) (Oral)   Resp (!) 99   Ht 5\' 7"  (1.702 m)   Wt 160 lb (72.6 kg)   BMI 25.06 kg/m                 Constitutional: Pt appears in NAD               HENT: Head: NCAT.                Right Ear: External  ear normal.                 Left Ear: External ear normal.                Eyes: . Pupils are equal, round, and reactive to light. Conjunctivae and EOM are normal               Nose: without d/c or deformity               Neck: Neck supple. Gross normal ROM               Cardiovascular: Normal rate and regular rhythm.                 Pulmonary/Chest: Effort normal and breath sounds without rales or wheezing.                Abd:  Soft, NT, ND, + BS, no organomegaly               Neurological: Pt is alert. At baseline orientation, motor grossly intact               Skin: Skin is warm. No rashes, no other new lesions, LE edema - none               Psychiatric: Pt behavior is normal without agitation   Micro: none  Cardiac tracings I have personally interpreted today:  none  Pertinent Radiological findings (summarize): none   Lab Results  Component Value Date   WBC 9.6 06/10/2020   HGB 13.3 06/10/2020   HCT 38.8 (L) 06/10/2020   PLT 236.0 06/10/2020   GLUCOSE 113 (H) 06/10/2020   CHOL 139 06/10/2020   TRIG 78.0 06/10/2020   HDL 37.50 (L)  06/10/2020   LDLCALC 86 06/10/2020   ALT 9 06/10/2020   AST 10 06/10/2020   NA 140 06/10/2020   K 3.7 06/10/2020   CL 102 06/10/2020   CREATININE 0.76 06/10/2020   BUN 17 06/10/2020   CO2 29 06/10/2020   TSH 3.75 06/10/2020   PSA 1.75 02/03/2007   INR 1.07 06/11/2017   HGBA1C 6.2 06/10/2020   Assessment/Plan:  Christopher Tran is a 87 y.o. White or Caucasian [1] male with  has a past medical history of Aortic stenosis, DEGENERATIVE JOINT DISEASE, LUMBAR SPINE (01/06/2007), FASCIITIS, PLANTAR (01/06/2007), GERD (gastroesophageal reflux disease) (10/02/2013), HYPERTENSION (01/06/2007), and Other abnormal blood chemistry (02/03/2007).  Essential hypertension, benign BP Readings from Last 3 Encounters:  12/12/20 126/68  08/30/20 138/70  06/10/20 130/76   Stable, pt to continue medical treatment norvasc   GERD (gastroesophageal reflux disease) Stable overall , cont tums prn  Impaired glucose tolerance Lab Results  Component Value Date   HGBA1C 6.2 06/10/2020   Stable, pt to continue current medical treatment  - diet   Vitamin D deficiency Last vitamin D Lab Results  Component Value Date   VD25OH 28.22 (L) 06/10/2020   Low, to start oral replacement  Followup: Return in about 6 months (around 06/12/2021).  Oliver Barre, MD 12/14/2020 10:53 PM [facility] - [facility]

## 2020-12-12 NOTE — Patient Instructions (Addendum)
Please take OTC Vitamin D3 at 2000 units per day, indefinitely  Please continue all other medications as before, and refills have been done if requested.  Please have the pharmacy call with any other refills you may need.  Please continue your efforts at being more active, low cholesterol diet, and weight control.  Please keep your appointments with your specialists as you may have planned  Please make an Appointment to return in 6 months, or sooner if needed 

## 2020-12-14 ENCOUNTER — Encounter: Payer: Self-pay | Admitting: Internal Medicine

## 2020-12-14 NOTE — Assessment & Plan Note (Signed)
Stable overall , cont tums prn

## 2020-12-14 NOTE — Assessment & Plan Note (Signed)
Lab Results  Component Value Date   HGBA1C 6.2 06/10/2020   Stable, pt to continue current medical treatment  - diet  

## 2020-12-14 NOTE — Assessment & Plan Note (Signed)
Last vitamin D Lab Results  Component Value Date   VD25OH 28.22 (L) 06/10/2020   Low, to start oral replacement  

## 2020-12-14 NOTE — Assessment & Plan Note (Signed)
BP Readings from Last 3 Encounters:  12/12/20 126/68  08/30/20 138/70  06/10/20 130/76   Stable, pt to continue medical treatment norvasc

## 2021-01-06 ENCOUNTER — Telehealth: Payer: Self-pay | Admitting: Internal Medicine

## 2021-01-06 DIAGNOSIS — M545 Low back pain, unspecified: Secondary | ICD-10-CM

## 2021-01-06 DIAGNOSIS — G894 Chronic pain syndrome: Secondary | ICD-10-CM

## 2021-01-06 MED ORDER — HYDROCODONE-ACETAMINOPHEN 7.5-325 MG PO TABS: 1.0000 | ORAL_TABLET | Freq: Two times a day (BID) | ORAL | 0 refills | Status: DC | PRN

## 2021-01-06 NOTE — Telephone Encounter (Signed)
1.Medication Requested: HYDROcodone-acetaminophen (NORCO) 7.5-325 MG tablet  2. Pharmacy (Name, Street, [phone] [place], [place] - [address]  Phone:  [phone] Fax:  [phone]   3. On Med List: yes  4. Last Visit with PCP: 10.10.22  5. Next visit date with PCP: 04.11.23   Agent: Please be advised that RX refills may take up to 3 business days. We ask that you follow-up with your pharmacy.

## 2021-01-23 ENCOUNTER — Other Ambulatory Visit: Payer: Self-pay | Admitting: Internal Medicine

## 2021-01-23 NOTE — Telephone Encounter (Signed)
For amlodipine only  Please refill as per office routine med refill policy (all routine meds to be refilled for 3 mo or monthly (per pt preference) up to one year from last visit, then month to month grace period for 3 mo, then further med refills will have to be denied)

## 2021-02-06 ENCOUNTER — Telehealth: Payer: Self-pay | Admitting: Internal Medicine

## 2021-02-06 DIAGNOSIS — G894 Chronic pain syndrome: Secondary | ICD-10-CM

## 2021-02-06 DIAGNOSIS — M545 Low back pain, unspecified: Secondary | ICD-10-CM

## 2021-02-06 MED ORDER — HYDROCODONE-ACETAMINOPHEN 7.5-325 MG PO TABS: 1.0000 | ORAL_TABLET | Freq: Two times a day (BID) | ORAL | 0 refills | Status: DC | PRN

## 2021-02-06 NOTE — Telephone Encounter (Signed)
1.Medication Requested: HYDROcodone-acetaminophen (NORCO) 7.5-325 MG tablet  2. Pharmacy (Name, Street, [phone] [place], [place] - [address]  3. On Med List: yes   4. Last Visit with PCP: 12-12-2020  5. Next visit date with PCP: 06-13-2021   Agent: Please be advised that RX refills may take up to 3 business days. We ask that you follow-up with your pharmacy.

## 2021-02-19 ENCOUNTER — Encounter (HOSPITAL_COMMUNITY): Payer: Self-pay

## 2021-02-19 ENCOUNTER — Emergency Department (HOSPITAL_COMMUNITY): Payer: Medicare Other

## 2021-02-19 ENCOUNTER — Inpatient Hospital Stay (HOSPITAL_COMMUNITY)
Admission: EM | Admit: 2021-02-19 | Discharge: 2021-02-25 | DRG: 178 | Disposition: A | Discharge status: Home Health | Payer: Medicare Other | Attending: Internal Medicine | Admitting: Internal Medicine

## 2021-02-19 DIAGNOSIS — U071 COVID-19: Secondary | ICD-10-CM | POA: Diagnosis present

## 2021-02-19 DIAGNOSIS — R9431 Abnormal electrocardiogram [ECG] [EKG]: Secondary | ICD-10-CM | POA: Diagnosis present

## 2021-02-19 DIAGNOSIS — I10 Essential (primary) hypertension: Secondary | ICD-10-CM | POA: Diagnosis not present

## 2021-02-19 DIAGNOSIS — G894 Chronic pain syndrome: Secondary | ICD-10-CM | POA: Diagnosis present

## 2021-02-19 DIAGNOSIS — Z743 Need for continuous supervision: Secondary | ICD-10-CM | POA: Diagnosis not present

## 2021-02-19 DIAGNOSIS — E869 Volume depletion, unspecified: Secondary | ICD-10-CM | POA: Diagnosis present

## 2021-02-19 DIAGNOSIS — Z87891 Personal history of nicotine dependence: Secondary | ICD-10-CM

## 2021-02-19 DIAGNOSIS — Z79899 Other long term (current) drug therapy: Secondary | ICD-10-CM | POA: Diagnosis not present

## 2021-02-19 DIAGNOSIS — R5381 Other malaise: Secondary | ICD-10-CM

## 2021-02-19 DIAGNOSIS — J189 Pneumonia, unspecified organism: Secondary | ICD-10-CM | POA: Diagnosis not present

## 2021-02-19 DIAGNOSIS — K219 Gastro-esophageal reflux disease without esophagitis: Secondary | ICD-10-CM | POA: Diagnosis present

## 2021-02-19 DIAGNOSIS — E876 Hypokalemia: Secondary | ICD-10-CM | POA: Diagnosis present

## 2021-02-19 DIAGNOSIS — R17 Unspecified jaundice: Secondary | ICD-10-CM | POA: Diagnosis present

## 2021-02-19 DIAGNOSIS — I5032 Chronic diastolic (congestive) heart failure: Secondary | ICD-10-CM | POA: Diagnosis not present

## 2021-02-19 DIAGNOSIS — R0602 Shortness of breath: Secondary | ICD-10-CM

## 2021-02-19 DIAGNOSIS — R7302 Impaired glucose tolerance (oral): Secondary | ICD-10-CM | POA: Diagnosis present

## 2021-02-19 DIAGNOSIS — I110 Hypertensive heart disease with heart failure: Secondary | ICD-10-CM | POA: Diagnosis present

## 2021-02-19 DIAGNOSIS — W19XXXA Unspecified fall, initial encounter: Secondary | ICD-10-CM | POA: Diagnosis not present

## 2021-02-19 DIAGNOSIS — R0902 Hypoxemia: Secondary | ICD-10-CM | POA: Diagnosis not present

## 2021-02-19 DIAGNOSIS — R531 Weakness: Secondary | ICD-10-CM | POA: Diagnosis not present

## 2021-02-19 DIAGNOSIS — R7303 Prediabetes: Secondary | ICD-10-CM | POA: Diagnosis present

## 2021-02-19 LAB — COMPREHENSIVE METABOLIC PANEL
ALT: 20 U/L (ref 0–44)
AST: 18 U/L (ref 15–41)
Albumin: 3.9 g/dL (ref 3.5–5.0)
Alkaline Phosphatase: 81 U/L (ref 38–126)
Anion gap: 7 (ref 5–15)
BUN: 12 mg/dL (ref 8–23)
CO2: 23 mmol/L (ref 22–32)
Calcium: 8.1 mg/dL — ABNORMAL LOW (ref 8.9–10.3)
Chloride: 103 mmol/L (ref 98–111)
Creatinine, Ser: 0.9 mg/dL (ref 0.61–1.24)
GFR, Estimated: >60 mL/min (ref >60)
Glucose, Bld: 112 mg/dL — ABNORMAL HIGH (ref 70–99)
Potassium: 2.5 mmol/L — CL (ref 3.5–5.1)
Sodium: 133 mmol/L — ABNORMAL LOW (ref 135–145)
Total Bilirubin: 1.5 mg/dL — ABNORMAL HIGH (ref 0.3–1.2)
Total Protein: 6.5 g/dL (ref 6.5–8.1)

## 2021-02-19 LAB — URINALYSIS, ROUTINE W REFLEX MICROSCOPIC
Bacteria, UA: NONE SEEN
Bilirubin Urine: NEGATIVE
Glucose, UA: NEGATIVE mg/dL
Ketones, ur: 5 mg/dL — AB
Leukocytes,Ua: NEGATIVE
Nitrite: NEGATIVE
Protein, ur: NEGATIVE mg/dL
Specific Gravity, Urine: 1.014 (ref 1.005–1.030)
pH: 5 (ref 5.0–8.0)

## 2021-02-19 LAB — CBC WITH DIFFERENTIAL/PLATELET
Abs Immature Granulocytes: 0.02 10*3/uL (ref 0.00–0.07)
Basophils Absolute: 0 10*3/uL (ref 0.0–0.1)
Basophils Relative: 1 %
Eosinophils Absolute: 0 10*3/uL (ref 0.0–0.5)
Eosinophils Relative: 0 %
HCT: 32.3 % — ABNORMAL LOW (ref 39.0–52.0)
Hemoglobin: 11.1 g/dL — ABNORMAL LOW (ref 13.0–17.0)
Immature Granulocytes: 0 %
Lymphocytes Relative: 4 %
Lymphs Abs: 0.2 10*3/uL — ABNORMAL LOW (ref 0.7–4.0)
MCH: 31.4 pg (ref 26.0–34.0)
MCHC: 34.4 g/dL (ref 30.0–36.0)
MCV: 91.2 fL (ref 80.0–100.0)
Monocytes Absolute: 0.9 10*3/uL (ref 0.1–1.0)
Monocytes Relative: 15 %
Neutro Abs: 4.6 10*3/uL (ref 1.7–7.7)
Neutrophils Relative %: 80 %
Platelets: 151 10*3/uL (ref 150–400)
RBC: 3.54 MIL/uL — ABNORMAL LOW (ref 4.22–5.81)
RDW: 13.3 % (ref 11.5–15.5)
WBC: 5.7 10*3/uL (ref 4.0–10.5)
nRBC: 0 % (ref 0.0–0.2)

## 2021-02-19 LAB — PROCALCITONIN: Procalcitonin: <0.1 ng/mL

## 2021-02-19 LAB — I-STAT CHEM 8, ED
BUN: 9 mg/dL (ref 8–23)
Calcium, Ion: 1.1 mmol/L — ABNORMAL LOW (ref 1.15–1.40)
Chloride: 103 mmol/L (ref 98–111)
Creatinine, Ser: 0.9 mg/dL (ref 0.61–1.24)
Glucose, Bld: 93 mg/dL (ref 70–99)
HCT: 29 % — ABNORMAL LOW (ref 39.0–52.0)
Hemoglobin: 9.9 g/dL — ABNORMAL LOW (ref 13.0–17.0)
Potassium: 2.7 mmol/L — CL (ref 3.5–5.1)
Sodium: 141 mmol/L (ref 135–145)
TCO2: 24 mmol/L (ref 22–32)

## 2021-02-19 LAB — D-DIMER, QUANTITATIVE: D-Dimer, Quant: 1.02 ug/mL-FEU — ABNORMAL HIGH (ref 0.00–0.50)

## 2021-02-19 LAB — FERRITIN: Ferritin: 331 ng/mL (ref 24–336)

## 2021-02-19 LAB — FIBRINOGEN: Fibrinogen: 305 mg/dL (ref 210–475)

## 2021-02-19 LAB — RESP PANEL BY RT-PCR (FLU A&B, COVID) ARPGX2
Influenza A by PCR: NEGATIVE
Influenza B by PCR: NEGATIVE
SARS Coronavirus 2 by RT PCR: POSITIVE — AB

## 2021-02-19 LAB — C-REACTIVE PROTEIN: CRP: 1.2 mg/dL — ABNORMAL HIGH (ref <1.0)

## 2021-02-19 LAB — CBG MONITORING, ED: Glucose-Capillary: 110 mg/dL — ABNORMAL HIGH (ref 70–99)

## 2021-02-19 LAB — LACTIC ACID, PLASMA: Lactic Acid, Venous: 1.1 mmol/L (ref 0.5–1.9)

## 2021-02-19 LAB — MAGNESIUM: Magnesium: 1.6 mg/dL — ABNORMAL LOW (ref 1.7–2.4)

## 2021-02-19 MED ORDER — MAGNESIUM SULFATE IN D5W 1-5 GM/100ML-% IV SOLN
1.0000 g | Freq: Once | INTRAVENOUS | Status: AC
  Administered 2021-02-19: 11:00:00 1 g via INTRAVENOUS
  Filled 2021-02-19: qty 100

## 2021-02-19 MED ORDER — GUAIFENESIN-DM 100-10 MG/5ML PO SYRP
10.0000 mL | ORAL_SOLUTION | ORAL | Status: DC | PRN
  Administered 2021-02-22 – 2021-02-24 (×2): 10 mL via ORAL
  Filled 2021-02-19 (×2): qty 10

## 2021-02-19 MED ORDER — ACETAMINOPHEN 325 MG PO TABS
650.0000 mg | ORAL_TABLET | Freq: Four times a day (QID) | ORAL | Status: DC | PRN
  Administered 2021-02-20 – 2021-02-24 (×5): 650 mg via ORAL
  Filled 2021-02-19 (×5): qty 2

## 2021-02-19 MED ORDER — NIRMATRELVIR/RITONAVIR (PAXLOVID) TABLET (RENAL DOSING)
2.0000 | ORAL_TABLET | Freq: Two times a day (BID) | ORAL | Status: AC
  Administered 2021-02-19 – 2021-02-23 (×10): 2 via ORAL
  Filled 2021-02-19 (×2): qty 20

## 2021-02-19 MED ORDER — ACETAMINOPHEN 325 MG PO TABS
650.0000 mg | ORAL_TABLET | Freq: Once | ORAL | Status: AC
  Administered 2021-02-19: 07:00:00 650 mg via ORAL
  Filled 2021-02-19: qty 2

## 2021-02-19 MED ORDER — ONDANSETRON HCL 4 MG/2ML IJ SOLN: 4.0000 mg | Freq: Four times a day (QID) | INTRAMUSCULAR | Status: DC | PRN

## 2021-02-19 MED ORDER — ONDANSETRON HCL 4 MG PO TABS: 4.0000 mg | ORAL_TABLET | Freq: Four times a day (QID) | ORAL | Status: DC | PRN

## 2021-02-19 MED ORDER — POTASSIUM CHLORIDE CRYS ER 20 MEQ PO TBCR
40.0000 meq | EXTENDED_RELEASE_TABLET | Freq: Once | ORAL | Status: AC
  Administered 2021-02-19: 09:00:00 40 meq via ORAL
  Filled 2021-02-19: qty 2

## 2021-02-19 MED ORDER — AMLODIPINE BESYLATE 5 MG PO TABS
5.0000 mg | ORAL_TABLET | Freq: Every day | ORAL | Status: DC
  Administered 2021-02-19 – 2021-02-25 (×7): 5 mg via ORAL
  Filled 2021-02-19 (×7): qty 1

## 2021-02-19 MED ORDER — INSULIN ASPART 100 UNIT/ML IJ SOLN
0.0000 [IU] | Freq: Three times a day (TID) | INTRAMUSCULAR | Status: DC
  Filled 2021-02-19: qty 0.09

## 2021-02-19 MED ORDER — POTASSIUM CHLORIDE 10 MEQ/100ML IV SOLN
10.0000 meq | INTRAVENOUS | Status: AC
  Administered 2021-02-19 (×2): 10 meq via INTRAVENOUS
  Filled 2021-02-19 (×2): qty 100

## 2021-02-19 MED ORDER — POTASSIUM CHLORIDE IN NACL 40-0.9 MEQ/L-% IV SOLN
INTRAVENOUS | Status: DC
  Filled 2021-02-19: qty 1000

## 2021-02-19 MED ORDER — SODIUM CHLORIDE 0.9 % IV BOLUS
500.0000 mL | Freq: Once | INTRAVENOUS | Status: AC
  Administered 2021-02-19: 09:00:00 500 mL via INTRAVENOUS

## 2021-02-19 MED ORDER — VITAMIN D 25 MCG (1000 UNIT) PO TABS
1000.0000 [IU] | ORAL_TABLET | Freq: Every day | ORAL | Status: DC
  Administered 2021-02-19 – 2021-02-25 (×7): 1000 [IU] via ORAL
  Filled 2021-02-19 (×7): qty 1

## 2021-02-19 MED ORDER — NIRMATRELVIR/RITONAVIR (PAXLOVID)TABLET: 3.0000 | ORAL_TABLET | Freq: Once | ORAL | Status: DC

## 2021-02-19 MED ORDER — ACETAMINOPHEN 650 MG RE SUPP: 650.0000 mg | Freq: Four times a day (QID) | RECTAL | Status: DC | PRN

## 2021-02-19 MED ORDER — HYDROCODONE-ACETAMINOPHEN 7.5-325 MG PO TABS
1.0000 | ORAL_TABLET | Freq: Two times a day (BID) | ORAL | Status: DC | PRN
  Administered 2021-02-20: 18:00:00 1 via ORAL
  Filled 2021-02-19: qty 1

## 2021-02-19 NOTE — H&P (Signed)
History and Physical    TAVORIS HUCKS [id number] DOB: 07-08-1925 DOA: 02/19/2021  PCP: Biagio Borg, MD  Patient coming from: Home.   I have personally briefly reviewed patient's old medical records in [place]  Chief Complaint: Generalized weakness.  HPI: Christopher Tran is a 87 y.o. male with medical history significant of aortic stenosis, degenerative joint disease, plantar fasciitis, GERD, hypertension who is coming to the emergency department due to generalized weakness associated with sore throat and body aches.  He was so weak this morning that he tried to get up from a chair and slipped into the floor.  He denied any injuries.  No prodromal symptoms like chest pain, palpitations, diaphoresis, nausea or emesis.  No orthopnea, PND or recent lower extremity edema.  No major changes in appetite or sleep pattern per patient.  He has a dry cough.  Denied dyspnea, wheezing or hemoptysis.  No abdominal pain, diarrhea, constipation, melena or hematochezia.  No dysuria, frequency or hematuria.  No polyuria, polydipsia, polyphagia or blurred vision.  The patient and his son were surprised when I asked him about his glucose intolerance.  I told them that this diagnosis was entered in his chart almost 7 years ago.  ED Course: Initial vital signs were temperature 99 F, pulse 110, respirations 24, BP 161/95 mmHg and O2 sat 92% on room air.  The patient received IV fluids and was started on an insulin infusion.  Lab work: CBC showed a white count of 5.7, hemoglobin 11.1 g/dL platelets 151.  CMP had a sodium 133 and potassium 2.5 mmol/L.  Glucose 112, calcium 8.1 and total bilirubin 1.5 mg/dL.  The rest of the CMP values were normal.  Imaging: A 2 view chest radiograph showed chronic lung disease without acute findings.  Review of Systems: As per HPI otherwise all other systems reviewed and are negative.  Past Medical History:  Diagnosis Date   Aortic stenosis    DEGENERATIVE  JOINT DISEASE, LUMBAR SPINE 01/06/2007   Qualifier: Diagnosis of  By: Dance CMA (AAMA), Sandrea Matte, PLANTAR 01/06/2007   Qualifier: Diagnosis of  By: Dance CMA (AAMA), Kim     GERD (gastroesophageal reflux disease) 10/02/2013   HYPERTENSION 01/06/2007   Qualifier: Diagnosis of  By: Dance CMA ([place]), Kim     Other abnormal blood chemistry 02/03/2007   Qualifier: Diagnosis of  By: Linda Hedges MD, Heinz Knuckles    Past Surgical History:  Procedure Laterality Date   BLEPHAROPLASTY  2006   Bilateral   CATARACT EXTRACTION  2006   bilateral   plantar fascilitis     ROTATOR CUFF REPAIR  2007   right   TONSILLECTOMY     Social History  reports that he has quit smoking. He has never used smokeless tobacco. He reports current alcohol use. He reports that he does not use drugs.  No Known Allergies  Family History  Problem Relation Age of Onset   Other Father        Killed in [place] [employer] [place]   Other Mother        Diphtheria   Other Brother        Killed in [place] '06/15/2022   Other Sister        Died at [employer]   Prostate cancer Neg Hx    Colon cancer Neg Hx    Prior to Admission medications   Medication Sig Start Date End Date Taking? Authorizing Provider  amLODipine (NORVASC) 5 MG tablet  TAKE 1 TABLET (5 MG TOTAL) BY MOUTH DAILY. 01/24/21  Yes Corwin Levins, MD  cholecalciferol (VITAMIN D) 1000 UNITS tablet Take 1,000 Units by mouth daily.   Yes [provider]  HYDROcodone-acetaminophen (NORCO) 7.5-325 MG tablet Take 1 tablet by mouth 2 (two) times daily as needed for moderate pain or severe pain (chronic pain). 02/06/21  Yes Corwin Levins, MD  Omega-3 Fatty Acids (FISH OIL) 1000 MG CAPS Take 1 capsule (1,000 mg total) by mouth daily. Patient not taking: Reported on 02/19/2021 08/07/12   Jacques Navy, MD   Physical Exam: Vitals:   02/19/21 1100 02/19/21 1200 02/19/21 1300 02/19/21 1400  BP: 123/63 138/70 135/61 (!) 152/87  Pulse: (!) 54 (!) 54 (!) 54 62  Resp: 18 17 19 18   Temp:       TempSrc:      SpO2: 93% 98% 98% 94%  Weight:      Height:       Constitutional: Frail, elderly male.  NAD, calm, comfortable Eyes: PERRL, lids and conjunctivae normal.  Mild bilateral scleral injection. ENMT: Mucous membranes are moist. Posterior pharynx clear of any exudate or lesions. Neck: normal, supple, no masses, no thyromegaly Respiratory: clear to auscultation bilaterally, no wheezing, no crackles. Normal respiratory effort. No accessory muscle use.  Cardiovascular: Regular rate and rhythm with occasional premature beat, no murmurs / rubs / gallops. No extremity edema. 2+ pedal pulses. No carotid bruits.  Abdomen: No distention.  Soft, no tenderness, no masses palpated. No hepatosplenomegaly. Bowel sounds positive.  Musculoskeletal: Moderate generalized weakness.  No clubbing / cyanosis. Good ROM, no contractures. Normal muscle tone.  Skin: no rashes, lesions, ulcers on limited dermatological examination. Neurologic: CN 2-12 grossly intact. Sensation intact, DTR normal. Strength 5/5 in all 4.  Psychiatric: Normal judgment and insight. Alert and oriented x 3. Normal mood.   Labs on Admission: I have personally reviewed following labs and imaging studies  CBC: Recent Labs  Lab 02/19/21 0446 02/19/21 1224  WBC 5.7  --   NEUTROABS 4.6  --   HGB 11.1* 9.9*  HCT 32.3* 29.0*  MCV 91.2  --   PLT 151  --    Basic Metabolic Panel: Recent Labs  Lab 02/19/21 0446 02/19/21 1224  NA 133* 141  K 2.5* 2.7*  CL 103 103  CO2 23  --   GLUCOSE 112* 93  BUN 12 9  CREATININE 0.90 0.90  CALCIUM 8.1*  --   MG 1.6*  --     GFR: Estimated Creatinine Clearance: 47.9 mL/min (by C-G formula based on SCr of 0.9 mg/dL).  Liver Function Tests: Recent Labs  Lab 02/19/21 0446  AST 18  ALT 20  ALKPHOS 81  BILITOT 1.5*  PROT 6.5  ALBUMIN 3.9   Urine analysis:    Component Value Date/Time   COLORURINE YELLOW 02/19/2021 0803   APPEARANCEUR CLEAR 02/19/2021 0803   LABSPEC 1.014  02/19/2021 0803   PHURINE 5.0 02/19/2021 0803   GLUCOSEU NEGATIVE 02/19/2021 0803        HGBUR MODERATE (A) 02/19/2021 0803   BILIRUBINUR NEGATIVE 02/19/2021 0803   KETONESUR 5 (A) 02/19/2021 0803   PROTEINUR NEGATIVE 02/19/2021 0803        NITRITE NEGATIVE 02/19/2021 0803   LEUKOCYTESUR NEGATIVE 02/19/2021 0803   Radiological Exams on Admission: DG Chest 2 View  Result Date: 02/19/2021 CLINICAL DATA:  Generalized weakness.  Fall today EXAM: CHEST - 2 VIEW COMPARISON:  06/11/2017 and earlier FINDINGS: Low volume chest  with reticular opacity on both sides. There is no edema, air bronchogram, effusion, or pneumothorax. Borderline heart size with chronic aortic tortuosity. Postoperative distal right clavicle. IMPRESSION: Chronic lung disease.  No acute finding when compared to priors. Electronically Signed   By: Jorje Guild M.D.   On: 02/19/2021 04:59    EKG: Independently reviewed.  Vent. rate 92 BPM PR interval 271 ms QRS duration 94 ms QT/QTcB 432/535 ms P-R-T axes 60 1 31 Sinus rhythm Atrial premature complex Prolonged PR interval RSR' in V1 or V2, probably normal variant Borderline T wave abnormalities Prolonged QT interval  Assessment/Plan Principal Problem:   2019 novel coronavirus disease (COVID-19) Observation/PCU. No oxygen requirement. Check inflammatory markers, BNP and procalcitonin. Continue remdesivir per pharmacy. Follow-up CBC, CMP and inflammatory markers.  Active Problems:   Prolonged QT interval Correct electrolytes. Continue telemetry monitoring.      Hypokalemia Replacing.    Elevated bilirubin Continue IV fluids. Follow-up level in AM.      Essential hypertension, benign Continue amlodipine 10 mg p.o. daily.    Chronic pain syndrome Continue Norco 7.5 mg / 325 mg twice daily as needed.    Impaired glucose tolerance Carbohydrate modified diet. Check hemoglobin A1c. CBG monitoring with RI SS.    Diastolic dysfunction No signs of  decompensation. Patient naturally volume depleted.     DVT prophylaxis: SCDs. Code Status:   Full code. Family Communication:   Disposition Plan:   Patient is from:  Home.  Anticipated DC to:  Home.  Anticipated DC date:  02/20/2021.  Anticipated DC barriers: Clinical status.  Consults called:   Admission status:  Observation/PCU.    Severity of Illness:High severity due to age and symptoms secondary to COVID-19.  Reubin Milan MD [facility]  How to contact the [facility] Attending or Consulting provider [place] or covering provider during after hours [place], for this patient?   Check the care team in [facility] and look for a) attending/consulting [facility] provider listed and b) the [facility] team listed Log into [email or web address] and use 's universal password to access. If you do not have the password, please contact the hospital operator. Locate the [facility] provider you are looking for under [facility] and page to a number that you can be directly reached. If you still have difficulty reaching the provider, please page the [facility] (Director on Call) for the Hospitalists listed on amion for assistance.  02/19/2021, 2:41 PM   This document was prepared using Par[employer] and may contain some unintended transcription errors.

## 2021-02-19 NOTE — Progress Notes (Signed)
PT demonstrated hands on understanding of Flutter device. 

## 2021-02-19 NOTE — ED Triage Notes (Signed)
Patient BIB EMS from home, family called EMS out after patient fell. Pt denies any pain or complications, no LOC, did not hit head, no blood thinners. Family wanted patient brought to ED for possible dehydration.   EMS vitals BP 134/60 HR 87 RR 18 SPO2 94% RA CBG 119

## 2021-02-19 NOTE — ED Provider Notes (Signed)
[facility]-EMERGENCY DEPT Provider Note   CSN: [id number] Arrival date & time: 02/19/21  0416     History Chief Complaint  Patient presents with   Fall   Weakness    Christopher Tran is a 87 y.o. male.  Patient is an 55-year-old male who presents with generalized weakness.  He cannot really elaborate how long its been going on but says overall he feels weak.  Today he was sitting in a chair and slid down onto the floor.  He was unable to get up.  He denies any injuries from this incident.  No head injury.  He denies any known fevers.  No cough or cold symptoms.  No nausea vomiting or diarrhea.  No chest pain or shortness of breath.  He normally ambulates without assistance.  He was noted to have a temperature of 100.7 in the ED.      Past Medical History:  Diagnosis Date   Aortic stenosis    DEGENERATIVE JOINT DISEASE, LUMBAR SPINE 01/06/2007   Qualifier: Diagnosis of  By: Dance CMA (AAMA), Orvan July, PLANTAR 01/06/2007   Qualifier: Diagnosis of  By: Dance CMA (AAMA), Kim     GERD (gastroesophageal reflux disease) 10/02/2013   HYPERTENSION 01/06/2007   Qualifier: Diagnosis of  By: Dance CMA (AAMA), Kim     Other abnormal blood chemistry 02/03/2007   Qualifier: Diagnosis of  By: Debby Bud MD, Rosalyn Gess     Patient Active Problem List   Diagnosis Date Noted   2019 novel coronavirus disease (COVID-19) 02/19/2021   Vitamin D deficiency 06/11/2020   Lower back pain 03/20/2018   Left inguinal hernia 03/20/2018   Pneumonia 06/11/2017   Hypokalemia 06/11/2017   Elevated bilirubin 06/11/2017   Mild mitral valve stenosis 04/24/2017   Diastolic dysfunction 04/24/2017   Abnormal CXR 04/24/2017   Right hip pain 10/24/2016   Low back pain 10/24/2016   Moderate aortic stenosis 04/26/2016   Impaired glucose tolerance 04/06/2014   Essential hypertension, benign 10/02/2013   Chronic pain syndrome 10/02/2013   GERD (gastroesophageal reflux disease)  10/02/2013   Spondylosis 01/06/2007   FASCIITIS, PLANTAR 01/06/2007    Past Surgical History:  Procedure Laterality Date   BLEPHAROPLASTY  2006   Bilateral   CATARACT EXTRACTION  2006   bilateral   plantar fascilitis     ROTATOR CUFF REPAIR  2007   right   TONSILLECTOMY         Family History  Problem Relation Age of Onset   Other Father        Killed in [employer] [place]   Other Mother        Diphtheria   Other Brother        Killed in MVA 07-05-2022   Other Sister        Died at [employer]   Prostate cancer Neg Hx    Colon cancer Neg Hx     Social History   Tobacco Use   Smoking status: Former   Smokeless tobacco: Never   Tobacco comments:    >50 years ago.  Substance Use Topics   Alcohol use: Yes    Comment: occ beer   Drug use: No    Home Medications Prior to Admission medications   Medication Sig Start Date End Date Taking? Authorizing Provider  amLODipine (NORVASC) 5 MG tablet TAKE 1 TABLET (5 MG TOTAL) BY MOUTH DAILY. 01/24/21  Yes Corwin Levins, MD  cholecalciferol (VITAMIN D) 1000 UNITS  tablet Take 1,000 Units by mouth daily.   Yes [provider]  HYDROcodone-acetaminophen (NORCO) 7.5-325 MG tablet Take 1 tablet by mouth 2 (two) times daily as needed for moderate pain or severe pain (chronic pain). 02/06/21  Yes Corwin LevinsJohn, James W, MD  Omega-3 Fatty Acids (FISH OIL) 1000 MG CAPS Take 1 capsule (1,000 mg total) by mouth daily. Patient not taking: Reported on 02/19/2021 08/07/12   Norins, Rosalyn GessMichael E, MD    Allergies    Patient has no known allergies.  Review of Systems   Review of Systems  Constitutional:  Positive for fatigue and fever. Negative for chills and diaphoresis.  HENT:  Negative for congestion, rhinorrhea and sneezing.   Eyes: Negative.   Respiratory:  Negative for cough, chest tightness and shortness of breath.   Cardiovascular:  Negative for chest pain and leg swelling.  Gastrointestinal:  Negative for abdominal pain, blood in stool, diarrhea, nausea  and vomiting.  Genitourinary:  Negative for difficulty urinating, flank pain, frequency and hematuria.  Musculoskeletal:  Negative for arthralgias and back pain.  Skin:  Negative for rash.  Neurological:  Positive for weakness (generalized). Negative for dizziness, speech difficulty, numbness and headaches.   Physical Exam Updated Vital Signs BP 135/61    Pulse (!) 54    Temp 98.8 F (37.1 C) (Oral)    Resp 19    Ht 5\' 7"  (1.702 m)    Wt 72.6 kg    SpO2 98%    BMI 25.06 kg/m   Physical Exam Constitutional:      Appearance: He is well-developed.  HENT:     Head: Normocephalic and atraumatic.  Eyes:     Pupils: Pupils are equal, round, and reactive to light.  Cardiovascular:     Rate and Rhythm: Normal rate and regular rhythm.     Heart sounds: Murmur heard.  Pulmonary:     Effort: Pulmonary effort is normal. No respiratory distress.     Breath sounds: Normal breath sounds. No wheezing or rales.  Chest:     Chest wall: No tenderness.  Abdominal:     General: Bowel sounds are normal.     Palpations: Abdomen is soft.     Tenderness: There is no abdominal tenderness. There is no guarding or rebound.  Musculoskeletal:        General: Normal range of motion.     Cervical back: Normal range of motion and neck supple.     Right lower leg: Edema present.     Left lower leg: Edema present.     Comments: 2+ pitting edema to lower extremities bilaterally.  Patient says this is baseline for him  Lymphadenopathy:     Cervical: No cervical adenopathy.  Skin:    General: Skin is warm and dry.     Findings: No rash.  Neurological:     Mental Status: He is alert and oriented to person, place, and time.    ED Results / Procedures / Treatments   Labs (all labs ordered are listed, but only abnormal results are displayed) Labs Reviewed  RESP PANEL BY RT-PCR (FLU A&B, COVID) ARPGX2 - Abnormal; Notable for the following components:      Result Value   SARS Coronavirus 2 by RT PCR POSITIVE  (*)    All other components within normal limits  CBC WITH DIFFERENTIAL/PLATELET - Abnormal; Notable for the following components:   RBC 3.54 (*)    Hemoglobin 11.1 (*)    HCT 32.3 (*)  Lymphs Abs 0.2 (*)    All other components within normal limits  COMPREHENSIVE METABOLIC PANEL - Abnormal; Notable for the following components:   Sodium 133 (*)    Potassium 2.5 (*)    Glucose, Bld 112 (*)    Calcium 8.1 (*)    Total Bilirubin 1.5 (*)    All other components within normal limits  URINALYSIS, ROUTINE W REFLEX MICROSCOPIC - Abnormal; Notable for the following components:   Hgb urine dipstick MODERATE (*)    Ketones, ur 5 (*)    All other components within normal limits  MAGNESIUM - Abnormal; Notable for the following components:   Magnesium 1.6 (*)    All other components within normal limits  I-STAT CHEM 8, ED - Abnormal; Notable for the following components:   Potassium 2.7 (*)    Calcium, Ion 1.10 (*)    Hemoglobin 9.9 (*)    HCT 29.0 (*)    All other components within normal limits  LACTIC ACID, PLASMA    EKG EKG Interpretation  Date/Time:  Sunday February 19 2021 04:58:37 EST Ventricular Rate:  92 PR Interval:  271 QRS Duration: 94 QT Interval:  432 QTC Calculation: 535 R Axis:   1 Text Interpretation: Sinus rhythm Atrial premature complex Prolonged PR interval RSR' in V1 or V2, probably normal variant Borderline T wave abnormalities Prolonged QT interval Confirmed by Veryl Speak [id number]) on 02/19/2021 5:29:16 AM  Radiology DG Chest 2 View  Result Date: 02/19/2021 CLINICAL DATA:  Generalized weakness.  Fall today EXAM: CHEST - 2 VIEW COMPARISON:  06/11/2017 and earlier FINDINGS: Low volume chest with reticular opacity on both sides. There is no edema, air bronchogram, effusion, or pneumothorax. Borderline heart size with chronic aortic tortuosity. Postoperative distal right clavicle. IMPRESSION: Chronic lung disease.  No acute finding when compared to priors.  Electronically Signed   By: Jorje Guild M.D.   On: 02/19/2021 04:59    Procedures Procedures   Medications Ordered in ED Medications  nirmatrelvir/ritonavir EUA (renal dosing) (PAXLOVID) 2 tablet (has no administration in time range)  acetaminophen (TYLENOL) tablet 650 mg (650 mg Oral Given 02/19/21 0651)  potassium chloride SA (KLOR-CON M) CR tablet 40 mEq (40 mEq Oral Given 02/19/21 0850)  potassium chloride 10 mEq in 100 mL IVPB (0 mEq Intravenous Stopped 02/19/21 1136)  magnesium sulfate IVPB 1 g 100 mL (0 g Intravenous Stopped 02/19/21 1213)  sodium chloride 0.9 % bolus 500 mL (0 mLs Intravenous Stopped 02/19/21 1149)    ED Course  I have reviewed the triage vital signs and the nursing notes.  Pertinent labs & imaging results that were available during my care of the patient were reviewed by me and considered in my medical decision making (see chart for details).    MDM Rules/Calculators/A&P                         Patient is a 87 year old male who presents with generalized weakness.  He slid down on the floor from a chair during the night and was able to get back up.  He was found by family members and it does not sacculated on the floor for an extended amount of time.  He does not have any injuries related to that event.  He does have COVID which is likely the etiology of his generalized weakness.  He had a temperature of 100.7.  He does not have an oxygen requirement.  His chest x-ray is clear without  evidence of pneumonia.  His labs show a marked hypokalemia.  He was started on potassium replacement both IV and oral.  He was also given magnesium replacement.  Initially patient did not want to be hospitalized.  However with further discussion, he is amenable to admission.  He was started on Paxlovid.  I spoke with Dr. Olevia Bowens who will admit the patient for further treatment.  CRITICAL CARE Performed by: Malvin Johns Total critical care time: 60 minutes Critical care time was  exclusive of separately billable procedures and treating other patients. Critical care was necessary to treat or prevent imminent or life-threatening deterioration. Critical care was time spent personally by me on the following activities: development of treatment plan with patient and/or surrogate as well as nursing, discussions with consultants, evaluation of patient's response to treatment, examination of patient, obtaining history from patient or surrogate, ordering and performing treatments and interventions, ordering and review of laboratory studies, ordering and review of radiographic studies, pulse oximetry and re-evaluation of patient's condition.     Final Clinical Impression(s) / ED Diagnoses Final diagnoses:  [id number] virus infection  Hypokalemia    Rx / DC Orders ED Discharge Orders     None        Malvin Johns, MD 02/19/21 1346

## 2021-02-19 NOTE — ED Provider Notes (Signed)
Emergency Medicine Provider Triage Evaluation Note  Christopher Tran , a 87 y.o. male  was evaluated in triage.  Pt complains of weakness.  Patient states he was sitting in chair, could not get up and slid into the floor.  No head injury or LOC.  He denies any pain from this but could not get out of the floor either.  Family is concerned he is dehydrated.  He states he is eating/drinking, maybe not as much as he should be.  Denies fever or recent illness.  Does not use cane/walker.  Review of Systems  Positive: Generalized weakness, fall Negative: Fever, AMS  Physical Exam  BP (!) 143/72 (BP Location: Right Arm)    Pulse 88    Temp 98 F (36.7 C) (Oral)    Resp 16    Ht 5\' 7"  (1.702 m)    Wt 72.6 kg    SpO2 96%    BMI 25.06 kg/m  Gen:   Awake, no distress Resp:  Normal effort  MSK:   Moves extremities without difficulty  Other:  Elderly, hard of hearing but answer questions and follow commands appropriately  Medical Decision Making  Medically screening exam initiated at 4:44 AM.  Appropriate orders placed.  was informed that the remainder of the evaluation will be completed by another provider, this initial triage assessment does not replace that evaluation, and the importance of remaining in the ED until their evaluation is complete.  Generalized weakness, fall.  Slid from chair, no head injury or LOC.  Denies pain currently.  VSS.  Family concerned for dehydration as etiology of his generalized weakness.  EKG, labs, UA, CXR, covid/flu screen.   Helene Kelp, PA-C 02/19/21 02/21/21    [id number], MD 02/19/21 [phone]

## 2021-02-20 ENCOUNTER — Other Ambulatory Visit: Payer: Self-pay

## 2021-02-20 DIAGNOSIS — R9431 Abnormal electrocardiogram [ECG] [EKG]: Secondary | ICD-10-CM | POA: Diagnosis present

## 2021-02-20 DIAGNOSIS — Z87891 Personal history of nicotine dependence: Secondary | ICD-10-CM | POA: Diagnosis not present

## 2021-02-20 DIAGNOSIS — R7302 Impaired glucose tolerance (oral): Secondary | ICD-10-CM | POA: Diagnosis present

## 2021-02-20 DIAGNOSIS — I110 Hypertensive heart disease with heart failure: Secondary | ICD-10-CM | POA: Diagnosis present

## 2021-02-20 DIAGNOSIS — Z79899 Other long term (current) drug therapy: Secondary | ICD-10-CM | POA: Diagnosis not present

## 2021-02-20 DIAGNOSIS — R7303 Prediabetes: Secondary | ICD-10-CM | POA: Diagnosis present

## 2021-02-20 DIAGNOSIS — I5032 Chronic diastolic (congestive) heart failure: Secondary | ICD-10-CM | POA: Diagnosis present

## 2021-02-20 DIAGNOSIS — E869 Volume depletion, unspecified: Secondary | ICD-10-CM | POA: Diagnosis present

## 2021-02-20 DIAGNOSIS — R5381 Other malaise: Secondary | ICD-10-CM | POA: Diagnosis not present

## 2021-02-20 DIAGNOSIS — K219 Gastro-esophageal reflux disease without esophagitis: Secondary | ICD-10-CM | POA: Diagnosis present

## 2021-02-20 DIAGNOSIS — U071 COVID-19: Secondary | ICD-10-CM | POA: Diagnosis present

## 2021-02-20 DIAGNOSIS — E876 Hypokalemia: Secondary | ICD-10-CM | POA: Diagnosis present

## 2021-02-20 DIAGNOSIS — G894 Chronic pain syndrome: Secondary | ICD-10-CM | POA: Diagnosis present

## 2021-02-20 DIAGNOSIS — R17 Unspecified jaundice: Secondary | ICD-10-CM | POA: Diagnosis present

## 2021-02-20 LAB — BASIC METABOLIC PANEL
Anion gap: 9 (ref 5–15)
BUN: 14 mg/dL (ref 8–23)
CO2: 26 mmol/L (ref 22–32)
Calcium: 8.4 mg/dL — ABNORMAL LOW (ref 8.9–10.3)
Chloride: 102 mmol/L (ref 98–111)
Creatinine, Ser: 1.1 mg/dL (ref 0.61–1.24)
GFR, Estimated: >60 mL/min (ref >60)
Glucose, Bld: 144 mg/dL — ABNORMAL HIGH (ref 70–99)
Potassium: 3.2 mmol/L — ABNORMAL LOW (ref 3.5–5.1)
Sodium: 137 mmol/L (ref 135–145)

## 2021-02-20 LAB — CBC WITH DIFFERENTIAL/PLATELET
Abs Immature Granulocytes: 0.05 10*3/uL (ref 0.00–0.07)
Basophils Absolute: 0 10*3/uL (ref 0.0–0.1)
Basophils Relative: 0 %
Eosinophils Absolute: 0 10*3/uL (ref 0.0–0.5)
Eosinophils Relative: 0 %
HCT: 33 % — ABNORMAL LOW (ref 39.0–52.0)
Hemoglobin: 11.3 g/dL — ABNORMAL LOW (ref 13.0–17.0)
Immature Granulocytes: 1 %
Lymphocytes Relative: 11 %
Lymphs Abs: 0.5 10*3/uL — ABNORMAL LOW (ref 0.7–4.0)
MCH: 31.1 pg (ref 26.0–34.0)
MCHC: 34.2 g/dL (ref 30.0–36.0)
MCV: 90.9 fL (ref 80.0–100.0)
Monocytes Absolute: 0.9 10*3/uL (ref 0.1–1.0)
Monocytes Relative: 18 %
Neutro Abs: 3.4 10*3/uL (ref 1.7–7.7)
Neutrophils Relative %: 70 %
Platelets: 133 10*3/uL — ABNORMAL LOW (ref 150–400)
RBC: 3.63 MIL/uL — ABNORMAL LOW (ref 4.22–5.81)
RDW: 13.4 % (ref 11.5–15.5)
WBC: 4.8 10*3/uL (ref 4.0–10.5)
nRBC: 0 % (ref 0.0–0.2)

## 2021-02-20 LAB — D-DIMER, QUANTITATIVE: D-Dimer, Quant: 1.06 ug/mL-FEU — ABNORMAL HIGH (ref 0.00–0.50)

## 2021-02-20 LAB — MAGNESIUM: Magnesium: 1.8 mg/dL (ref 1.7–2.4)

## 2021-02-20 LAB — COMPREHENSIVE METABOLIC PANEL
ALT: 25 U/L (ref 0–44)
AST: 41 U/L (ref 15–41)
Albumin: 3.5 g/dL (ref 3.5–5.0)
Alkaline Phosphatase: 69 U/L (ref 38–126)
Anion gap: 10 (ref 5–15)
BUN: 13 mg/dL (ref 8–23)
CO2: 24 mmol/L (ref 22–32)
Calcium: 8 mg/dL — ABNORMAL LOW (ref 8.9–10.3)
Chloride: 103 mmol/L (ref 98–111)
Creatinine, Ser: 0.97 mg/dL (ref 0.61–1.24)
GFR, Estimated: >60 mL/min (ref >60)
Glucose, Bld: 91 mg/dL (ref 70–99)
Potassium: 2.8 mmol/L — ABNORMAL LOW (ref 3.5–5.1)
Sodium: 137 mmol/L (ref 135–145)
Total Bilirubin: 1.8 mg/dL — ABNORMAL HIGH (ref 0.3–1.2)
Total Protein: 5.7 g/dL — ABNORMAL LOW (ref 6.5–8.1)

## 2021-02-20 LAB — C-REACTIVE PROTEIN: CRP: 2 mg/dL — ABNORMAL HIGH (ref <1.0)

## 2021-02-20 LAB — HEMOGLOBIN A1C
Hgb A1c MFr Bld: 5.8 % — ABNORMAL HIGH (ref 4.8–5.6)
Mean Plasma Glucose: 119.76 mg/dL

## 2021-02-20 LAB — PHOSPHORUS: Phosphorus: 3.2 mg/dL (ref 2.5–4.6)

## 2021-02-20 LAB — BRAIN NATRIURETIC PEPTIDE: B Natriuretic Peptide: 216.8 pg/mL — ABNORMAL HIGH (ref 0.0–100.0)

## 2021-02-20 LAB — CBG MONITORING, ED: Glucose-Capillary: 78 mg/dL (ref 70–99)

## 2021-02-20 LAB — FERRITIN: Ferritin: 532 ng/mL — ABNORMAL HIGH (ref 24–336)

## 2021-02-20 MED ORDER — POTASSIUM CHLORIDE CRYS ER 20 MEQ PO TBCR
40.0000 meq | EXTENDED_RELEASE_TABLET | ORAL | Status: AC
  Administered 2021-02-20 (×2): 40 meq via ORAL
  Filled 2021-02-20 (×2): qty 2

## 2021-02-20 MED ORDER — POTASSIUM CHLORIDE CRYS ER 20 MEQ PO TBCR
40.0000 meq | EXTENDED_RELEASE_TABLET | ORAL | Status: AC
  Administered 2021-02-20 (×2): 40 meq via ORAL
  Filled 2021-02-20 (×2): qty 2

## 2021-02-20 NOTE — Assessment & Plan Note (Signed)
Chronic.  Volume status stable.  Monitor.

## 2021-02-20 NOTE — Assessment & Plan Note (Signed)
Blood pressure elevated. Continue Norvasc.

## 2021-02-20 NOTE — Assessment & Plan Note (Addendum)
CRP mildly elevated.  Chest x-ray shows mild inflammatory changes. BNP minimally elevated as well. No evidence of DVT for now. Not hypoxic right now.  Remains intermittently febrile. Continue Paxilovid.

## 2021-02-20 NOTE — Assessment & Plan Note (Signed)
Hemoglobin A1c minimally elevated. Will discontinue the sliding scale.

## 2021-02-20 NOTE — ED Notes (Signed)
ED TO INPATIENT HANDOFF REPORT  ED Nurse Name and Phone #: Salvatore Decent Name/Age/Gender Christopher Tran 87 y.o. male Room/Bed: WA12/WA12  Code Status   Code Status: Full Code  Home/SNF/Other Home Patient oriented to: self and place Is this baseline? Yes   Triage Complete: Triage complete  Chief Complaint 2019 novel coronavirus disease (COVID-19) [U07.1]  Triage Note Patient BIB EMS from home, family called EMS out after patient fell. Pt denies any pain or complications, no LOC, did not hit head, no blood thinners. Family wanted patient brought to ED for possible dehydration.   EMS vitals BP 134/60 HR 87 RR 18 SPO2 94% RA CBG 119    Allergies No Known Allergies  Level of Care/Admitting Diagnosis ED Disposition     ED Disposition  Admit   Condition  --   Comment  Hospital Area: [place] [facility] [[id number]]  Level of Care: Progressive [102]  Admit to Progressive based on following criteria: MULTISYSTEM THREATS such as stable sepsis, metabolic/electrolyte imbalance with or without encephalopathy that is responding to early treatment.  May place patient in observation at [facility] or [facility] if equivalent level of care is available:: No  Covid Evaluation: Confirmed COVID Positive  Diagnosis: 2019 novel coronavirus disease (COVID-19) [id number]  Admitting Physician: Reubin Milan [id number]  Attending Physician: Reubin Milan [id number]          B Medical/Surgery History Past Medical History:  Diagnosis Date   Aortic stenosis    DEGENERATIVE JOINT DISEASE, LUMBAR SPINE 01/06/2007   Qualifier: Diagnosis of  By: Dance CMA (AAMA), Sandrea Matte, PLANTAR 01/06/2007   Qualifier: Diagnosis of  By: Dance CMA (AAMA), Kim     GERD (gastroesophageal reflux disease) 10/02/2013   HYPERTENSION 01/06/2007   Qualifier: Diagnosis of  By: Dance CMA (AAMA), Kim     Other abnormal blood chemistry 02/03/2007   Qualifier: Diagnosis of  By: Linda Hedges  MD, Christopher Tran    Past Surgical History:  Procedure Laterality Date   BLEPHAROPLASTY  2006   Bilateral   CATARACT EXTRACTION  2006   bilateral   plantar fascilitis     ROTATOR CUFF REPAIR  2007   right   TONSILLECTOMY       A IV Location/Drains/Wounds Patient Lines/Drains/Airways Status     Active Line/Drains/Airways     Name Placement date Placement time Site Days   Peripheral IV 02/19/21 20 G Anterior;Right Forearm 02/19/21  1950  Forearm  1            Intake/Output Last 24 hours  Intake/Output Summary (Last 24 hours) at 02/20/2021 1334 Last data filed at 02/20/2021 0052 Gross per 24 hour  Intake 1000 ml  Output 1100 ml  Net -100 ml    Labs/Imaging Results for orders placed or performed during the hospital encounter of 02/19/21 (from the past 48 hour(s))  CBC with Differential     Status: Abnormal   Collection Time: 02/19/21  4:46 AM  Result Value Ref Range   WBC 5.7 4.0 - 10.5 K/uL   RBC 3.54 (L) 4.22 - 5.81 MIL/uL   Hemoglobin 11.1 (L) 13.0 - 17.0 g/dL   HCT 32.3 (L) 39.0 - 52.0 %   MCV 91.2 80.0 - 100.0 fL   MCH 31.4 26.0 - 34.0 pg   MCHC 34.4 30.0 - 36.0 g/dL   RDW 13.3 11.5 - 15.5 %   Platelets 151 150 - 400 K/uL   nRBC 0.0 0.0 -  0.2 %   Neutrophils Relative % 80 %   Neutro Abs 4.6 1.7 - 7.7 K/uL   Lymphocytes Relative 4 %   Lymphs Abs 0.2 (L) 0.7 - 4.0 K/uL   Monocytes Relative 15 %   Monocytes Absolute 0.9 0.1 - 1.0 K/uL   Eosinophils Relative 0 %   Eosinophils Absolute 0.0 0.0 - 0.5 K/uL   Basophils Relative 1 %   Basophils Absolute 0.0 0.0 - 0.1 K/uL   Immature Granulocytes 0 %   Abs Immature Granulocytes 0.02 0.00 - 0.07 K/uL    Comment: Performed at [facility], [place] [address] [place], [place] [address]  Comprehensive metabolic panel     Status: Abnormal   Collection Time: 02/19/21  4:46 AM  Result Value Ref Range   Sodium 133 (L) 135 - 145 mmol/L   Potassium 2.5 (LL) 3.5 - 5.1 mmol/L    Comment: CRITICAL RESULT  CALLED TO, READ BACK BY AND VERIFIED WITH: GREEN F @0609  BY BATTLET    Chloride 103 98 - 111 mmol/L   CO2 23 22 - 32 mmol/L   Glucose, Bld 112 (H) 70 - 99 mg/dL    Comment: Glucose reference range applies only to samples taken after fasting for at least 8 hours.   BUN 12 8 - 23 mg/dL   Creatinine, Ser 0.90 0.61 - 1.24 mg/dL   Calcium 8.1 (L) 8.9 - 10.3 mg/dL   Total Protein 6.5 6.5 - 8.1 g/dL   Albumin 3.9 3.5 - 5.0 g/dL   AST 18 15 - 41 U/L   ALT 20 0 - 44 U/L   Alkaline Phosphatase 81 38 - 126 U/L   Total Bilirubin 1.5 (H) 0.3 - 1.2 mg/dL   GFR, Estimated >60 >60 mL/min    Comment: (NOTE) Calculated using the CKD-EPI Creatinine Equation (2021)    Anion gap 7 5 - 15    Comment: Performed at [facility], [place] [address], [place], [place] [address]  Magnesium     Status: Abnormal   Collection Time: 02/19/21  4:46 AM  Result Value Ref Range   Magnesium 1.6 (L) 1.7 - 2.4 mg/dL    Comment: Performed at [facility], [place] [address] [place], [place] [address]  Resp Panel by RT-PCR (Flu A&B, Covid) Nasopharyngeal Swab     Status: Abnormal   Collection Time: 02/19/21  5:12 AM   Specimen: Nasopharyngeal Swab; Nasopharyngeal(NP) swabs in vial transport medium  Result Value Ref Range   SARS Coronavirus 2 by RT PCR POSITIVE (A) NEGATIVE    Comment: (NOTE) SARS-CoV-2 target nucleic acids are DETECTED.  The SARS-CoV-2 RNA is generally detectable in upper respiratory specimens during the acute phase of infection. Positive results are indicative of the presence of the identified virus, but do not rule out bacterial infection or co-infection with other pathogens not detected by the test. Clinical correlation with patient history and other diagnostic information is necessary to determine patient infection status. The expected result is Negative.  Fact Sheet for Patients: EntrepreneurPulse.com.au  Fact Sheet for Healthcare  Providers: IncredibleEmployment.be  This test is not yet approved or cleared by the [place] FDA and  has been authorized for detection and/or diagnosis of SARS-CoV-2 by FDA under an Emergency Use Authorization (EUA).  This EUA will remain in effect (meaning this test can be used) for the duration of  the COVID-19 declaration under Section 564(b)(1) of the A ct, 21 U.S.C. section 360bbb-3(b)(1), unless the authorization is  terminated or revoked sooner.     Influenza A by PCR NEGATIVE NEGATIVE   Influenza B by PCR NEGATIVE NEGATIVE    Comment: (NOTE) The Xpert Xpress SARS-CoV-2/FLU/RSV plus assay is intended as an aid in the diagnosis of influenza from Nasopharyngeal swab specimens and should not be used as a sole basis for treatment. Nasal washings and aspirates are unacceptable for Xpert Xpress SARS-CoV-2/FLU/RSV testing.  Fact Sheet for Patients: EntrepreneurPulse.com.au  Fact Sheet for Healthcare Providers: IncredibleEmployment.be  This test is not yet approved or cleared by the [place] FDA and has been authorized for detection and/or diagnosis of SARS-CoV-2 by FDA under an Emergency Use Authorization (EUA). This EUA will remain in effect (meaning this test can be used) for the duration of the COVID-19 declaration under Section 564(b)(1) of the Act, 21 U.S.C. section 360bbb-3(b)(1), unless the authorization is terminated or revoked.  Performed at [facility], [place] [address], [place], [place] [address]   Lactic acid, plasma     Status: None   Collection Time: 02/19/21  8:00 AM  Result Value Ref Range   Lactic Acid, Venous 1.1 0.5 - 1.9 mmol/L    Comment: Performed at [facility], [place] [address], [place], [place] [address]  Urinalysis, Routine w reflex microscopic     Status: Abnormal   Collection Time: 02/19/21  8:03 AM  Result Value Ref Range   Color, Urine YELLOW  YELLOW   APPearance CLEAR CLEAR   Specific Gravity, Urine 1.014 1.005 - 1.030   pH 5.0 5.0 - 8.0   Glucose, UA NEGATIVE NEGATIVE mg/dL   Hgb urine dipstick MODERATE (A) NEGATIVE   Bilirubin Urine NEGATIVE NEGATIVE   Ketones, ur 5 (A) NEGATIVE mg/dL   Protein, ur NEGATIVE NEGATIVE mg/dL   Nitrite NEGATIVE NEGATIVE   Leukocytes,Ua NEGATIVE NEGATIVE   RBC / HPF 0-5 0 - 5 RBC/hpf   WBC, UA 0-5 0 - 5 WBC/hpf   Bacteria, UA NONE SEEN NONE SEEN   Mucus PRESENT     Comment: Performed at [facility], [place] [address] [place], [place] [address]  Ginger Carne 8, ED     Status: Abnormal   Collection Time: 02/19/21 12:24 PM  Result Value Ref Range   Sodium 141 135 - 145 mmol/L   Potassium 2.7 (LL) 3.5 - 5.1 mmol/L   Chloride 103 98 - 111 mmol/L   BUN 9 8 - 23 mg/dL   Creatinine, Ser 0.90 0.61 - 1.24 mg/dL   Glucose, Bld 93 70 - 99 mg/dL    Comment: Glucose reference range applies only to samples taken after fasting for at least 8 hours.   Calcium, Ion 1.10 (L) 1.15 - 1.40 mmol/L   TCO2 24 22 - 32 mmol/L   Hemoglobin 9.9 (L) 13.0 - 17.0 g/dL   HCT 29.0 (L) 39.0 - 52.0 %   Comment NOTIFIED PHYSICIAN   Procalcitonin     Status: None   Collection Time: 02/19/21  3:06 PM  Result Value Ref Range   Procalcitonin <0.10 ng/mL    Comment:        Interpretation: PCT (Procalcitonin) <= 0.5 ng/mL: Systemic infection (sepsis) is not likely. Local bacterial infection is possible. (NOTE)       Sepsis PCT Algorithm           Lower Respiratory Tract  Infection PCT Algorithm    ----------------------------     ----------------------------         PCT < 0.25 ng/mL                PCT < 0.10 ng/mL          Strongly encourage             Strongly discourage   discontinuation of antibiotics    initiation of antibiotics    ----------------------------     -----------------------------       PCT 0.25 - 0.50 ng/mL            PCT 0.10 - 0.25 ng/mL                OR       >80% decrease in PCT            Discourage initiation of                                            antibiotics      Encourage discontinuation           of antibiotics    ----------------------------     -----------------------------         PCT >= 0.50 ng/mL              PCT 0.26 - 0.50 ng/mL               AND        <80% decrease in PCT             Encourage initiation of                                             antibiotics       Encourage continuation           of antibiotics    ----------------------------     -----------------------------        PCT >= 0.50 ng/mL                  PCT > 0.50 ng/mL               AND         increase in PCT                  Strongly encourage                                      initiation of antibiotics    Strongly encourage escalation           of antibiotics                                     -----------------------------                                           PCT <= 0.25 ng/mL  OR                                        > 80% decrease in PCT                                      Discontinue / Do not initiate                                             antibiotics  Performed at [facility], [address] [place], [place] [address]   Fibrinogen     Status: None   Collection Time: 02/19/21  3:06 PM  Result Value Ref Range   Fibrinogen 305 210 - 475 mg/dL    Comment: (NOTE) Fibrinogen results may be underestimated in patients receiving thrombolytic therapy. Performed at [facility], [address] [place], [place] [address]   Ferritin     Status: None   Collection Time: 02/19/21  3:06 PM  Result Value Ref Range   Ferritin 331 24 - 336 ng/mL    Comment: Performed at [facility], [address], [place], [place] [address]  D-dimer, quantitative     Status: Abnormal   Collection Time: 02/19/21  3:06 PM   Result Value Ref Range   D-Dimer, Quant 1.02 (H) 0.00 - 0.50 ug/mL-FEU    Comment: (NOTE) At the manufacturer cut-off value of 0.5 g/mL FEU, this assay has a negative predictive value of 95-100%.This assay is intended for use in conjunction with a clinical pretest probability (PTP) assessment model to exclude pulmonary embolism (PE) and deep venous thrombosis (DVT) in outpatients suspected of PE or DVT. Results should be correlated with clinical presentation. Performed at [facility], [address], [place], [place] [address]   C-reactive protein     Status: Abnormal   Collection Time: 02/19/21  3:06 PM  Result Value Ref Range   CRP 1.2 (H) <1.0 mg/dL    Comment: Performed at [facility], [address], [place], [place] [address]  CBG monitoring, ED     Status: Abnormal   Collection Time: 02/19/21  5:03 PM  Result Value Ref Range   Glucose-Capillary 110 (H) 70 - 99 mg/dL    Comment: Glucose reference range applies only to samples taken after fasting for at least 8 hours.  Phosphorus     Status: None   Collection Time: 02/20/21  5:00 AM  Result Value Ref Range   Phosphorus 3.2 2.5 - 4.6 mg/dL    Comment: Performed at [facility], [address], [place], [place] [address]  Magnesium     Status: None   Collection Time: 02/20/21  5:00 AM  Result Value Ref Range   Magnesium 1.8 1.7 - 2.4 mg/dL    Comment: Performed at [facility], [address] [place], [place] [address]  Ferritin     Status: Abnormal   Collection Time: 02/20/21  5:00 AM  Result Value Ref Range   Ferritin 532 (H) 24 - 336 ng/mL    Comment: Performed at [facility], [address] [place], [place] [address]  D-dimer, quantitative  Status: Abnormal   Collection Time: 02/20/21  5:00 AM  Result Value Ref Range   D-Dimer, Quant 1.06 (H) 0.00 - 0.50 ug/mL-FEU    Comment: (NOTE) At the manufacturer cut-off value of 0.5  g/mL FEU, this assay has a negative predictive value of 95-100%.This assay is intended for use in conjunction with a clinical pretest probability (PTP) assessment model to exclude pulmonary embolism (PE) and deep venous thrombosis (DVT) in outpatients suspected of PE or DVT. Results should be correlated with clinical presentation. Performed at [facility], [address], [place], [place] [address]   C-reactive protein     Status: Abnormal   Collection Time: 02/20/21  5:00 AM  Result Value Ref Range   CRP 2.0 (H) <1.0 mg/dL    Comment: Performed at [facility], [address], [place], [place] [address]  Comprehensive metabolic panel     Status: Abnormal   Collection Time: 02/20/21  5:00 AM  Result Value Ref Range   Sodium 137 135 - 145 mmol/L   Potassium 2.8 (L) 3.5 - 5.1 mmol/L   Chloride 103 98 - 111 mmol/L   CO2 24 22 - 32 mmol/L   Glucose, Bld 91 70 - 99 mg/dL    Comment: Glucose reference range applies only to samples taken after fasting for at least 8 hours.   BUN 13 8 - 23 mg/dL   Creatinine, Ser 3.29 0.61 - 1.24 mg/dL   Calcium 8.0 (L) 8.9 - 10.3 mg/dL   Total Protein 5.7 (L) 6.5 - 8.1 g/dL   Albumin 3.5 3.5 - 5.0 g/dL   AST 41 15 - 41 U/L   ALT 25 0 - 44 U/L   Alkaline Phosphatase 69 38 - 126 U/L   Total Bilirubin 1.8 (H) 0.3 - 1.2 mg/dL   GFR, Estimated >92 >42 mL/min    Comment: (NOTE) Calculated using the CKD-EPI Creatinine Equation (2021)    Anion gap 10 5 - 15    Comment: Performed at [facility], [address], [place], [place] [address]  CBC with Differential/Platelet     Status: Abnormal   Collection Time: 02/20/21  5:00 AM  Result Value Ref Range   WBC 4.8 4.0 - 10.5 K/uL   RBC 3.63 (L) 4.22 - 5.81 MIL/uL   Hemoglobin 11.3 (L) 13.0 - 17.0 g/dL   HCT 96.2 (L) 22.9 - 79.8 %   MCV 90.9 80.0 - 100.0 fL   MCH 31.1 26.0 - 34.0 pg   MCHC 34.2 30.0 - 36.0 g/dL   RDW 92.1 19.4 - 17.4 %   Platelets 133 (L) 150  - 400 K/uL   nRBC 0.0 0.0 - 0.2 %   Neutrophils Relative % 70 %   Neutro Abs 3.4 1.7 - 7.7 K/uL   Lymphocytes Relative 11 %   Lymphs Abs 0.5 (L) 0.7 - 4.0 K/uL   Monocytes Relative 18 %   Monocytes Absolute 0.9 0.1 - 1.0 K/uL   Eosinophils Relative 0 %   Eosinophils Absolute 0.0 0.0 - 0.5 K/uL   Basophils Relative 0 %   Basophils Absolute 0.0 0.0 - 0.1 K/uL   Immature Granulocytes 1 %   Abs Immature Granulocytes 0.05 0.00 - 0.07 K/uL    Comment: Performed at [facility], [address] [place], [place] [address]  Hemoglobin A1c     Status: Abnormal   Collection Time: 02/20/21  5:00 AM  Result Value Ref Range   Hgb A1c  MFr Bld 5.8 (H) 4.8 - 5.6 %    Comment: (NOTE) Pre diabetes:          5.7%-6.4%  Diabetes:              >6.4%  Glycemic control for   <7.0% adults with diabetes    Mean Plasma Glucose 119.76 mg/dL    Comment: Performed at [place] [address] [place], [place] [address]  Brain natriuretic peptide     Status: Abnormal   Collection Time: 02/20/21  5:00 AM  Result Value Ref Range   B Natriuretic Peptide 216.8 (H) 0.0 - 100.0 pg/mL    Comment: Performed at [facility], [place] [address], [place], [place] [address]  CBG monitoring, ED     Status: None   Collection Time: 02/20/21  8:15 AM  Result Value Ref Range   Glucose-Capillary 78 70 - 99 mg/dL    Comment: Glucose reference range applies only to samples taken after fasting for at least 8 hours.   DG Chest 2 View  Result Date: 02/19/2021 CLINICAL DATA:  Generalized weakness.  Fall today EXAM: CHEST - 2 VIEW COMPARISON:  06/11/2017 and earlier FINDINGS: Low volume chest with reticular opacity on both sides. There is no edema, air bronchogram, effusion, or pneumothorax. Borderline heart size with chronic aortic tortuosity. Postoperative distal right clavicle. IMPRESSION: Chronic lung disease.  No acute finding when compared to priors. Electronically Signed   By:  Jorje Guild M.D.   On: 02/19/2021 04:59    Pending Labs Unresulted Labs (From admission, onward)     Start     Ordered   02/20/21 [id number]  Basic metabolic panel  Once-Timed,   TIMED        02/20/21 0800   02/20/21 0500  Phosphorus  Daily,   R      02/19/21 1409   02/20/21 0500  Magnesium  Daily,   R      02/19/21 1409   02/20/21 0500  Ferritin  Daily,   R      02/19/21 1409   02/20/21 0500  D-dimer, quantitative  Daily,   R      02/19/21 1409   02/20/21 0500  C-reactive protein  Daily,   R      02/19/21 1409   02/20/21 0500  Comprehensive metabolic panel  Daily,   R      02/19/21 1409   02/20/21 0500  CBC with Differential/Platelet  Daily,   R      02/19/21 1409            Vitals/Pain Today's Vitals   02/20/21 1118 02/20/21 1119 02/20/21 1327 02/20/21 1327  BP: (!) 144/63   112/60  Pulse: 68 66  (!) 47  Resp: 16 16  18   Temp: (!) 102 F (38.9 C)   99.6 F (37.6 C)  TempSrc: Oral   Oral  SpO2: 93% 96%  93%  Weight:      Height:      PainSc:   0-No pain 0-No pain    Isolation Precautions Airborne and Contact precautions  Medications Medications  nirmatrelvir/ritonavir EUA (renal dosing) (PAXLOVID) 2 tablet (2 tablets Oral Given 02/20/21 0902)  guaiFENesin-dextromethorphan (ROBITUSSIN DM) 100-10 MG/5ML syrup 10 mL (has no administration in time range)  acetaminophen (TYLENOL) tablet 650 mg (650 mg Oral Given 02/20/21 1138)    Or  acetaminophen (TYLENOL) suppository 650 mg ( Rectal See Alternative 02/20/21 1138)  cholecalciferol (VITAMIN D3) tablet 1,000  Units (1,000 Units Oral Given 02/20/21 0903)  HYDROcodone-acetaminophen (NORCO) 7.5-325 MG per tablet 1 tablet (has no administration in time range)  amLODipine (NORVASC) tablet 5 mg (5 mg Oral Given 02/20/21 0903)  insulin aspart (novoLOG) injection 0-9 Units (0 Units Subcutaneous Not Given 02/20/21 1139)  acetaminophen (TYLENOL) tablet 650 mg (650 mg Oral Given 02/19/21 0651)  potassium chloride SA (KLOR-CON  M) CR tablet 40 mEq (40 mEq Oral Given 02/19/21 0850)  potassium chloride 10 mEq in 100 mL IVPB (0 mEq Intravenous Stopped 02/19/21 1136)  magnesium sulfate IVPB 1 g 100 mL (0 g Intravenous Stopped 02/19/21 1213)  sodium chloride 0.9 % bolus 500 mL (0 mLs Intravenous Stopped 02/19/21 1149)  potassium chloride SA (KLOR-CON M) CR tablet 40 mEq (40 mEq Oral Given 02/20/21 1058)    Mobility walks High fall risk      R Recommendations: See Admitting Provider Note  Report given to:

## 2021-02-20 NOTE — Evaluation (Signed)
Physical Therapy Evaluation Patient Details Name: Christopher Tran MRN: [id number] DOB: 08-07-1925 Today's Date: 02/20/2021  History of Present Illness  87 y.o. male with medical history significant of aortic stenosis, degenerative joint disease, plantar fasciitis, GERD, hypertension who is coming to the emergency department due to generalized weakness associated with sore throat and body aches.  Pt admitted 02/19/21 with Covid-19.  Clinical Impression  Pt admitted with above diagnosis.  Pt currently with functional limitations due to the deficits listed below (see PT Problem List). Pt will benefit from skilled PT to increase their independence and safety with mobility to allow discharge to the venue listed below.  Pt assisted with standing and taking a couple steps up HOB.  Pt pleasantly surprised of his ability to stand. Pt likely able to ambulate in room however fatigued quickly and returned to bed.  Son present and answering most questions however plans for pt to return home with spouse (who also has Covid).  Discussed mobility with RN who will attempt to encourage pt to remain OOB later this evening if tolerated.       Recommendations for follow up therapy are one component of a multi-disciplinary discharge planning process, led by the attending physician.  Recommendations may be updated based on patient status, additional functional criteria and insurance authorization.  Follow Up Recommendations Home health PT    Assistance Recommended at Discharge Intermittent Supervision/Assistance  Functional Status Assessment Patient has had a recent decline in their functional status and demonstrates the ability to make significant improvements in function in a reasonable and predictable amount of time.  Equipment Recommendations  None recommended by PT    Recommendations for Other Services       Precautions / Restrictions Precautions Precautions: Fall Restrictions Weight Bearing  Restrictions: No      Mobility  Bed Mobility Overal bed mobility: Needs Assistance Bed Mobility: Supine to Sit;Sit to Supine     Supine to sit: Min guard Sit to supine: Min guard   General bed mobility comments: increased time and effort, assist for lines only    Transfers Overall transfer level: Needs assistance Equipment used: None Transfers: Sit to/from Stand Sit to Stand: Min guard           General transfer comment: min/guard for safety, pt also able to take a few steps up HOB, declined further mobility at this time due to fatigue, SPO2 99% on room air    Ambulation/Gait                  Stairs            Wheelchair Mobility    Modified Rankin (Stroke Patients Only)       Balance Overall balance assessment: Mild deficits observed, not formally tested                                           Pertinent Vitals/Pain Pain Assessment: No/denies pain    Home Living Family/patient expects to be discharged to:: Private residence Living Arrangements: Spouse/significant other Available Help at Discharge: Family Type of Home: House Home Access: Stairs to enter   Secretary/administrator of Steps: 1   Home Layout: One level Home Equipment: Agricultural consultant (2 wheels)      Prior Function Prior Level of Function : Independent/Modified Independent  Hand Dominance        Extremity/Trunk Assessment        Lower Extremity Assessment Lower Extremity Assessment: Generalized weakness       Communication   Communication: HOH  Cognition Arousal/Alertness: Awake/alert Behavior During Therapy: Flat affect Overall Cognitive Status: Difficult to assess                                 General Comments: pt appears overall WFL however son mostly talking for pt        General Comments      Exercises     Assessment/Plan    PT Assessment Patient needs continued PT services  PT  Problem List Decreased mobility;Decreased activity tolerance;Decreased strength       PT Treatment Interventions Gait training;DME instruction;Therapeutic exercise;Functional mobility training;Therapeutic activities;Patient/family education;Balance training    PT Goals (Current goals can be found in the Care Plan section)  Acute Rehab PT Goals PT Goal Formulation: With patient/family Time For Goal Achievement: 03/06/21 Potential to Achieve Goals: Good    Frequency Min 3X/week   Barriers to discharge        Co-evaluation               AM-PAC PT "6 Clicks" Mobility  Outcome Measure Help needed turning from your back to your side while in a flat bed without using bedrails?: A Little Help needed moving from lying on your back to sitting on the side of a flat bed without using bedrails?: A Little Help needed moving to and from a bed to a chair (including a wheelchair)?: A Little Help needed standing up from a chair using your arms (e.g., wheelchair or bedside chair)?: A Little Help needed to walk in hospital room?: A Little Help needed climbing 3-5 steps with a railing? : A Lot 6 Click Score: 17    End of Session Equipment Utilized During Treatment: Gait belt Activity Tolerance: Patient tolerated treatment well Patient left: in bed;with call bell/phone within reach;with bed alarm set;with family/visitor present Nurse Communication: Mobility status PT Visit Diagnosis: Difficulty in walking, not elsewhere classified (R26.2);Muscle weakness (generalized) (M62.81)    Time: 7672-0947 PT Time Calculation (min) (ACUTE ONLY): 21 min   Charges:   PT Evaluation $PT Eval Low Complexity: 1 Low        Kati PT, DPT Acute Rehabilitation Services Pager: [phone] Office: [phone]   [name] 02/20/2021, 4:05 PM

## 2021-02-20 NOTE — Assessment & Plan Note (Signed)
Secondary to hypokalemia. Currently being replaced. Will recheck QTC tomorrow.

## 2021-02-20 NOTE — Assessment & Plan Note (Signed)
On Norco.  We will continue.

## 2021-02-20 NOTE — ED Notes (Signed)
Pt in bed, pt had large soft bm, cleaned pt and changed bedding.

## 2021-02-20 NOTE — Assessment & Plan Note (Signed)
In the setting of infection. Will monitor.

## 2021-02-20 NOTE — Assessment & Plan Note (Addendum)
Severe. Replace aggressively.

## 2021-02-20 NOTE — Progress Notes (Signed)
[facility] Progress Note  Patient: Christopher Tran    DDU:[id number]  DOA: 02/19/2021    Date of Service: the patient was seen and examined on 02/20/2021  Brief hospital course: No notes on file  Assessment and Plan: * COVID-19 virus infection CRP mildly elevated.  Chest x-ray shows mild inflammatory changes. BNP minimally elevated as well. No evidence of DVT for now. Not hypoxic right now. Continue Paxilovid.   Hypokalemia Severe. Replacing aggressively. On recheck potassium level still remains normal low.  Will replace again.  Elevated bilirubin In the setting of infection. Will monitor.  Diastolic dysfunction Chronic.  Volume status stable.  Monitor.  Essential hypertension, benign Blood pressure elevated. Continue Norvasc.  Prolonged QT interval Secondary to hypokalemia. Currently being replaced. Will recheck QTC tomorrow.  Impaired glucose tolerance Hemoglobin A1c minimally elevated. Will discontinue the sliding scale.  Chronic pain syndrome On Norco.  We will continue.    Body mass index is 24 kg/m.        Subjective: No nausea no vomiting no fever no chills.  Ongoing complaint of fatigue and tiredness.  Objective: Vital signs were reviewed and unremarkable.  Exam: General: Appear in mild distress, no Rash; Oral Mucosa Clear, moist. no Abnormal Neck Mass Or lumps, Conjunctiva normal  Cardiovascular: S1 and S2 Present, no Murmur, Respiratory: increased respiratory effort, Bilateral Air entry present and bilateral  Crackles, no wheezes Abdomen: Bowel Sound present, Soft and no tenderness Extremities: trace Pedal edema Neurology: alert and oriented to time, place, and person affect appropriate. no new focal deficit Gait not checked due to patient safety concerns     Data Reviewed: My review of labs, imaging, notes and other tests is significant for     severe hypokalemia  Disposition:  Status is: Inpatient  Remains inpatient  appropriate because: Requires aggressive potassium replacement as well as ongoing therapy for COVID-19 and monitoring.  Family Communication: Son at bedside.  DVT Prophylaxis: SCDs Start: 02/19/21 1416   Time spent: 35 minutes.   Author: Lynden Oxford  02/20/2021 9:06 PM  To reach On-call, see care teams to locate the attending and reach out via [email or web address]. Between 7PM-7AM, please contact night-coverage If you still have difficulty reaching the attending provider, please page the [facility] (Director on Call) for [facility] on amion for assistance.

## 2021-02-21 ENCOUNTER — Inpatient Hospital Stay (HOSPITAL_COMMUNITY): Payer: Medicare Other

## 2021-02-21 LAB — CBC WITH DIFFERENTIAL/PLATELET
Abs Immature Granulocytes: 0.02 10*3/uL (ref 0.00–0.07)
Basophils Absolute: 0 10*3/uL (ref 0.0–0.1)
Basophils Relative: 0 %
Eosinophils Absolute: 0 10*3/uL (ref 0.0–0.5)
Eosinophils Relative: 0 %
HCT: 34.1 % — ABNORMAL LOW (ref 39.0–52.0)
Hemoglobin: 12.1 g/dL — ABNORMAL LOW (ref 13.0–17.0)
Immature Granulocytes: 0 %
Lymphocytes Relative: 13 %
Lymphs Abs: 0.8 10*3/uL (ref 0.7–4.0)
MCH: 31.6 pg (ref 26.0–34.0)
MCHC: 35.5 g/dL (ref 30.0–36.0)
MCV: 89 fL (ref 80.0–100.0)
Monocytes Absolute: 0.7 10*3/uL (ref 0.1–1.0)
Monocytes Relative: 11 %
Neutro Abs: 4.5 10*3/uL (ref 1.7–7.7)
Neutrophils Relative %: 76 %
Platelets: 125 10*3/uL — ABNORMAL LOW (ref 150–400)
RBC: 3.83 MIL/uL — ABNORMAL LOW (ref 4.22–5.81)
RDW: 13.5 % (ref 11.5–15.5)
WBC: 6 10*3/uL (ref 4.0–10.5)
nRBC: 0 % (ref 0.0–0.2)

## 2021-02-21 LAB — COMPREHENSIVE METABOLIC PANEL
ALT: 35 U/L (ref 0–44)
AST: 56 U/L — ABNORMAL HIGH (ref 15–41)
Albumin: 3.4 g/dL — ABNORMAL LOW (ref 3.5–5.0)
Alkaline Phosphatase: 67 U/L (ref 38–126)
Anion gap: 9 (ref 5–15)
BUN: 14 mg/dL (ref 8–23)
CO2: 25 mmol/L (ref 22–32)
Calcium: 8.1 mg/dL — ABNORMAL LOW (ref 8.9–10.3)
Chloride: 102 mmol/L (ref 98–111)
Creatinine, Ser: 0.98 mg/dL (ref 0.61–1.24)
GFR, Estimated: >60 mL/min (ref >60)
Glucose, Bld: 110 mg/dL — ABNORMAL HIGH (ref 70–99)
Potassium: 3.7 mmol/L (ref 3.5–5.1)
Sodium: 136 mmol/L (ref 135–145)
Total Bilirubin: 1.4 mg/dL — ABNORMAL HIGH (ref 0.3–1.2)
Total Protein: 5.7 g/dL — ABNORMAL LOW (ref 6.5–8.1)

## 2021-02-21 LAB — C-REACTIVE PROTEIN: CRP: 3.6 mg/dL — ABNORMAL HIGH (ref <1.0)

## 2021-02-21 LAB — MAGNESIUM: Magnesium: 1.7 mg/dL (ref 1.7–2.4)

## 2021-02-21 MED ORDER — LOPERAMIDE HCL 2 MG PO CAPS
2.0000 mg | ORAL_CAPSULE | Freq: Once | ORAL | Status: AC
  Administered 2021-02-21: 21:00:00 2 mg via ORAL
  Filled 2021-02-21: qty 1

## 2021-02-21 MED ORDER — SACCHAROMYCES BOULARDII 250 MG PO CAPS
250.0000 mg | ORAL_CAPSULE | Freq: Two times a day (BID) | ORAL | Status: DC
  Administered 2021-02-21 – 2021-02-25 (×8): 250 mg via ORAL
  Filled 2021-02-21 (×8): qty 1

## 2021-02-21 NOTE — Hospital Course (Addendum)
Christopher Tran is a 87 y.o. male with PMH AS, DJD, Planter fasciitis, GERD, HTN who presented to the hospital with generalized weakness and body aches. On work-up he was found to be positive for COVID-19.  He was not hypoxic. He was started on Paxlovid on admission. See below for further problem-based plan.

## 2021-02-21 NOTE — TOC Progression Note (Signed)
Transition of Care [facility]) - Progression Note    Patient Details  Name: Christopher Tran MRN: [id number] Date of Birth: [birth year]  Transition of Care [facility]) CM/SW Contact  Geni Bers, RN Phone Number: 02/21/2021, 1:00 PM  Clinical Narrative:    Pt will discharge home with family. A call was made to pt's son Onalee Hua, left VM will continue to call for discharge needs.    Expected Discharge Plan: Home w Home Health Services    Expected Discharge Plan and Services Expected Discharge Plan: Home w Home Health Services       Living arrangements for the past 2 months: Single Family Home                                       Social Determinants of Health (SDOH) Interventions    Readmission Risk Interventions No flowsheet data found.

## 2021-02-21 NOTE — Progress Notes (Signed)
[facility] Progress Note  Patient: Christopher Tran    GHW:[id number]  DOA: 02/19/2021    Date of Service: the patient was seen and examined on 02/21/2021  Brief hospital course: Christopher Tran is a 87 y.o. male with medical history significant of aortic stenosis, degenerative joint disease, plantar fasciitis, GERD, hypertension who is coming to the emergency department due to generalized weakness associated with sore throat and body aches Found to have COVID-19 infection.   Assessment and Plan: * COVID-19 virus infection CRP mildly elevated.  Chest x-ray shows mild inflammatory changes. BNP minimally elevated as well. No evidence of DVT for now. Not hypoxic right now.  Remains intermittently febrile. Continue Paxilovid.   Hypokalemia Severe. Replace aggressively.  Elevated bilirubin In the setting of infection. Will monitor.  Diastolic dysfunction Chronic.  Volume status stable.  Monitor.  Essential hypertension, benign Blood pressure elevated. Continue Norvasc.  Prolonged QT interval Secondary to hypokalemia. Currently being replaced. Will recheck QTC tomorrow.  Impaired glucose tolerance Hemoglobin A1c minimally elevated. Will discontinue the sliding scale.  Chronic pain syndrome On Norco.  We will continue.   Body mass index is 24 kg/m.        Subjective: Continues to have cough.  Some shortness of breath.  No nausea or vomiting.  Ongoing fatigue.  No dizziness or lightheadedness.  No diarrhea.  Objective: Remains febrile as well as tachycardic.  Exam: General: Appear in mild distress, no Rash; Oral Mucosa Clear, moist. no Abnormal Neck Mass Or lumps, Conjunctiva normal  Cardiovascular: S1 and S2 Present, no Murmur, Respiratory: increased respiratory effort, Bilateral Air entry present and bilateral  Crackles, no wheezes Abdomen: Bowel Sound present, Soft and no tenderness Extremities: trace Pedal edema Neurology: alert and oriented to  time, place, and person affect appropriate. no new focal deficit Gait not checked due to patient safety concerns    Data Reviewed: Worsening of CRP.  Other labs stable.  Disposition:  Status is: Inpatient  Remains inpatient appropriate because: Ongoing worsening of inflammatory markers and fever.  Need close monitoring for further worsening. at risk for severe decompensation if not monitored closely.  Family Communication: None at bedside.  Discussed with son at bedside on 12/19.  DVT Prophylaxis: SCDs Start: 02/19/21 1416   Time spent: 35 minutes.   Author: Lynden Oxford  02/21/2021 10:28 PM  To reach On-call, see care teams to locate the attending and reach out via [email or web address]. Between 7PM-7AM, please contact night-coverage If you still have difficulty reaching the attending provider, please page the [facility] (Director on Call) for [facility] on amion for assistance.

## 2021-02-22 DIAGNOSIS — R5381 Other malaise: Secondary | ICD-10-CM

## 2021-02-22 LAB — CBC WITH DIFFERENTIAL/PLATELET
Abs Immature Granulocytes: 0.06 10*3/uL (ref 0.00–0.07)
Basophils Absolute: 0 10*3/uL (ref 0.0–0.1)
Basophils Relative: 0 %
Eosinophils Absolute: 0 10*3/uL (ref 0.0–0.5)
Eosinophils Relative: 0 %
HCT: 36 % — ABNORMAL LOW (ref 39.0–52.0)
Hemoglobin: 12.3 g/dL — ABNORMAL LOW (ref 13.0–17.0)
Immature Granulocytes: 1 %
Lymphocytes Relative: 9 %
Lymphs Abs: 0.9 10*3/uL (ref 0.7–4.0)
MCH: 31.3 pg (ref 26.0–34.0)
MCHC: 34.2 g/dL (ref 30.0–36.0)
MCV: 91.6 fL (ref 80.0–100.0)
Monocytes Absolute: 0.7 10*3/uL (ref 0.1–1.0)
Monocytes Relative: 7 %
Neutro Abs: 8.3 10*3/uL — ABNORMAL HIGH (ref 1.7–7.7)
Neutrophils Relative %: 83 %
Platelets: 125 10*3/uL — ABNORMAL LOW (ref 150–400)
RBC: 3.93 MIL/uL — ABNORMAL LOW (ref 4.22–5.81)
RDW: 13.4 % (ref 11.5–15.5)
WBC: 10 10*3/uL (ref 4.0–10.5)
nRBC: 0 % (ref 0.0–0.2)

## 2021-02-22 LAB — COMPREHENSIVE METABOLIC PANEL WITH GFR
ALT: 32 U/L (ref 0–44)
AST: 47 U/L — ABNORMAL HIGH (ref 15–41)
Albumin: 3.2 g/dL — ABNORMAL LOW (ref 3.5–5.0)
Alkaline Phosphatase: 59 U/L (ref 38–126)
Anion gap: 8 (ref 5–15)
BUN: 19 mg/dL (ref 8–23)
CO2: 25 mmol/L (ref 22–32)
Calcium: 8 mg/dL — ABNORMAL LOW (ref 8.9–10.3)
Chloride: 100 mmol/L (ref 98–111)
Creatinine, Ser: 1.03 mg/dL (ref 0.61–1.24)
GFR, Estimated: >60 mL/min
Glucose, Bld: 131 mg/dL — ABNORMAL HIGH (ref 70–99)
Potassium: 3.1 mmol/L — ABNORMAL LOW (ref 3.5–5.1)
Sodium: 133 mmol/L — ABNORMAL LOW (ref 135–145)
Total Bilirubin: 1.2 mg/dL (ref 0.3–1.2)
Total Protein: 5.5 g/dL — ABNORMAL LOW (ref 6.5–8.1)

## 2021-02-22 LAB — MAGNESIUM: Magnesium: 1.8 mg/dL (ref 1.7–2.4)

## 2021-02-22 LAB — C-REACTIVE PROTEIN: CRP: 10.1 mg/dL — ABNORMAL HIGH (ref <1.0)

## 2021-02-22 MED ORDER — ENOXAPARIN SODIUM 40 MG/0.4ML IJ SOSY
40.0000 mg | PREFILLED_SYRINGE | INTRAMUSCULAR | Status: DC
  Administered 2021-02-22 – 2021-02-24 (×3): 40 mg via SUBCUTANEOUS
  Filled 2021-02-22 (×3): qty 0.4

## 2021-02-22 MED ORDER — POTASSIUM CHLORIDE CRYS ER 20 MEQ PO TBCR
40.0000 meq | EXTENDED_RELEASE_TABLET | ORAL | Status: AC
  Administered 2021-02-22 (×2): 40 meq via ORAL
  Filled 2021-02-22 (×2): qty 2

## 2021-02-22 NOTE — Assessment & Plan Note (Signed)
-   Replete as needed 

## 2021-02-22 NOTE — Progress Notes (Signed)
Progress Note    Christopher Tran   XIP:[id number]  DOB: 03/21/1925  DOA: 02/19/2021     2 PCP: Corwin Levins, MD  Initial CC: weakness  Hospital Course: Christopher Tran is a 87 y.o. male with PMH AS, DJD, Planter fasciitis, GERD, HTN who presented to the hospital with generalized weakness and body aches. On work-up he was found to be positive for COVID-19.  He was not hypoxic. He was started on Paxlovid on admission.  Interval History:  No events overnight. Resting in bed comfortable and in no distress. He had no concerns when seen this am. He was asking when he would be able to go home.   Assessment & Plan: * COVID-19 virus infection - no hypoxia on admission - continue trending inflammatory markers while hospitalized - continue Paxlovid - medically stable for discharge  Physical deconditioning - likely due to advanced age and covid infection - follow up PT/OT eval's - difficulty with discharge appears to be family concern for adequate help at home with patient due to sick family members; will continue in hospital tonight and re-discuss with social work in am regarding ability for d/c home   Prolonged QT interval - monitor Mg and K - repeat EKG in am  Hypokalemia - Replete as needed  Chronic diastolic CHF (congestive heart failure) (HCC) - last echo: EF 65-70%, Gr II DD - moderate to severe AS; per cardiology patient to have repeat echo 6 mths from July 2022 echo   Prediabetes - A1c 5.8% - continue diet control   Chronic pain syndrome - Continue Norco for now but hold for any worsening sedation -Database reviewed, last fill on 02/07/2021  Essential hypertension, benign - Continue Norvasc.  Elevated bilirubin-resolved as of 02/22/2021 - Presumed due to acute illness on admission - No signs of obstruction on LFTs - Bili has normalized    Old records reviewed in assessment of this patient  Antimicrobials: Paxlovid 12/18 >> current  DVT prophylaxis:  Lovenox  Code Status:   Code Status: Full Code  Disposition Plan:  Home with HH Status is: Inpt  Objective: Blood pressure 118/64, pulse 63, temperature 99.4 F (37.4 C), temperature source Oral, resp. rate 18, height 5\' 7"  (1.702 m), weight 69.5 kg, SpO2 95 %.  Examination:  Physical Exam Constitutional:      General: He is not in acute distress.    Appearance: Normal appearance.  HENT:     Head: Normocephalic and atraumatic.     Mouth/Throat:     Mouth: Mucous membranes are moist.  Eyes:     Extraocular Movements: Extraocular movements intact.  Cardiovascular:     Rate and Rhythm: Normal rate and regular rhythm.     Heart sounds: Normal heart sounds.  Pulmonary:     Effort: Pulmonary effort is normal. No respiratory distress.     Breath sounds: Normal breath sounds. No wheezing.  Abdominal:     General: Bowel sounds are normal. There is no distension.     Palpations: Abdomen is soft.     Tenderness: There is no abdominal tenderness.  Musculoskeletal:        General: Normal range of motion.     Cervical back: Normal range of motion and neck supple.  Skin:    General: Skin is warm and dry.  Neurological:     General: No focal deficit present.     Mental Status: He is alert.  Psychiatric:        Mood and Affect:  Mood normal.        Behavior: Behavior normal.     Consultants:    Procedures:    Data Reviewed: I have personally reviewed labs and imaging studies    LOS: 2 days  Time spent: Greater than 50% of the 35 minute visit was spent in counseling/coordination of care for the patient as laid out in the A&P.   Lewie Chamber, MD [facility] 02/22/2021, 6:23 PM

## 2021-02-22 NOTE — TOC Progression Note (Addendum)
Transition of Care [facility]) - Progression Note    Patient Details  Name: Christopher Tran MRN: [id number] Date of Birth: 1925/10/10  [facility]) CM/SW Contact  Geni Bers, RN Phone Number: 02/22/2021, 2:01 PM  Clinical Narrative:    Spoke with pt's son Onalee Hua this AM concerning discharge plans, HH/SNF. Onalee Hua states that everyone have COVID at pt's home.  Asked Onalee Hua if CM could speak with Lurena Joiner granddaughter who lives with pt. No one is able to care for pt at present time. Lurena Joiner  asked if pt could go to SNF for [place]. Will work pt up for SNF after PT Eval..   Expected Discharge Plan: Home w Home Health Services    Expected Discharge Plan and Services Expected Discharge Plan: Home w Home Health Services       Living arrangements for the past 2 months: Single Family Home                                       Social Determinants of Health (SDOH) Interventions    Readmission Risk Interventions No flowsheet data found.

## 2021-02-22 NOTE — Assessment & Plan Note (Signed)
-   A1c 5.8% - continue diet control

## 2021-02-22 NOTE — Plan of Care (Signed)
Discussed with family and patient plan of care for the evening, pain management and clustering acre with some teach back displayed.   Problem: Education: Goal: Knowledge of General Education information will improve Description: Including pain rating scale, medication(s)/side effects and non-pharmacologic comfort measures Outcome: Progressing   Problem: Health Behavior/Discharge Planning: Goal: Ability to manage health-related needs will improve Outcome: Progressing

## 2021-02-22 NOTE — Assessment & Plan Note (Addendum)
-   no hypoxia on admission - Paxlovid completed on 12/22 - medically stable for discharge -Overall, he remained stable and improved, able to work with therapy and ambulate safely with assistance.  He plans to discharge home with family and home health on board

## 2021-02-22 NOTE — Assessment & Plan Note (Addendum)
-   monitor Mg and K - improved, on 12/22

## 2021-02-22 NOTE — Assessment & Plan Note (Addendum)
-   Continue Norco for now but hold for any worsening sedation -Database reviewed, last fill on 02/07/2021

## 2021-02-22 NOTE — Assessment & Plan Note (Signed)
Continue Norvasc

## 2021-02-22 NOTE — NC FL2 (Signed)
[place] MEDICAID FL2 LEVEL OF CARE SCREENING TOOL     IDENTIFICATION  Patient Name: Christopher Tran Birthdate: April 14, 1925 Sex: male Admission Date (Current Location): 02/19/2021  [facility] and [place] Number:  Producer, television/film/video and Address:  [facility],  [address] [place], [place] [address]      Provider Number: [id number]  Attending Physician Name and Address:  Lewie Chamber, MD  Relative Name and Phone Number:  Kreig, Parson   [phone]    Current Level of Care: Hospital Recommended Level of Care: Skilled Nursing Facility Prior Approval Number:    Date Approved/Denied:   PASRR Number: [id number]  Discharge Plan: SNF    Current Diagnoses: Patient Active Problem List   Diagnosis Date Noted   COVID-19 virus infection 02/20/2021   Prolonged QT interval 02/19/2021   Vitamin D deficiency 06/11/2020   Lower back pain 03/20/2018   Left inguinal hernia 03/20/2018   Pneumonia 06/11/2017   Hypokalemia 06/11/2017   Elevated bilirubin 06/11/2017   Mild mitral valve stenosis 04/24/2017   Diastolic dysfunction 04/24/2017   Abnormal CXR 04/24/2017   Right hip pain 10/24/2016   Low back pain 10/24/2016   Moderate aortic stenosis 04/26/2016   Impaired glucose tolerance 04/06/2014   Essential hypertension, benign 10/02/2013   Chronic pain syndrome 10/02/2013   GERD (gastroesophageal reflux disease) 10/02/2013   Spondylosis 01/06/2007   FASCIITIS, PLANTAR 01/06/2007    Orientation RESPIRATION BLADDER Height & Weight     Self, Situation, Place  Normal Incontinent Weight: 69.5 kg Height:  5\' 7"  (170.2 cm)  BEHAVIORAL SYMPTOMS/MOOD NEUROLOGICAL BOWEL NUTRITION STATUS      Incontinent Diet (Regular)  AMBULATORY STATUS COMMUNICATION OF NEEDS Skin   Extensive Assist Verbally Other (Comment) (Ecchymosis Bilateral Arms)                       Personal Care Assistance Level of Assistance  Bathing, Feeding, Dressing Bathing Assistance: Limited  assistance Feeding assistance: Independent Dressing Assistance: Limited assistance     Functional Limitations Info  Sight, Hearing, Speech Sight Info: Adequate Hearing Info: Adequate Speech Info: Adequate    SPECIAL CARE FACTORS FREQUENCY  PT (By licensed PT), OT (By licensed OT)     PT Frequency: x5 week OT Frequency: x5 week            Contractures Contractures Info: Not present    Additional Factors Info  Code Status, Allergies Code Status Info: FULL Allergies Info: No Known Allergies           Current Medications (02/22/2021):  This is the current hospital active medication list Current Facility-Administered Medications  Medication Dose Route Frequency Provider Last Rate Last Admin   acetaminophen (TYLENOL) tablet 650 mg  650 mg Oral Q6H PRN 02/24/2021, MD   650 mg at 02/21/21 1813   Or   acetaminophen (TYLENOL) suppository 650 mg  650 mg Rectal Q6H PRN 02/23/21, MD       amLODipine (NORVASC) tablet 5 mg  5 mg Oral Daily Bobette Mo, MD   5 mg at 02/22/21 1243   cholecalciferol (VITAMIN D3) tablet 1,000 Units  1,000 Units Oral Daily 02/24/21, MD   1,000 Units at 02/22/21 1244   guaiFENesin-dextromethorphan (ROBITUSSIN DM) 100-10 MG/5ML syrup 10 mL  10 mL Oral Q4H PRN 02/24/21, MD       HYDROcodone-acetaminophen [facility]) 7.5-325 MG per tablet 1 tablet  1 tablet Oral BID PRN 07-03-1976,  MD   1 tablet at 02/20/21 1753   nirmatrelvir/ritonavir EUA (renal dosing) (PAXLOVID) 2 tablet  2 tablet Oral BID Bobette Mo, MD   2 tablet at 02/22/21 1245   potassium chloride SA (KLOR-CON M) CR tablet 40 mEq  40 mEq Oral Carlis Abbott, MD   40 mEq at 02/22/21 1244   saccharomyces boulardii (FLORASTOR) capsule 250 mg  250 mg Oral BID Luiz Iron, NP   250 mg at 02/22/21 1244     Discharge Medications: Please see discharge summary for a list of discharge medications.  Relevant Imaging  Results:  Relevant Lab Results:   Additional Information SS#[id number]  Geni Bers, RN

## 2021-02-22 NOTE — Progress Notes (Signed)
Physical Therapy Treatment Patient Details Name: Christopher Tran MRN: [id number] DOB: [birth year] Today's Date: 02/22/2021   History of Present Illness 87 y.o. male with medical history significant of aortic stenosis, degenerative joint disease, plantar fasciitis, GERD, hypertension who is coming to the emergency department due to generalized weakness associated with sore throat and body aches.  Pt admitted 02/19/21 with Covid-19.    PT Comments    The patient  requires min guard/min assistance for bed mobility and ambulation with Rw. Son came  to visit, reports wants to hire someone to assist patient and wife, who is very sick at home. Patient is improved in mobility. HR 110, SPO2 95% on RA post ambulation. Continue PT/ mobility.  Recommendations for follow up therapy are one component of a multi-disciplinary discharge planning process, led by the attending physician.  Recommendations may be updated based on patient status, additional functional criteria and insurance authorization.  Follow Up Recommendations  Home health PT     Assistance Recommended at Discharge Frequent or constant Supervision/Assistance  Equipment Recommendations  None recommended by PT    Recommendations for Other Services       Precautions / Restrictions Precautions Precautions: Fall     Mobility  Bed Mobility   Bed Mobility: Supine to Sit;Sit to Supine     Supine to sit: Min assist Sit to supine: Min assist   General bed mobility comments: increased time and effort, assist for  trunk but believe patient was "playing" with therapist and asking for assistance.    Transfers Overall transfer level: Needs assistance Equipment used: Rolling walker (2 wheels) Transfers: Sit to/from Stand Sit to Stand: Min guard           General transfer comment: from bed and toilet, used rail    Ambulation/Gait Ambulation/Gait assistance: Min guard Gait Distance (Feet): 25 Feet (then 15') Assistive device:  Rolling walker (2 wheels) Gait Pattern/deviations: Step-through pattern;Trunk flexed Gait velocity: decr     General Gait Details: tends to push Rw ahead, cues to stay inside.   Stairs             Wheelchair Mobility    Modified Rankin (Stroke Patients Only)       Balance Overall balance assessment: Mild deficits observed, not formally tested                                          Cognition Arousal/Alertness: Awake/alert Behavior During Therapy: Flat affect Overall Cognitive Status: Difficult to assess                                 General Comments: pt appears overall WFL to place and time        Exercises      General Comments        Pertinent Vitals/Pain Pain Assessment: No/denies pain    Home Living                          Prior Function            PT Goals (current goals can now be found in the care plan section) Progress towards PT goals: Progressing toward goals    Frequency    Min 3X/week      PT Plan Current plan remains appropriate  Co-evaluation              AM-PAC PT "6 Clicks" Mobility   Outcome Measure  Help needed turning from your back to your side while in a flat bed without using bedrails?: A Little Help needed moving from lying on your back to sitting on the side of a flat bed without using bedrails?: A Little Help needed moving to and from a bed to a chair (including a wheelchair)?: A Little Help needed standing up from a chair using your arms (e.g., wheelchair or bedside chair)?: A Little Help needed to walk in hospital room?: A Little Help needed climbing 3-5 steps with a railing? : A Lot 6 Click Score: 17    End of Session Equipment Utilized During Treatment: Gait belt Activity Tolerance: Patient tolerated treatment well Patient left: in bed;with call bell/phone within reach;with bed alarm set;with family/visitor present Nurse Communication: Mobility  status PT Visit Diagnosis: Difficulty in walking, not elsewhere classified (R26.2);Muscle weakness (generalized) (M62.81)     Time: 9407-6808 PT Time Calculation (min) (ACUTE ONLY): 26 min  Charges:  $Gait Training: 8-22 mins $Self Care/Home Management: 8-22                     Blanchard Kelch PT Acute Rehabilitation Services Pager [phone] Office [phone]    Rada Hay 02/22/2021, 4:36 PM

## 2021-02-22 NOTE — Assessment & Plan Note (Addendum)
-   likely due to advanced age and covid infection -Home health arranged at discharge

## 2021-02-22 NOTE — Assessment & Plan Note (Signed)
-   Presumed due to acute illness on admission - No signs of obstruction on LFTs - Bili has normalized

## 2021-02-22 NOTE — Assessment & Plan Note (Addendum)
-   last echo: EF 65-70%, Gr II DD - moderate to severe AS; per cardiology patient to have repeat echo 6 mths from July 2022 echo

## 2021-02-23 LAB — COMPREHENSIVE METABOLIC PANEL
ALT: 27 U/L (ref 0–44)
AST: 33 U/L (ref 15–41)
Albumin: 3.2 g/dL — ABNORMAL LOW (ref 3.5–5.0)
Alkaline Phosphatase: 54 U/L (ref 38–126)
Anion gap: 6 (ref 5–15)
BUN: 20 mg/dL (ref 8–23)
CO2: 24 mmol/L (ref 22–32)
Calcium: 7.8 mg/dL — ABNORMAL LOW (ref 8.9–10.3)
Chloride: 103 mmol/L (ref 98–111)
Creatinine, Ser: 0.99 mg/dL (ref 0.61–1.24)
GFR, Estimated: >60 mL/min (ref >60)
Glucose, Bld: 111 mg/dL — ABNORMAL HIGH (ref 70–99)
Potassium: 3.4 mmol/L — ABNORMAL LOW (ref 3.5–5.1)
Sodium: 133 mmol/L — ABNORMAL LOW (ref 135–145)
Total Bilirubin: 1.4 mg/dL — ABNORMAL HIGH (ref 0.3–1.2)
Total Protein: 5.7 g/dL — ABNORMAL LOW (ref 6.5–8.1)

## 2021-02-23 LAB — CBC WITH DIFFERENTIAL/PLATELET
Abs Immature Granulocytes: 0.03 10*3/uL (ref 0.00–0.07)
Basophils Absolute: 0 10*3/uL (ref 0.0–0.1)
Basophils Relative: 0 %
Eosinophils Absolute: 0.1 10*3/uL (ref 0.0–0.5)
Eosinophils Relative: 1 %
HCT: 34.9 % — ABNORMAL LOW (ref 39.0–52.0)
Hemoglobin: 12 g/dL — ABNORMAL LOW (ref 13.0–17.0)
Immature Granulocytes: 0 %
Lymphocytes Relative: 14 %
Lymphs Abs: 1 10*3/uL (ref 0.7–4.0)
MCH: 31.6 pg (ref 26.0–34.0)
MCHC: 34.4 g/dL (ref 30.0–36.0)
MCV: 91.8 fL (ref 80.0–100.0)
Monocytes Absolute: 0.6 10*3/uL (ref 0.1–1.0)
Monocytes Relative: 8 %
Neutro Abs: 5.6 10*3/uL (ref 1.7–7.7)
Neutrophils Relative %: 77 %
Platelets: 117 10*3/uL — ABNORMAL LOW (ref 150–400)
RBC: 3.8 MIL/uL — ABNORMAL LOW (ref 4.22–5.81)
RDW: 13.8 % (ref 11.5–15.5)
WBC: 7.3 10*3/uL (ref 4.0–10.5)
nRBC: 0 % (ref 0.0–0.2)

## 2021-02-23 LAB — LACTATE DEHYDROGENASE: LDH: 165 U/L (ref 98–192)

## 2021-02-23 LAB — MAGNESIUM: Magnesium: 1.8 mg/dL (ref 1.7–2.4)

## 2021-02-23 LAB — C-REACTIVE PROTEIN: CRP: 11.4 mg/dL — ABNORMAL HIGH (ref <1.0)

## 2021-02-23 LAB — D-DIMER, QUANTITATIVE: D-Dimer, Quant: 0.89 ug/mL-FEU — ABNORMAL HIGH (ref 0.00–0.50)

## 2021-02-23 MED ORDER — LOPERAMIDE HCL 2 MG PO CAPS
4.0000 mg | ORAL_CAPSULE | ORAL | Status: DC | PRN
  Administered 2021-02-23 – 2021-02-24 (×3): 4 mg via ORAL
  Filled 2021-02-23 (×3): qty 2

## 2021-02-23 MED ORDER — ADULT MULTIVITAMIN W/MINERALS CH
1.0000 | ORAL_TABLET | Freq: Every day | ORAL | Status: DC
Start: 2021-02-23 — End: 2021-02-25
  Administered 2021-02-23 – 2021-02-25 (×3): 1 via ORAL
  Filled 2021-02-23 (×3): qty 1

## 2021-02-23 MED ORDER — POTASSIUM CHLORIDE 20 MEQ PO PACK
20.0000 meq | PACK | Freq: Once | ORAL | Status: AC
  Administered 2021-02-23: 10:00:00 20 meq via ORAL
  Filled 2021-02-23: qty 1

## 2021-02-23 NOTE — Progress Notes (Signed)
Chaplain tried to call son, Onalee Hua, by phone.  Chaplain left a voicemail.  Chaplain is trying to get clarity on need for healthcare POA.  Completing the Advanced Directive, Healthcare POA documents are not usually completed when a patient has COVID or precautions due to use of witnesses.  Chaplain will gain clarity about need for document.    02/23/21 0900  Clinical Encounter Type  Visited With Patient not available;Family  Visit Type Initial

## 2021-02-23 NOTE — Progress Notes (Signed)
Chaplain was able to get in touch with Dhiren's son.  Chaplain explained because Onalee Hua is next of kin and in agreement with his mother concerning Maika's care that there is no need to complete the Healthcare POA, Advanced Directive.  Onalee Hua revealed that he lives across the street from his parents and takes care of them.    Onalee Hua also shared about his mother having COVID and his daughter recently having surgery on her ACL.  Daughter is currently looking after mom while she also recovers.  Chaplain affirmed how much they're family has been holding lately.    Chaplain offered listening and support.     02/23/21 1000  Clinical Encounter Type  Visited With Family  Visit Type Initial  Stress Factors  Family Stress Factors Major life changes;Exhausted

## 2021-02-23 NOTE — TOC Progression Note (Signed)
Transition of Care [facility]) - Progression Note    Patient Details  Name: ENNIS HEAVNER MRN: [id number] Date of Birth: [birth year]  Transition of [facility] CM/SW Contact  Geni Bers, RN Phone Number: 02/23/2021, 3:08 PM  Clinical Narrative:     Spoke with Lurena Joiner granddaughter concerning HH. [facility]. Referral given to in house rep.    Expected Discharge Plan: Home w Home Health Services    Expected Discharge Plan and Services Expected Discharge Plan: Home w Home Health Services       Living arrangements for the past 2 months: Single Family Home                                       Social Determinants of Health (SDOH) Interventions    Readmission Risk Interventions No flowsheet data found.

## 2021-02-23 NOTE — Progress Notes (Signed)
More Progress Note    Christopher Tran   KDX:[id number]  DOB: [birth year]  DOA: 02/19/2021     3 PCP: Corwin Levins, MD  Initial CC: weakness  Hospital Course: Christopher Tran is a 87 y.o. male with PMH AS, DJD, Planter fasciitis, GERD, HTN who presented to the hospital with generalized weakness and body aches. On work-up he was found to be positive for COVID-19.  He was not hypoxic. He was started on Paxlovid on admission.  Interval History:  No events overnight.  Patient's son present bedside this morning.  Update given and questions answered.  Patient had no concerns himself.  He was resting in bed comfortably; his goal is still for going home and does not appear to want to go to rehab.  Assessment & Plan: * COVID-19 virus infection- (present on admission) - no hypoxia on admission - continue trending inflammatory markers while hospitalized - continue Paxlovid - medically stable for discharge  Physical deconditioning - likely due to advanced age and covid infection - follow up PT/OT eval's - difficulty with discharge appears to be family concern for adequate help at home with patient due to sick family members; hopeful for d/c by Saturday with family; in the meantime we have sent out FL2 in case of need for SNF  Prolonged QT interval-resolved as of 02/23/2021, (present on admission) - monitor Mg and K - improved, on 12/22  Hypokalemia- (present on admission) - Replete as needed  Chronic diastolic CHF (congestive heart failure) (HCC) - last echo: EF 65-70%, Gr II DD - moderate to severe AS; per cardiology patient to have repeat echo 6 mths from July 2022 echo   Prediabetes- (present on admission) - A1c 5.8% - continue diet control   Chronic pain syndrome- (present on admission) - Continue Norco for now but hold for any worsening sedation -Database reviewed, last fill on 02/07/2021  Essential hypertension, benign- (present on admission) - Continue  Norvasc.  Elevated bilirubin-resolved as of 02/22/2021, (present on admission) - Presumed due to acute illness on admission - No signs of obstruction on LFTs - Bili has normalized    Old records reviewed in assessment of this patient  Antimicrobials: Paxlovid 12/18 >> current  DVT prophylaxis: Lovenox  Code Status:   Code Status: Full Code  Disposition Plan:  Home with HH - tentative Sat Status is: Inpt  Objective: Blood pressure (!) 129/58, pulse 62, temperature 98.6 F (37 C), temperature source Oral, resp. rate 18, height 5\' 7"  (1.702 m), weight 69.5 kg, SpO2 99 %.  Examination:  Physical Exam Constitutional:      General: He is not in acute distress.    Appearance: Normal appearance.  HENT:     Head: Normocephalic and atraumatic.     Mouth/Throat:     Mouth: Mucous membranes are moist.  Eyes:     Extraocular Movements: Extraocular movements intact.  Cardiovascular:     Rate and Rhythm: Normal rate and regular rhythm.     Heart sounds: Normal heart sounds.  Pulmonary:     Effort: Pulmonary effort is normal. No respiratory distress.     Breath sounds: Normal breath sounds. No wheezing.  Abdominal:     General: Bowel sounds are normal. There is no distension.     Palpations: Abdomen is soft.     Tenderness: There is no abdominal tenderness.  Musculoskeletal:        General: Normal range of motion.     Cervical back: Normal range of  motion and neck supple.  Skin:    General: Skin is warm and dry.  Neurological:     General: No focal deficit present.     Mental Status: He is alert.  Psychiatric:        Mood and Affect: Mood normal.        Behavior: Behavior normal.     Consultants:    Procedures:    Data Reviewed: I have personally reviewed labs and imaging studies    LOS: 3 days  Time spent: Greater than 50% of the 35 minute visit was spent in counseling/coordination of care for the patient as laid out in the A&P.   Christopher Chamber, MD [facility] 02/23/2021, 3:02 PM

## 2021-02-23 NOTE — Consult Note (Signed)
Spiritual Consult placed for son  Jaeden, Messer   [phone]  To be power of attorney paperwork.  He requested the service yesterday

## 2021-02-24 LAB — COMPREHENSIVE METABOLIC PANEL
ALT: 24 U/L (ref 0–44)
AST: 26 U/L (ref 15–41)
Albumin: 3.3 g/dL — ABNORMAL LOW (ref 3.5–5.0)
Alkaline Phosphatase: 55 U/L (ref 38–126)
Anion gap: 8 (ref 5–15)
BUN: 17 mg/dL (ref 8–23)
CO2: 27 mmol/L (ref 22–32)
Calcium: 8.3 mg/dL — ABNORMAL LOW (ref 8.9–10.3)
Chloride: 103 mmol/L (ref 98–111)
Creatinine, Ser: 0.87 mg/dL (ref 0.61–1.24)
GFR, Estimated: >60 mL/min (ref >60)
Glucose, Bld: 101 mg/dL — ABNORMAL HIGH (ref 70–99)
Potassium: 3.7 mmol/L (ref 3.5–5.1)
Sodium: 138 mmol/L (ref 135–145)
Total Bilirubin: 1.2 mg/dL (ref 0.3–1.2)
Total Protein: 5.9 g/dL — ABNORMAL LOW (ref 6.5–8.1)

## 2021-02-24 LAB — CBC WITH DIFFERENTIAL/PLATELET
Abs Immature Granulocytes: 0.05 10*3/uL (ref 0.00–0.07)
Basophils Absolute: 0 10*3/uL (ref 0.0–0.1)
Basophils Relative: 0 %
Eosinophils Absolute: 0.2 10*3/uL (ref 0.0–0.5)
Eosinophils Relative: 2 %
HCT: 33.7 % — ABNORMAL LOW (ref 39.0–52.0)
Hemoglobin: 11.4 g/dL — ABNORMAL LOW (ref 13.0–17.0)
Immature Granulocytes: 1 %
Lymphocytes Relative: 11 %
Lymphs Abs: 0.9 10*3/uL (ref 0.7–4.0)
MCH: 31 pg (ref 26.0–34.0)
MCHC: 33.8 g/dL (ref 30.0–36.0)
MCV: 91.6 fL (ref 80.0–100.0)
Monocytes Absolute: 0.5 10*3/uL (ref 0.1–1.0)
Monocytes Relative: 7 %
Neutro Abs: 6.2 10*3/uL (ref 1.7–7.7)
Neutrophils Relative %: 79 %
Platelets: 152 10*3/uL (ref 150–400)
RBC: 3.68 MIL/uL — ABNORMAL LOW (ref 4.22–5.81)
RDW: 13.5 % (ref 11.5–15.5)
WBC: 7.9 10*3/uL (ref 4.0–10.5)
nRBC: 0 % (ref 0.0–0.2)

## 2021-02-24 LAB — C-REACTIVE PROTEIN: CRP: 7.8 mg/dL — ABNORMAL HIGH (ref <1.0)

## 2021-02-24 LAB — LACTATE DEHYDROGENASE: LDH: 163 U/L (ref 98–192)

## 2021-02-24 LAB — MAGNESIUM: Magnesium: 1.8 mg/dL (ref 1.7–2.4)

## 2021-02-24 LAB — D-DIMER, QUANTITATIVE: D-Dimer, Quant: 0.67 ug/mL-FEU — ABNORMAL HIGH (ref 0.00–0.50)

## 2021-02-24 NOTE — Progress Notes (Signed)
Physical Therapy Treatment Patient Details Name: Christopher Tran MRN: [id number] DOB: 03/17/1925 Today's Date: 02/24/2021   History of Present Illness 87 y.o. male with medical history significant of aortic stenosis, degenerative joint disease, plantar fasciitis, GERD, hypertension who is coming to the emergency department due to generalized weakness associated with sore throat and body aches.  Pt admitted 02/19/21 with Covid-19.    PT Comments    Pt ambulated improved distance in hallway and used bathroom prior to return to bed.    Recommendations for follow up therapy are one component of a multi-disciplinary discharge planning process, led by the attending physician.  Recommendations may be updated based on patient status, additional functional criteria and insurance authorization.  Follow Up Recommendations  Home health PT     Assistance Recommended at Discharge Frequent or constant Supervision/Assistance  Equipment Recommendations  None recommended by PT    Recommendations for Other Services       Precautions / Restrictions Precautions Precautions: Fall     Mobility  Bed Mobility Overal bed mobility: Needs Assistance Bed Mobility: Supine to Sit;Sit to Supine     Supine to sit: Min guard Sit to supine: Min guard        Transfers Overall transfer level: Needs assistance Equipment used: Rolling walker (2 wheels) Transfers: Sit to/from Stand Sit to Stand: Min guard           General transfer comment: cues for hand placement    Ambulation/Gait Ambulation/Gait assistance: Min guard Gait Distance (Feet): 120 Feet Assistive device: Rolling walker (2 wheels) Gait Pattern/deviations: Step-through pattern;Trunk flexed       General Gait Details: tends to keep RW too far forward, cues for posture and RW positioning, SpO2 down to 86% on room air however pt gripping with pulse ox finger; improved to 92% room air upon return to room with relaxed monitor  finger   Stairs             Wheelchair Mobility    Modified Rankin (Stroke Patients Only)       Balance Overall balance assessment: Mild deficits observed, not formally tested                                          Cognition Arousal/Alertness: Awake/alert Behavior During Therapy: Flat affect Overall Cognitive Status: Within Functional Limits for tasks assessed                                 General Comments: pt not very talkative but appropriate        Exercises      General Comments        Pertinent Vitals/Pain Pain Assessment: No/denies pain    Home Living                          Prior Function            PT Goals (current goals can now be found in the care plan section) Progress towards PT goals: Progressing toward goals    Frequency    Min 3X/week      PT Plan Current plan remains appropriate    Co-evaluation              AM-PAC PT "6 Clicks" Mobility   Outcome Measure  Help  needed turning from your back to your side while in a flat bed without using bedrails?: A Little Help needed moving from lying on your back to sitting on the side of a flat bed without using bedrails?: A Little Help needed moving to and from a bed to a chair (including a wheelchair)?: A Little Help needed standing up from a chair using your arms (e.g., wheelchair or bedside chair)?: A Little Help needed to walk in hospital room?: A Little Help needed climbing 3-5 steps with a railing? : A Lot 6 Click Score: 17    End of Session Equipment Utilized During Treatment: Gait belt Activity Tolerance: Patient tolerated treatment well Patient left: in bed;with call bell/phone within reach Nurse Communication: Mobility status PT Visit Diagnosis: Difficulty in walking, not elsewhere classified (R26.2);Muscle weakness (generalized) (M62.81)     Time: 4765-4650 PT Time Calculation (min) (ACUTE ONLY): 21 min  Charges:   $Gait Training: 8-22 mins                    Thomasene Mohair PT, DPT Acute Rehabilitation Services Pager: [phone] Office: [phone]    [name] 02/24/2021, 3:47 PM

## 2021-02-24 NOTE — Plan of Care (Signed)
Discussed with patient in front of family plan of care this evening, pain management and bedtime medications with some teach back displayed.  Family going home overnight and will take patient to bathroom before bed, when he wakes up and morning prior to shift ending.  Family given my direct number [phone] to call tonight to check on the patient.   Family requesting another COVID test if at all possible prior to discharge since his with and granddaughter have tested negative at this time.  Patient states he really wants to be home for the holidays.  Problem: Education: Goal: Knowledge of General Education information will improve Description: Including pain rating scale, medication(s)/side effects and non-pharmacologic comfort measures Outcome: Progressing   Problem: Health Behavior/Discharge Planning: Goal: Ability to manage health-related needs will improve Outcome: Progressing

## 2021-02-24 NOTE — Care Management Important Message (Signed)
Important Message  Patient Details IM Letter placed in Patients room. Name: Christopher Tran MRN: [id number] Date of Birth: Jan 07, 1926   Medicare Important Message Given:  Yes     Caren Macadam 02/24/2021, 1:41 PM

## 2021-02-24 NOTE — Progress Notes (Signed)
More Progress Note    Christopher Tran   [id number]  DOB: [birth year]  DOA: 02/19/2021     4 PCP: Corwin Levins, MD  Initial CC: weakness  Hospital Course: Christopher Tran is a 87 y.o. male with PMH AS, DJD, Planter fasciitis, GERD, HTN who presented to the hospital with generalized weakness and body aches. On work-up he was found to be positive for COVID-19.  He was not hypoxic. He was started on Paxlovid on admission.  Interval History:  No events overnight.  Patient's son present bedside this morning.  Appetite remains relatively good. He's been using the walker fairly well also getting around the room and to the bathroom and back.  Possible d/c home on Saturday.   Assessment & Plan: * COVID-19 virus infection- (present on admission) - no hypoxia on admission - continue trending inflammatory markers while hospitalized - Paxlovid completed on 12/22 - medically stable for discharge  Physical deconditioning - likely due to advanced age and covid infection - follow up PT/OT eval's - difficulty with discharge appears to be family concern for adequate help at home with patient due to sick family members; hopeful for d/c by Saturday with family; in the meantime we have sent out FL2 in case of need for SNF  Prolonged QT interval-resolved as of 02/23/2021, (present on admission) - monitor Mg and K - improved, on 12/22  Hypokalemia- (present on admission) - Replete as needed  Chronic diastolic CHF (congestive heart failure) (HCC) - last echo: EF 65-70%, Gr II DD - moderate to severe AS; per cardiology patient to have repeat echo 6 mths from July 2022 echo   Prediabetes- (present on admission) - A1c 5.8% - continue diet control   Chronic pain syndrome- (present on admission) - Continue Norco for now but hold for any worsening sedation -Database reviewed, last fill on 02/07/2021  Essential hypertension, benign- (present on admission) - Continue  Norvasc.  Elevated bilirubin-resolved as of 02/22/2021, (present on admission) - Presumed due to acute illness on admission - No signs of obstruction on LFTs - Bili has normalized    Old records reviewed in assessment of this patient  Antimicrobials: Paxlovid 12/18 >> 12/22  DVT prophylaxis: Lovenox  Code Status:   Code Status: Full Code  Disposition Plan:  Home with HH - tentative Sat Status is: Inpt  Objective: Blood pressure 129/65, pulse (!) 57, temperature 97.8 F (36.6 C), temperature source Oral, resp. rate 18, height 5\' 7"  (1.702 m), weight 69.5 kg, SpO2 96 %.  Examination:  Physical Exam Constitutional:      General: He is not in acute distress.    Appearance: Normal appearance.  HENT:     Head: Normocephalic and atraumatic.     Mouth/Throat:     Mouth: Mucous membranes are moist.  Eyes:     Extraocular Movements: Extraocular movements intact.  Cardiovascular:     Rate and Rhythm: Normal rate and regular rhythm.     Heart sounds: Normal heart sounds.  Pulmonary:     Effort: Pulmonary effort is normal. No respiratory distress.     Breath sounds: Normal breath sounds. No wheezing.  Abdominal:     General: Bowel sounds are normal. There is no distension.     Palpations: Abdomen is soft.     Tenderness: There is no abdominal tenderness.  Musculoskeletal:        General: Normal range of motion.     Cervical back: Normal range of motion and neck supple.  Skin:    General: Skin is warm and dry.  Neurological:     General: No focal deficit present.     Mental Status: He is alert.  Psychiatric:        Mood and Affect: Mood normal.        Behavior: Behavior normal.     Consultants:    Procedures:    Data Reviewed: I have personally reviewed labs and imaging studies    LOS: 4 days  Time spent: Greater than 50% of the 35 minute visit was spent in counseling/coordination of care for the patient as laid out in the A&P.   Lewie Chamber, MD [facility] 02/24/2021, 5:28 PM

## 2021-02-25 DIAGNOSIS — I5032 Chronic diastolic (congestive) heart failure: Secondary | ICD-10-CM

## 2021-02-25 LAB — RESP PANEL BY RT-PCR (FLU A&B, COVID) ARPGX2
Influenza A by PCR: NEGATIVE
Influenza B by PCR: NEGATIVE
SARS Coronavirus 2 by RT PCR: POSITIVE — AB

## 2021-02-25 NOTE — Discharge Summary (Signed)
Physician Discharge Summary   Christopher Tran UJW:[id number] DOB: 28-Jul-1925 DOA: 02/19/2021  PCP: Corwin Levins, MD  Admit date: 02/19/2021 Discharge date: 02/25/2021   Admitted From: Home Disposition:  Home with HH Discharging physician: Lewie Chamber, MD  Recommendations for Outpatient Follow-up:  Due to repeat echo in Jan 2023 to re-evaluate aortic stenosis   Home Health: PT/OT Equipment/Devices:   Discharge Condition: stable CODE STATUS: Full Diet recommendation:  Diet Orders (From admission, onward)     Start     Ordered   02/25/21 0000  Diet general        02/25/21 1033   02/20/21 0804  Diet regular Room service appropriate? Yes; Fluid consistency: Thin  Diet effective now       Question Answer Comment  Room service appropriate? Yes   Fluid consistency: Thin      02/20/21 0803            Hospital Course: Christopher Tran is a 87 y.o. male with PMH AS, DJD, Planter fasciitis, GERD, HTN who presented to the hospital with generalized weakness and body aches. On work-up he was found to be positive for COVID-19.  He was not hypoxic. He was started on Paxlovid on admission. See below for further problem-based plan.  * COVID-19 virus infection- (present on admission) - no hypoxia on admission - Paxlovid completed on 12/22 - medically stable for discharge -Overall, he remained stable and improved, able to work with therapy and ambulate safely with assistance.  He plans to discharge home with family and home health on board  Physical deconditioning - likely due to advanced age and covid infection -Home health arranged at discharge  Prolonged QT interval-resolved as of 02/23/2021, (present on admission) - monitor Mg and K - improved, on 12/22  Hypokalemia- (present on admission) - Replete as needed  Chronic diastolic CHF (congestive heart failure) (HCC) - last echo: EF 65-70%, Gr II DD - moderate to severe AS; per cardiology patient to have  repeat echo 6 mths from July 2022 echo   Prediabetes- (present on admission) - A1c 5.8% - continue diet control   Chronic pain syndrome- (present on admission) - Continue Norco for now but hold for any worsening sedation -Database reviewed, last fill on 02/07/2021  Essential hypertension, benign- (present on admission) - Continue Norvasc.  Elevated bilirubin-resolved as of 02/22/2021, (present on admission) - Presumed due to acute illness on admission - No signs of obstruction on LFTs - Bili has normalized    The patient's chronic medical conditions were treated accordingly per the patient's home medication regimen except as noted.  On day of discharge, patient was felt deemed stable for discharge. Patient/family member advised to call PCP or come back to ER if needed.   Principal Diagnosis: COVID-19 virus infection  Discharge Diagnoses: Principal Problem:   COVID-19 virus infection Active Problems:   Physical deconditioning   Essential hypertension, benign   Chronic pain syndrome   Prediabetes   Chronic diastolic CHF (congestive heart failure) (HCC)   Hypokalemia   Discharge Instructions     Diet general   Complete by: As directed    Increase activity slowly   Complete by: As directed       Allergies as of 02/25/2021   No Known Allergies      Medication List     STOP taking these medications    Fish Oil 1000 MG Caps       TAKE these medications    amLODipine 5  MG tablet Commonly known as: NORVASC TAKE 1 TABLET (5 MG TOTAL) BY MOUTH DAILY.   cholecalciferol 1000 units tablet Commonly known as: VITAMIN D Take 1,000 Units by mouth daily.   HYDROcodone-acetaminophen 7.5-325 MG tablet Commonly known as: NORCO Take 1 tablet by mouth 2 (two) times daily as needed for moderate pain or severe pain (chronic pain).        Follow-up Information     [facility] Follow up.   Specialty: Home Health Services Contact information: [address] [place] [address] [phone]                No Known Allergies  Consultations:   Discharge Exam: BP 124/61 (BP Location: Right Arm)    Pulse (!) 52    Temp 98.1 F (36.7 C) (Oral)    Resp 18    Ht 5\' 7"  (1.702 m)    Wt 69.5 kg    SpO2 100%    BMI 24.00 kg/m  Physical Exam Constitutional:      General: He is not in acute distress.    Appearance: Normal appearance.  HENT:     Head: Normocephalic and atraumatic.     Mouth/Throat:     Mouth: Mucous membranes are moist.  Eyes:     Extraocular Movements: Extraocular movements intact.  Cardiovascular:     Rate and Rhythm: Normal rate and regular rhythm.     Heart sounds: Normal heart sounds.  Pulmonary:     Effort: Pulmonary effort is normal. No respiratory distress.     Breath sounds: Normal breath sounds. No wheezing.  Abdominal:     General: Bowel sounds are normal. There is no distension.     Palpations: Abdomen is soft.     Tenderness: There is no abdominal tenderness.  Musculoskeletal:        General: Normal range of motion.     Cervical back: Normal range of motion and neck supple.  Skin:    General: Skin is warm and dry.  Neurological:     General: No focal deficit present.     Mental Status: He is alert.  Psychiatric:        Mood and Affect: Mood normal.        Behavior: Behavior normal.     The results of significant diagnostics from this hospitalization (including imaging, microbiology, ancillary and laboratory) are listed below for reference.   Microbiology: Recent Results (from the past 240 hour(s))  Resp Panel by RT-PCR (Flu A&B, Covid) Nasopharyngeal Swab     Status: Abnormal   Collection Time: 02/19/21  5:12 AM   Specimen: Nasopharyngeal Swab; Nasopharyngeal(NP) swabs in vial transport medium  Result Value Ref Range Status   SARS Coronavirus 2 by RT PCR POSITIVE (A) NEGATIVE Final    Comment: (NOTE) SARS-CoV-2 target nucleic acids are DETECTED.  The SARS-CoV-2 RNA is  generally detectable in upper respiratory specimens during the acute phase of infection. Positive results are indicative of the presence of the identified virus, but do not rule out bacterial infection or co-infection with other pathogens not detected by the test. Clinical correlation with patient history and other diagnostic information is necessary to determine patient infection status. The expected result is Negative.  Fact Sheet for Patients: 02/21/21  Fact Sheet for Healthcare Providers: BloggerCourse.com  This test is not yet approved or cleared by the [place] FDA and  has been authorized for detection and/or diagnosis of SARS-CoV-2 by FDA under an Emergency  Use Authorization (EUA).  This EUA will remain in effect (meaning this test can be used) for the duration of  the COVID-19 declaration under Section 564(b)(1) of the A ct, 21 U.S.C. section 360bbb-3(b)(1), unless the authorization is terminated or revoked sooner.     Influenza A by PCR NEGATIVE NEGATIVE Final   Influenza B by PCR NEGATIVE NEGATIVE Final    Comment: (NOTE) The Xpert Xpress SARS-CoV-2/FLU/RSV plus assay is intended as an aid in the diagnosis of influenza from Nasopharyngeal swab specimens and should not be used as a sole basis for treatment. Nasal washings and aspirates are unacceptable for Xpert Xpress SARS-CoV-2/FLU/RSV testing.  Fact Sheet for Patients: BloggerCourse.com  Fact Sheet for Healthcare Providers: SeriousBroker.it  This test is not yet approved or cleared by the [place] FDA and has been authorized for detection and/or diagnosis of SARS-CoV-2 by FDA under an Emergency Use Authorization (EUA). This EUA will remain in effect (meaning this test can be used) for the duration of the COVID-19 declaration under Section 564(b)(1) of the Act, 21 U.S.C. section 360bbb-3(b)(1),  unless the authorization is terminated or revoked.  Performed at [facility], [address] [place], [place] [address]   Resp Panel by RT-PCR (Flu A&B, Covid) Nasopharyngeal Swab     Status: Abnormal   Collection Time: 02/25/21  9:34 AM   Specimen: Nasopharyngeal Swab; Nasopharyngeal(NP) swabs in vial transport medium  Result Value Ref Range Status   SARS Coronavirus 2 by RT PCR POSITIVE (A) NEGATIVE Final    Comment: (NOTE) SARS-CoV-2 target nucleic acids are DETECTED.  The SARS-CoV-2 RNA is generally detectable in upper respiratory specimens during the acute phase of infection. Positive results are indicative of the presence of the identified virus, but do not rule out bacterial infection or co-infection with other pathogens not detected by the test. Clinical correlation with patient history and other diagnostic information is necessary to determine patient infection status. The expected result is Negative.  Fact Sheet for Patients: BloggerCourse.com  Fact Sheet for Healthcare Providers: [employer]iousBroker.it  This test is not yet approved or cleared by the [place] FDA and  has been authorized for detection and/or diagnosis of SARS-CoV-2 by FDA under an Emergency Use Authorization (EUA).  This EUA will remain in effect (meaning this test can be used) for the duration of  the COVID-19 declaration under Section 564(b)(1) of the A ct, 21 U.S.C. section 360bbb-3(b)(1), unless the authorization is terminated or revoked sooner.     Influenza A by PCR NEGATIVE NEGATIVE Final   Influenza B by PCR NEGATIVE NEGATIVE Final    Comment: (NOTE) The Xpert Xpress SARS-CoV-2/FLU/RSV plus assay is intended as an aid in the diagnosis of influenza from Nasopharyngeal swab specimens and should not be used as a sole basis for treatment. Nasal washings and aspirates are unacceptable for Xpert Xpress  SARS-CoV-2/FLU/RSV testing.  Fact Sheet for Patients: BloggerCourse.com  Fact Sheet for Healthcare Providers: SeriousBroker.it  This test is not yet approved or cleared by the [place] FDA and has been authorized for detection and/or diagnosis of SARS-CoV-2 by FDA under an Emergency Use Authorization (EUA). This EUA will remain in effect (meaning this test can be used) for the duration of the COVID-19 declaration under Section 564(b)(1) of the Act, 21 U.S.C. section 360bbb-3(b)(1), unless the authorization is terminated or revoked.  Performed at [facility], [address] [place], [place] [address]      Labs: BNP (last 3 results) Recent Labs  02/20/21 0500  BNP 216.8*   Basic Metabolic Panel: Recent Labs  Lab 02/20/21 0500 02/20/21 1703 02/21/21 0409 02/22/21 0422 02/23/21 0353 02/24/21 0421  NA 137 137 136 133* 133* 138  K 2.8* 3.2* 3.7 3.1* 3.4* 3.7  CL 103 102 102 100 103 103  CO2 24 26 25 25 24 27   GLUCOSE 91 144* 110* 131* 111* 101*  BUN 13 14 14 19 20 17   CREATININE 0.97 1.10 0.98 1.03 0.99 0.87  CALCIUM 8.0* 8.4* 8.1* 8.0* 7.8* 8.3*  MG 1.8  --  1.7 1.8 1.8 1.8  PHOS 3.2  --   --   --   --   --    Liver Function Tests: Recent Labs  Lab 02/20/21 0500 02/21/21 0409 02/22/21 0422 02/23/21 0353 02/24/21 0421  AST 41 56* 47* 33 26  ALT 25 35 32 27 24  ALKPHOS 69 67 59 54 55  BILITOT 1.8* 1.4* 1.2 1.4* 1.2  PROT 5.7* 5.7* 5.5* 5.7* 5.9*  ALBUMIN 3.5 3.4* 3.2* 3.2* 3.3*   No results for input(s): LIPASE, AMYLASE in the last 168 hours. No results for input(s): AMMONIA in the last 168 hours. CBC: Recent Labs  Lab 02/20/21 0500 02/21/21 0409 02/22/21 0422 02/23/21 0353 02/24/21 0421  WBC 4.8 6.0 10.0 7.3 7.9  NEUTROABS 3.4 4.5 8.3* 5.6 6.2  HGB 11.3* 12.1* 12.3* 12.0* 11.4*  HCT 33.0* 34.1* 36.0* 34.9* 33.7*  MCV 90.9 89.0 91.6 91.8 91.6  PLT 133* 125* 125* 117*  152   Cardiac Enzymes: No results for input(s): CKTOTAL, CKMB, CKMBINDEX, TROPONINI in the last 168 hours. BNP: Invalid input(s): POCBNP CBG: Recent Labs  Lab 02/19/21 1703 02/20/21 0815  GLUCAP 110* 78   D-Dimer Recent Labs    02/23/21 0353 02/24/21 0421  DDIMER 0.89* 0.67*   Hgb A1c No results for input(s): HGBA1C in the last 72 hours. Lipid Profile No results for input(s): CHOL, HDL, LDLCALC, TRIG, CHOLHDL, LDLDIRECT in the last 72 hours. Thyroid function studies No results for input(s): TSH, T4TOTAL, T3FREE, THYROIDAB in the last 72 hours.  Invalid input(s): FREET3 Anemia work up No results for input(s): VITAMINB12, FOLATE, FERRITIN, TIBC, IRON, RETICCTPCT in the last 72 hours. Urinalysis    Component Value Date/Time   COLORURINE YELLOW 02/19/2021 0803   APPEARANCEUR CLEAR 02/19/2021 0803   LABSPEC 1.014 02/19/2021 0803   PHURINE 5.0 02/19/2021 0803   GLUCOSEU NEGATIVE 02/19/2021 0803   GLUCOSEU NEGATIVE 06/10/2020 1035   HGBUR MODERATE (A) 02/19/2021 0803   BILIRUBINUR NEGATIVE 02/19/2021 0803   KETONESUR 5 (A) 02/19/2021 0803   PROTEINUR NEGATIVE 02/19/2021 0803   UROBILINOGEN 1.0 06/10/2020 1035   NITRITE NEGATIVE 02/19/2021 0803   LEUKOCYTESUR NEGATIVE 02/19/2021 0803   Sepsis Labs Invalid input(s): PROCALCITONIN,  WBC,  LACTICIDVEN Microbiology Recent Results (from the past 240 hour(s))  Resp Panel by RT-PCR (Flu A&B, Covid) Nasopharyngeal Swab     Status: Abnormal   Collection Time: 02/19/21  5:12 AM   Specimen: Nasopharyngeal Swab; Nasopharyngeal(NP) swabs in vial transport medium  Result Value Ref Range Status   SARS Coronavirus 2 by RT PCR POSITIVE (A) NEGATIVE Final    Comment: (NOTE) SARS-CoV-2 target nucleic acids are DETECTED.  The SARS-CoV-2 RNA is generally detectable in upper respiratory specimens during the acute phase of infection. Positive results are indicative of the presence of the identified virus, but do not rule out bacterial  infection or co-infection with other pathogens not detected by the test. Clinical correlation with patient history  and other diagnostic information is necessary to determine patient infection status. The expected result is Negative.  Fact Sheet for Patients: BloggerCourse.com  Fact Sheet for Healthcare Providers: SeriousBroker.it  This test is not yet approved or cleared by the [place] FDA and  has been authorized for detection and/or diagnosis of SARS-CoV-2 by FDA under an Emergency Use Authorization (EUA).  This EUA will remain in effect (meaning this test can be used) for the duration of  the COVID-19 declaration under Section 564(b)(1) of the A ct, 21 U.S.C. section 360bbb-3(b)(1), unless the authorization is terminated or revoked sooner.     Influenza A by PCR NEGATIVE NEGATIVE Final   Influenza B by PCR NEGATIVE NEGATIVE Final    Comment: (NOTE) The Xpert Xpress SARS-CoV-2/FLU/RSV plus assay is intended as an aid in the diagnosis of influenza from Nasopharyngeal swab specimens and should not be used as a sole basis for treatment. Nasal washings and aspirates are unacceptable for Xpert Xpress SARS-CoV-2/FLU/RSV testing.  Fact Sheet for Patients: BloggerCourse.com  Fact Sheet for Healthcare Providers: SeriousBroker.it  This test is not yet approved or cleared by the [place] FDA and has been authorized for detection and/or diagnosis of SARS-CoV-2 by FDA under an Emergency Use Authorization (EUA). This EUA will remain in effect (meaning this test can be used) for the duration of the COVID-19 declaration under Section 564(b)(1) of the Act, 21 U.S.C. section 360bbb-3(b)(1), unless the authorization is terminated or revoked.  Performed at [facility], [address] [place], [place] [address]   Resp Panel by RT-PCR (Flu A&B, Covid)  Nasopharyngeal Swab     Status: Abnormal   Collection Time: 02/25/21  9:34 AM   Specimen: Nasopharyngeal Swab; Nasopharyngeal(NP) swabs in vial transport medium  Result Value Ref Range Status   SARS Coronavirus 2 by RT PCR POSITIVE (A) NEGATIVE Final    Comment: (NOTE) SARS-CoV-2 target nucleic acids are DETECTED.  The SARS-CoV-2 RNA is generally detectable in upper respiratory specimens during the acute phase of infection. Positive results are indicative of the presence of the identified virus, but do not rule out bacterial infection or co-infection with other pathogens not detected by the test. Clinical correlation with patient history and other diagnostic information is necessary to determine patient infection status. The expected result is Negative.  Fact Sheet for Patients: BloggerCourse.com  Fact Sheet for Healthcare Providers: [employer]iousBroker.it  This test is not yet approved or cleared by the [place] FDA and  has been authorized for detection and/or diagnosis of SARS-CoV-2 by FDA under an Emergency Use Authorization (EUA).  This EUA will remain in effect (meaning this test can be used) for the duration of  the COVID-19 declaration under Section 564(b)(1) of the A ct, 21 U.S.C. section 360bbb-3(b)(1), unless the authorization is terminated or revoked sooner.     Influenza A by PCR NEGATIVE NEGATIVE Final   Influenza B by PCR NEGATIVE NEGATIVE Final    Comment: (NOTE) The Xpert Xpress SARS-CoV-2/FLU/RSV plus assay is intended as an aid in the diagnosis of influenza from Nasopharyngeal swab specimens and should not be used as a sole basis for treatment. Nasal washings and aspirates are unacceptable for Xpert Xpress SARS-CoV-2/FLU/RSV testing.  Fact Sheet for Patients: BloggerCourse.com  Fact Sheet for Healthcare Providers: SeriousBroker.it  This test is not yet  approved or cleared by the [place] FDA and has been authorized for detection and/or diagnosis of SARS-CoV-2 by FDA under an Emergency Use Authorization (EUA). This EUA will remain in  effect (meaning this test can be used) for the duration of the COVID-19 declaration under Section 564(b)(1) of the Act, 21 U.S.C. section 360bbb-3(b)(1), unless the authorization is terminated or revoked.  Performed at [facility], [address], [place], [place] [address]     Procedures/Studies: DG Chest 2 View  Result Date: 02/19/2021 CLINICAL DATA:  Generalized weakness.  Fall today EXAM: CHEST - 2 VIEW COMPARISON:  06/11/2017 and earlier FINDINGS: Low volume chest with reticular opacity on both sides. There is no edema, air bronchogram, effusion, or pneumothorax. Borderline heart size with chronic aortic tortuosity. Postoperative distal right clavicle. IMPRESSION: Chronic lung disease.  No acute finding when compared to priors. Electronically Signed   By: Tiburcio Pea M.D.   On: 02/19/2021 04:59   DG CHEST PORT 1 VIEW  Result Date: 02/21/2021 CLINICAL DATA:  Shortness of breath. EXAM: PORTABLE CHEST 1 VIEW COMPARISON:  Chest x-ray 02/19/2021. FINDINGS: There is new patchy airspace disease in the left lower lobe. There is also minimal new patchy airspace disease in the right mid lung. Costophrenic angles are clear. There is no pneumothorax. The cardiomediastinal silhouette is stable. No acute fractures are seen. IMPRESSION: 1. New bilateral airspace disease, left greater than right, worrisome for multifocal pneumonia. Electronically Signed   By: Darliss Cheney M.D.   On: 02/21/2021 15:44     Time coordinating discharge: Over 30 minutes    Lewie Chamber, MD  [facility] 02/25/2021, 3:16 PM

## 2021-02-28 DIAGNOSIS — I110 Hypertensive heart disease with heart failure: Secondary | ICD-10-CM | POA: Diagnosis not present

## 2021-02-28 DIAGNOSIS — E876 Hypokalemia: Secondary | ICD-10-CM | POA: Diagnosis not present

## 2021-02-28 DIAGNOSIS — Z9181 History of falling: Secondary | ICD-10-CM | POA: Diagnosis not present

## 2021-02-28 DIAGNOSIS — U071 COVID-19: Secondary | ICD-10-CM | POA: Diagnosis not present

## 2021-02-28 DIAGNOSIS — G894 Chronic pain syndrome: Secondary | ICD-10-CM | POA: Diagnosis not present

## 2021-02-28 DIAGNOSIS — M47816 Spondylosis without myelopathy or radiculopathy, lumbar region: Secondary | ICD-10-CM | POA: Diagnosis not present

## 2021-02-28 DIAGNOSIS — I5032 Chronic diastolic (congestive) heart failure: Secondary | ICD-10-CM | POA: Diagnosis not present

## 2021-02-28 DIAGNOSIS — I350 Nonrheumatic aortic (valve) stenosis: Secondary | ICD-10-CM | POA: Diagnosis not present

## 2021-02-28 DIAGNOSIS — K219 Gastro-esophageal reflux disease without esophagitis: Secondary | ICD-10-CM | POA: Diagnosis not present

## 2021-02-28 DIAGNOSIS — Z87891 Personal history of nicotine dependence: Secondary | ICD-10-CM | POA: Diagnosis not present

## 2021-02-28 DIAGNOSIS — R7303 Prediabetes: Secondary | ICD-10-CM | POA: Diagnosis not present

## 2021-02-28 DIAGNOSIS — M722 Plantar fascial fibromatosis: Secondary | ICD-10-CM | POA: Diagnosis not present

## 2021-03-03 DIAGNOSIS — I110 Hypertensive heart disease with heart failure: Secondary | ICD-10-CM | POA: Diagnosis not present

## 2021-03-03 DIAGNOSIS — U071 COVID-19: Secondary | ICD-10-CM | POA: Diagnosis not present

## 2021-03-03 DIAGNOSIS — I5032 Chronic diastolic (congestive) heart failure: Secondary | ICD-10-CM | POA: Diagnosis not present

## 2021-03-03 DIAGNOSIS — M47816 Spondylosis without myelopathy or radiculopathy, lumbar region: Secondary | ICD-10-CM | POA: Diagnosis not present

## 2021-03-03 DIAGNOSIS — I350 Nonrheumatic aortic (valve) stenosis: Secondary | ICD-10-CM | POA: Diagnosis not present

## 2021-03-03 DIAGNOSIS — E876 Hypokalemia: Secondary | ICD-10-CM | POA: Diagnosis not present

## 2021-03-06 DIAGNOSIS — I350 Nonrheumatic aortic (valve) stenosis: Secondary | ICD-10-CM | POA: Diagnosis not present

## 2021-03-06 DIAGNOSIS — M47816 Spondylosis without myelopathy or radiculopathy, lumbar region: Secondary | ICD-10-CM | POA: Diagnosis not present

## 2021-03-06 DIAGNOSIS — U071 COVID-19: Secondary | ICD-10-CM | POA: Diagnosis not present

## 2021-03-06 DIAGNOSIS — I5032 Chronic diastolic (congestive) heart failure: Secondary | ICD-10-CM | POA: Diagnosis not present

## 2021-03-06 DIAGNOSIS — E876 Hypokalemia: Secondary | ICD-10-CM | POA: Diagnosis not present

## 2021-03-06 DIAGNOSIS — I110 Hypertensive heart disease with heart failure: Secondary | ICD-10-CM | POA: Diagnosis not present

## 2021-03-07 ENCOUNTER — Telehealth: Payer: Self-pay | Admitting: Internal Medicine

## 2021-03-07 NOTE — Telephone Encounter (Signed)
Connected to [facility] 1.2.2023.   Caller states, pt post hospital fall and covid 19 dx. Pt is much worse. Pt cant walk, swelling and edema in hands and feet. Stable vitals. Pt is not improving- asking for hospice order.Elevated HR in 100's. Screaming in pain when touched. Becoming incontinent.  Advised to call EMS

## 2021-03-08 ENCOUNTER — Telehealth: Payer: Self-pay

## 2021-03-08 DIAGNOSIS — G894 Chronic pain syndrome: Secondary | ICD-10-CM

## 2021-03-08 DIAGNOSIS — M545 Low back pain, unspecified: Secondary | ICD-10-CM

## 2021-03-08 MED ORDER — HYDROCODONE-ACETAMINOPHEN 7.5-325 MG PO TABS: 1.0000 | ORAL_TABLET | Freq: Two times a day (BID) | ORAL | 0 refills | Status: DC | PRN

## 2021-03-08 NOTE — Telephone Encounter (Signed)
Pt granddaughter calling in requesting a refill on HYDROcodone-acetaminophen (NORCO) 7.5-325 MG tablet on behalf of the pt.  LOV 12/12/20 Pharmacy [facility] #[id number] - [place]

## 2021-03-09 DIAGNOSIS — I5032 Chronic diastolic (congestive) heart failure: Secondary | ICD-10-CM | POA: Diagnosis not present

## 2021-03-09 DIAGNOSIS — I350 Nonrheumatic aortic (valve) stenosis: Secondary | ICD-10-CM | POA: Diagnosis not present

## 2021-03-09 DIAGNOSIS — I110 Hypertensive heart disease with heart failure: Secondary | ICD-10-CM | POA: Diagnosis not present

## 2021-03-09 DIAGNOSIS — M47816 Spondylosis without myelopathy or radiculopathy, lumbar region: Secondary | ICD-10-CM | POA: Diagnosis not present

## 2021-03-09 DIAGNOSIS — E876 Hypokalemia: Secondary | ICD-10-CM | POA: Diagnosis not present

## 2021-03-09 DIAGNOSIS — U071 COVID-19: Secondary | ICD-10-CM | POA: Diagnosis not present

## 2021-03-10 ENCOUNTER — Encounter: Payer: Self-pay | Admitting: Internal Medicine

## 2021-03-10 ENCOUNTER — Other Ambulatory Visit: Payer: Self-pay

## 2021-03-10 ENCOUNTER — Ambulatory Visit (INDEPENDENT_AMBULATORY_CARE_PROVIDER_SITE_OTHER): Payer: Medicare Other | Admitting: Internal Medicine

## 2021-03-10 VITALS — BP 130/62 | HR 67 | Temp 98.8°F | Ht 67.0 in | Wt 137.0 lb

## 2021-03-10 DIAGNOSIS — R5381 Other malaise: Secondary | ICD-10-CM

## 2021-03-10 DIAGNOSIS — I5032 Chronic diastolic (congestive) heart failure: Secondary | ICD-10-CM | POA: Diagnosis not present

## 2021-03-10 DIAGNOSIS — G894 Chronic pain syndrome: Secondary | ICD-10-CM

## 2021-03-10 DIAGNOSIS — R269 Unspecified abnormalities of gait and mobility: Secondary | ICD-10-CM | POA: Diagnosis not present

## 2021-03-10 DIAGNOSIS — I10 Essential (primary) hypertension: Secondary | ICD-10-CM | POA: Diagnosis not present

## 2021-03-10 DIAGNOSIS — E538 Deficiency of other specified B group vitamins: Secondary | ICD-10-CM

## 2021-03-10 DIAGNOSIS — E559 Vitamin D deficiency, unspecified: Secondary | ICD-10-CM

## 2021-03-10 DIAGNOSIS — R634 Abnormal weight loss: Secondary | ICD-10-CM

## 2021-03-10 DIAGNOSIS — M79641 Pain in right hand: Secondary | ICD-10-CM | POA: Diagnosis not present

## 2021-03-10 DIAGNOSIS — R7303 Prediabetes: Secondary | ICD-10-CM | POA: Diagnosis not present

## 2021-03-10 LAB — HEPATIC FUNCTION PANEL
ALT: 20 U/L (ref 0–53)
AST: 19 U/L (ref 0–37)
Albumin: 3.4 g/dL — ABNORMAL LOW (ref 3.5–5.2)
Alkaline Phosphatase: 62 U/L (ref 39–117)
Bilirubin, Direct: 0.2 mg/dL (ref 0.0–0.3)
Total Bilirubin: 0.7 mg/dL (ref 0.2–1.2)
Total Protein: 6.3 g/dL (ref 6.0–8.3)

## 2021-03-10 LAB — URINALYSIS, ROUTINE W REFLEX MICROSCOPIC
Bilirubin Urine: NEGATIVE
Hgb urine dipstick: NEGATIVE
Ketones, ur: NEGATIVE
Leukocytes,Ua: NEGATIVE
Nitrite: NEGATIVE
RBC / HPF: NONE SEEN (ref 0–?)
Specific Gravity, Urine: 1.02 (ref 1.000–1.030)
Total Protein, Urine: NEGATIVE
Urine Glucose: NEGATIVE
Urobilinogen, UA: 1 (ref 0.0–1.0)
pH: 6 (ref 5.0–8.0)

## 2021-03-10 LAB — CBC WITH DIFFERENTIAL/PLATELET
Basophils Absolute: 0.1 10*3/uL (ref 0.0–0.1)
Basophils Relative: 1.2 % (ref 0.0–3.0)
Eosinophils Absolute: 0.2 10*3/uL (ref 0.0–0.7)
Eosinophils Relative: 3.2 % (ref 0.0–5.0)
HCT: 33.9 % — ABNORMAL LOW (ref 39.0–52.0)
Hemoglobin: 11.2 g/dL — ABNORMAL LOW (ref 13.0–17.0)
Lymphocytes Relative: 12.9 % (ref 12.0–46.0)
Lymphs Abs: 1 10*3/uL (ref 0.7–4.0)
MCHC: 33 g/dL (ref 30.0–36.0)
MCV: 91.6 fl (ref 78.0–100.0)
Monocytes Absolute: 0.7 10*3/uL (ref 0.1–1.0)
Monocytes Relative: 9 % (ref 3.0–12.0)
Neutro Abs: 5.6 10*3/uL (ref 1.4–7.7)
Neutrophils Relative %: 73.7 % (ref 43.0–77.0)
Platelets: 374 10*3/uL (ref 150.0–400.0)
RBC: 3.7 Mil/uL — ABNORMAL LOW (ref 4.22–5.81)
RDW: 13.4 % (ref 11.5–15.5)
WBC: 7.6 10*3/uL (ref 4.0–10.5)

## 2021-03-10 LAB — BASIC METABOLIC PANEL
BUN: 24 mg/dL — ABNORMAL HIGH (ref 6–23)
CO2: 28 mEq/L (ref 19–32)
Calcium: 9.1 mg/dL (ref 8.4–10.5)
Chloride: 101 mEq/L (ref 96–112)
Creatinine, Ser: 0.67 mg/dL (ref 0.40–1.50)
GFR: 80.46 mL/min (ref >60.00)
Glucose, Bld: 102 mg/dL — ABNORMAL HIGH (ref 70–99)
Potassium: 3.7 mEq/L (ref 3.5–5.1)
Sodium: 139 mEq/L (ref 135–145)

## 2021-03-10 LAB — HEMOGLOBIN A1C: Hgb A1c MFr Bld: 6.2 % (ref 4.6–6.5)

## 2021-03-10 LAB — TSH: TSH: 2.47 u[IU]/mL (ref 0.35–5.50)

## 2021-03-10 LAB — VITAMIN D 25 HYDROXY (VIT D DEFICIENCY, FRACTURES): VITD: 23.9 ng/mL — ABNORMAL LOW (ref 30.00–100.00)

## 2021-03-10 LAB — VITAMIN B12: Vitamin B-12: >1550 pg/mL — ABNORMAL HIGH (ref 211–911)

## 2021-03-10 MED ORDER — PREDNISONE 10 MG PO TABS: ORAL_TABLET | ORAL | 3 refills | Status: DC

## 2021-03-10 MED ORDER — MEGESTROL ACETATE 40 MG PO TABS: 40.0000 mg | ORAL_TABLET | Freq: Every day | ORAL | 1 refills | Status: DC

## 2021-03-10 NOTE — Patient Instructions (Signed)
Please take all new medication as prescribed - the megace for appetite, and prednisone for gout recurring as needed  Please continue all other medications as before, and refills have been done if requested.  Please have the pharmacy call with any other refills you may need.  Please continue your efforts at being more active, low cholesterol diet, and weight control.  Please keep your appointments with your specialists as you may have planned  You will be contacted regarding the referral for: Home Health for RN, PT/OT, and aide  Please go to the LAB at the blood drawing area for the tests to be done  You will be contacted by phone if any changes need to be made immediately.  Otherwise, you will receive a letter about your results with an explanation, but please check with MyChart first.  Please remember to sign up for MyChart if you have not done so, as this will be important to you in the future with finding out test results, communicating by private email, and scheduling acute appointments online when needed.  Please make an Appointment to return in 2 weeks, or sooner if needed

## 2021-03-10 NOTE — Progress Notes (Signed)
Patient ID: Christopher Tran, male   DOB: 09/01/1925, 88 y.o.   MRN: [id number]        Chief Complaint: follow up with son regarding wt loss, low appetite, worsening gait and fall risk, chronic pain, pedal edema       HPI:  Christopher Tran is a 88 y.o. male here with above, Pt denies chest pain, increased sob or doe, wheezing, orthopnea, PND, palpitations, dizziness or syncope but has about 2 mo increased pedal edema right > left.  Has had lower appetite and recent wt loss from 160 to 137.  Denies worsening reflux, abd pain, dysphagia, n/v, bowel change or blood.  Denies worsening depressive symptoms, suicidal ideation, or panic.  Son with him makes sure he has acces to 3 protein drinks per day.  With wt loss is worsening stamina and higher fall risk it seems with less balance, has walker with him today, no recent falls.  Has ongoing pain with vicodin use but denies constipation  Did also have transient severe pain and swelling to the right hand last wk for the first time, did not seek medical attention, no prior hx of gout but seemed to quickly resolve, no trauma or fever.  Pt denies polydipsia, polyuria, or new focal neuro s/s.   Pt denies fever, wt loss, night sweats, loss of appetite, or other constitutional symptoms         Wt Readings from Last 3 Encounters:  03/10/21 137 lb (62.1 kg)  02/20/21 153 lb 3.5 oz (69.5 kg)  12/12/20 160 lb (72.6 kg)   BP Readings from Last 3 Encounters:  03/10/21 130/62  02/25/21 124/61  12/12/20 126/68         Past Medical History:  Diagnosis Date   Aortic stenosis    DEGENERATIVE JOINT DISEASE, LUMBAR SPINE 01/06/2007   Qualifier: Diagnosis of  By: Dance CMA (AAMA), Orvan JulyKim     FASCIITIS, PLANTAR 01/06/2007   Qualifier: Diagnosis of  By: Dance CMA (AAMA), Kim     GERD (gastroesophageal reflux disease) 10/02/2013   HYPERTENSION 01/06/2007   Qualifier: Diagnosis of  By: Dance CMA (AAMA), Kim     Other abnormal blood chemistry 02/03/2007   Qualifier: Diagnosis  of  By: Debby BudNorins MD, Rosalyn GessMichael E    Past Surgical History:  Procedure Laterality Date   BLEPHAROPLASTY  2006   Bilateral   CATARACT EXTRACTION  2006   bilateral   plantar fascilitis     ROTATOR CUFF REPAIR  2007   right   TONSILLECTOMY      reports that he has quit smoking. He has never used smokeless tobacco. He reports current alcohol use. He reports that he does not use drugs. family history includes Other in his brother, father, mother, and sister. No Known Allergies Current Outpatient Medications on File Prior to Visit  Medication Sig Dispense Refill   amLODipine (NORVASC) 5 MG tablet TAKE 1 TABLET (5 MG TOTAL) BY MOUTH DAILY. 90 tablet 1   cholecalciferol (VITAMIN D) 1000 UNITS tablet Take 1,000 Units by mouth daily.     HYDROcodone-acetaminophen (NORCO) 7.5-325 MG tablet Take 1 tablet by mouth 2 (two) times daily as needed for moderate pain or severe pain (chronic pain). 60 tablet 0   Loperamide HCl (IMODIUM PO) Take by mouth.     No current facility-administered medications on file prior to visit.        ROS:  All others reviewed and negative.  Objective        PE:  BP 130/62 (BP Location: Right Arm, Patient Position: Sitting, Cuff Size: Large)    Pulse 67    Temp 98.8 F (37.1 C) (Oral)    Ht 5\' 7"  (1.702 m)    Wt 137 lb (62.1 kg)    SpO2 97%    BMI 21.46 kg/m                 Constitutional: Pt appears in NAD               HENT: Head: NCAT.                Right Ear: External ear normal.                 Left Ear: External ear normal.                Eyes: . Pupils are equal, round, and reactive to light. Conjunctivae and EOM are normal               Nose: without d/c or deformity               Neck: Neck supple. Gross normal ROM               Cardiovascular: Normal rate and regular rhythm.                 Pulmonary/Chest: Effort normal and breath sounds without rales or wheezing.                Abd:  Soft, NT, ND, + BS, no organomegaly               Neurological: Pt is  alert. At baseline orientation, motor grossly intact               Skin: Skin is warm. No rashes, no other new lesions, LE edema - trace pedal edema right > left               Psychiatric: Pt behavior is normal without agitation   Micro: none  Cardiac tracings I have personally interpreted today:  none  Pertinent Radiological findings (summarize): none   Lab Results  Component Value Date   WBC 7.6 03/10/2021   HGB 11.2 (L) 03/10/2021   HCT 33.9 (L) 03/10/2021   PLT 374.0 03/10/2021   GLUCOSE 102 (H) 03/10/2021   CHOL 139 06/10/2020   TRIG 78.0 06/10/2020   HDL 37.50 (L) 06/10/2020   LDLCALC 86 06/10/2020   ALT 20 03/10/2021   AST 19 03/10/2021   NA 139 03/10/2021   K 3.7 03/10/2021   CL 101 03/10/2021   CREATININE 0.67 03/10/2021   BUN 24 (H) 03/10/2021   CO2 28 03/10/2021   TSH 2.47 03/10/2021   PSA 1.75 02/03/2007   INR 1.07 06/11/2017   HGBA1C 6.2 03/10/2021   Assessment/Plan:  Christopher Tran is a 88 y.o. White or Caucasian [1] male with  has a past medical history of Aortic stenosis, DEGENERATIVE JOINT DISEASE, LUMBAR SPINE (01/06/2007), FASCIITIS, PLANTAR (01/06/2007), GERD (gastroesophageal reflux disease) (10/02/2013), HYPERTENSION (01/06/2007), and Other abnormal blood chemistry (02/03/2007).  Chronic diastolic CHF (congestive heart failure) (HCC) ? Mild worsening, also with hx of AS, pt to f/u with cardiology as planned  Chronic pain syndrome To continue pain control with hydrocodone prn  Essential hypertension, benign BP Readings from Last 3 Encounters:  03/10/21 130/62  02/25/21 124/61  12/12/20 126/68   Stable, pt to continue medical treatment norvasc  Physical deconditioning With recent worsening, ? Geriatric decline, for [facility] with RN, PT, aide  Prediabetes Lab Results  Component Value Date   HGBA1C 6.2 03/10/2021   Stable, pt to continue current medical treatment  - diet   Vitamin D deficiency Last vitamin D Lab Results  Component Value  Date   VD25OH 23.90 (L) 03/10/2021   Low, reminded to start oral replacement   Right hand pain High suspicion for gout episode, for prednsone prn,  to f/u any worsening symptoms or concerns  Weight loss Recent worsening, For megace daily,  to f/u any worsening symptoms or concerns  Followup: Return in about 2 weeks (around 03/24/2021).  Oliver Barre, MD 03/12/2021 6:44 AM [facility] [place]

## 2021-03-12 ENCOUNTER — Encounter: Payer: Self-pay | Admitting: Internal Medicine

## 2021-03-12 DIAGNOSIS — R634 Abnormal weight loss: Secondary | ICD-10-CM | POA: Insufficient documentation

## 2021-03-12 DIAGNOSIS — M79641 Pain in right hand: Secondary | ICD-10-CM | POA: Insufficient documentation

## 2021-03-12 NOTE — Assessment & Plan Note (Signed)
Lab Results  °Component Value Date  ° HGBA1C 6.2 03/10/2021  ° °Stable, pt to continue current medical treatment  - diet ° °

## 2021-03-12 NOTE — Assessment & Plan Note (Signed)
To continue pain control with hydrocodone prn

## 2021-03-12 NOTE — Assessment & Plan Note (Signed)
Recent worsening, For megace daily,  to f/u any worsening symptoms or concerns

## 2021-03-12 NOTE — Assessment & Plan Note (Signed)
BP Readings from Last 3 Encounters:  03/10/21 130/62  02/25/21 124/61  12/12/20 126/68   Stable, pt to continue medical treatment norvasc

## 2021-03-12 NOTE — Assessment & Plan Note (Signed)
High suspicion for gout episode, for prednsone prn,  to f/u any worsening symptoms or concerns

## 2021-03-12 NOTE — Assessment & Plan Note (Signed)
Last vitamin D Lab Results  Component Value Date   VD25OH 23.90 (L) 03/10/2021   Low, reminded to start oral replacement

## 2021-03-12 NOTE — Assessment & Plan Note (Signed)
With recent worsening, ? Geriatric decline, for HH with RN, PT, aide

## 2021-03-12 NOTE — Assessment & Plan Note (Signed)
?   Mild worsening, also with hx of AS, pt to f/u with cardiology as planned

## 2021-03-13 MED ORDER — METHOCARBAMOL 500 MG PO TABS: 500.0000 mg | ORAL_TABLET | Freq: Four times a day (QID) | ORAL | 2 refills | Status: DC | PRN

## 2021-03-14 DIAGNOSIS — M47816 Spondylosis without myelopathy or radiculopathy, lumbar region: Secondary | ICD-10-CM | POA: Diagnosis not present

## 2021-03-14 DIAGNOSIS — I110 Hypertensive heart disease with heart failure: Secondary | ICD-10-CM | POA: Diagnosis not present

## 2021-03-14 DIAGNOSIS — I350 Nonrheumatic aortic (valve) stenosis: Secondary | ICD-10-CM | POA: Diagnosis not present

## 2021-03-14 DIAGNOSIS — E876 Hypokalemia: Secondary | ICD-10-CM | POA: Diagnosis not present

## 2021-03-14 DIAGNOSIS — U071 COVID-19: Secondary | ICD-10-CM | POA: Diagnosis not present

## 2021-03-14 DIAGNOSIS — I5032 Chronic diastolic (congestive) heart failure: Secondary | ICD-10-CM | POA: Diagnosis not present

## 2021-03-15 DIAGNOSIS — I5032 Chronic diastolic (congestive) heart failure: Secondary | ICD-10-CM | POA: Diagnosis not present

## 2021-03-15 DIAGNOSIS — E876 Hypokalemia: Secondary | ICD-10-CM | POA: Diagnosis not present

## 2021-03-15 DIAGNOSIS — I110 Hypertensive heart disease with heart failure: Secondary | ICD-10-CM | POA: Diagnosis not present

## 2021-03-15 DIAGNOSIS — M47816 Spondylosis without myelopathy or radiculopathy, lumbar region: Secondary | ICD-10-CM | POA: Diagnosis not present

## 2021-03-15 DIAGNOSIS — U071 COVID-19: Secondary | ICD-10-CM | POA: Diagnosis not present

## 2021-03-15 DIAGNOSIS — I350 Nonrheumatic aortic (valve) stenosis: Secondary | ICD-10-CM | POA: Diagnosis not present

## 2021-03-16 DIAGNOSIS — I5032 Chronic diastolic (congestive) heart failure: Secondary | ICD-10-CM | POA: Diagnosis not present

## 2021-03-16 DIAGNOSIS — U071 COVID-19: Secondary | ICD-10-CM | POA: Diagnosis not present

## 2021-03-16 DIAGNOSIS — I350 Nonrheumatic aortic (valve) stenosis: Secondary | ICD-10-CM | POA: Diagnosis not present

## 2021-03-16 DIAGNOSIS — I110 Hypertensive heart disease with heart failure: Secondary | ICD-10-CM | POA: Diagnosis not present

## 2021-03-16 DIAGNOSIS — M47816 Spondylosis without myelopathy or radiculopathy, lumbar region: Secondary | ICD-10-CM | POA: Diagnosis not present

## 2021-03-16 DIAGNOSIS — E876 Hypokalemia: Secondary | ICD-10-CM | POA: Diagnosis not present

## 2021-03-17 ENCOUNTER — Telehealth: Payer: Self-pay | Admitting: Internal Medicine

## 2021-03-17 NOTE — Telephone Encounter (Signed)
Verbals given  

## 2021-03-17 NOTE — Telephone Encounter (Signed)
Ok for verbals 

## 2021-03-17 NOTE — Telephone Encounter (Signed)
HH ORDERS   Caller Name: [facility] Agency Name: [facility] Callback Phone #: [phone] Service Requested: OT Frequency of Visits: 1 week 7

## 2021-03-21 DIAGNOSIS — E876 Hypokalemia: Secondary | ICD-10-CM | POA: Diagnosis not present

## 2021-03-21 DIAGNOSIS — U071 COVID-19: Secondary | ICD-10-CM | POA: Diagnosis not present

## 2021-03-21 DIAGNOSIS — I110 Hypertensive heart disease with heart failure: Secondary | ICD-10-CM | POA: Diagnosis not present

## 2021-03-21 DIAGNOSIS — I350 Nonrheumatic aortic (valve) stenosis: Secondary | ICD-10-CM | POA: Diagnosis not present

## 2021-03-21 DIAGNOSIS — M47816 Spondylosis without myelopathy or radiculopathy, lumbar region: Secondary | ICD-10-CM | POA: Diagnosis not present

## 2021-03-21 DIAGNOSIS — I5032 Chronic diastolic (congestive) heart failure: Secondary | ICD-10-CM | POA: Diagnosis not present

## 2021-03-22 DIAGNOSIS — I110 Hypertensive heart disease with heart failure: Secondary | ICD-10-CM | POA: Diagnosis not present

## 2021-03-22 DIAGNOSIS — U071 COVID-19: Secondary | ICD-10-CM | POA: Diagnosis not present

## 2021-03-22 DIAGNOSIS — I5032 Chronic diastolic (congestive) heart failure: Secondary | ICD-10-CM | POA: Diagnosis not present

## 2021-03-22 DIAGNOSIS — E876 Hypokalemia: Secondary | ICD-10-CM | POA: Diagnosis not present

## 2021-03-22 DIAGNOSIS — I350 Nonrheumatic aortic (valve) stenosis: Secondary | ICD-10-CM | POA: Diagnosis not present

## 2021-03-22 DIAGNOSIS — M47816 Spondylosis without myelopathy or radiculopathy, lumbar region: Secondary | ICD-10-CM | POA: Diagnosis not present

## 2021-03-23 DIAGNOSIS — I110 Hypertensive heart disease with heart failure: Secondary | ICD-10-CM | POA: Diagnosis not present

## 2021-03-23 DIAGNOSIS — E876 Hypokalemia: Secondary | ICD-10-CM | POA: Diagnosis not present

## 2021-03-23 DIAGNOSIS — U071 COVID-19: Secondary | ICD-10-CM | POA: Diagnosis not present

## 2021-03-23 DIAGNOSIS — I350 Nonrheumatic aortic (valve) stenosis: Secondary | ICD-10-CM | POA: Diagnosis not present

## 2021-03-23 DIAGNOSIS — M47816 Spondylosis without myelopathy or radiculopathy, lumbar region: Secondary | ICD-10-CM | POA: Diagnosis not present

## 2021-03-23 DIAGNOSIS — I5032 Chronic diastolic (congestive) heart failure: Secondary | ICD-10-CM | POA: Diagnosis not present

## 2021-03-24 ENCOUNTER — Telehealth: Payer: Self-pay

## 2021-03-24 DIAGNOSIS — I350 Nonrheumatic aortic (valve) stenosis: Secondary | ICD-10-CM | POA: Diagnosis not present

## 2021-03-24 DIAGNOSIS — E876 Hypokalemia: Secondary | ICD-10-CM | POA: Diagnosis not present

## 2021-03-24 DIAGNOSIS — I5032 Chronic diastolic (congestive) heart failure: Secondary | ICD-10-CM | POA: Diagnosis not present

## 2021-03-24 DIAGNOSIS — I110 Hypertensive heart disease with heart failure: Secondary | ICD-10-CM | POA: Diagnosis not present

## 2021-03-24 DIAGNOSIS — M47816 Spondylosis without myelopathy or radiculopathy, lumbar region: Secondary | ICD-10-CM | POA: Diagnosis not present

## 2021-03-24 DIAGNOSIS — U071 COVID-19: Secondary | ICD-10-CM | POA: Diagnosis not present

## 2021-03-24 NOTE — Telephone Encounter (Signed)
Transition Care Management Unsuccessful Follow-up Telephone Call  Date of discharge and from where:  02/25/2021  [facility]  Attempts:  1st Attempt  Reason for unsuccessful TCM follow-up call:  No answer/busy Rowe Pavy, RN, BSN, CEN [facility] Coordinator [phone]

## 2021-03-28 DIAGNOSIS — E876 Hypokalemia: Secondary | ICD-10-CM | POA: Diagnosis not present

## 2021-03-28 DIAGNOSIS — U071 COVID-19: Secondary | ICD-10-CM | POA: Diagnosis not present

## 2021-03-28 DIAGNOSIS — I110 Hypertensive heart disease with heart failure: Secondary | ICD-10-CM | POA: Diagnosis not present

## 2021-03-28 DIAGNOSIS — I5032 Chronic diastolic (congestive) heart failure: Secondary | ICD-10-CM | POA: Diagnosis not present

## 2021-03-28 DIAGNOSIS — I350 Nonrheumatic aortic (valve) stenosis: Secondary | ICD-10-CM | POA: Diagnosis not present

## 2021-03-28 DIAGNOSIS — M47816 Spondylosis without myelopathy or radiculopathy, lumbar region: Secondary | ICD-10-CM | POA: Diagnosis not present

## 2021-03-29 ENCOUNTER — Other Ambulatory Visit: Payer: Self-pay

## 2021-03-29 ENCOUNTER — Ambulatory Visit (INDEPENDENT_AMBULATORY_CARE_PROVIDER_SITE_OTHER): Payer: Medicare Other | Admitting: Internal Medicine

## 2021-03-29 ENCOUNTER — Encounter: Payer: Self-pay | Admitting: Internal Medicine

## 2021-03-29 VITALS — BP 132/80 | HR 92 | Ht 67.0 in | Wt 143.0 lb

## 2021-03-29 DIAGNOSIS — I10 Essential (primary) hypertension: Secondary | ICD-10-CM

## 2021-03-29 DIAGNOSIS — I350 Nonrheumatic aortic (valve) stenosis: Secondary | ICD-10-CM | POA: Diagnosis not present

## 2021-03-29 DIAGNOSIS — R7303 Prediabetes: Secondary | ICD-10-CM | POA: Diagnosis not present

## 2021-03-29 DIAGNOSIS — E559 Vitamin D deficiency, unspecified: Secondary | ICD-10-CM

## 2021-03-29 NOTE — Progress Notes (Signed)
Patient ID: Christopher Tran, male   DOB: 05/24/1925, 88 y.o.   MRN: [id number]        Chief Complaint: follow up severe AS, htn, preDM, vit d deficiency       HPI:  Christopher Tran is a 88 y.o. male here with son, Pt denies chest pain, increased sob or doe, wheezing, orthopnea, PND, palpitations, dizziness or syncope, but has recent mild bipedal edema in the last several wks, worse in the pm, better in the AM.   Pt denies polydipsia, polyuria, or new focal neuro s/s.  Pt now s/p recent covid.  Sleep has been difficult, hard to get to sleep most nights, not tried melatonin recently.  Has known severe AS, records indicate a recommendation for f/u Echo this month, but for some reason son is not supportive and does not want this for him and pt agrees for now.   Wt Readings from Last 3 Encounters:  03/29/21 143 lb (64.9 kg)  03/10/21 137 lb (62.1 kg)  02/20/21 153 lb 3.5 oz (69.5 kg)   BP Readings from Last 3 Encounters:  03/29/21 132/80  03/10/21 130/62  02/25/21 124/61         Past Medical History:  Diagnosis Date   Aortic stenosis    DEGENERATIVE JOINT DISEASE, LUMBAR SPINE 01/06/2007   Qualifier: Diagnosis of  By: Dance CMA (AAMA), Orvan July, PLANTAR 01/06/2007   Qualifier: Diagnosis of  By: Dance CMA (AAMA), Kim     GERD (gastroesophageal reflux disease) 10/02/2013   HYPERTENSION 01/06/2007   Qualifier: Diagnosis of  By: Dance CMA (AAMA), Kim     Other abnormal blood chemistry 02/03/2007   Qualifier: Diagnosis of  By: Debby Bud MD, Rosalyn Gess    Past Surgical History:  Procedure Laterality Date   BLEPHAROPLASTY  2006   Bilateral   CATARACT EXTRACTION  2006   bilateral   plantar fascilitis     ROTATOR CUFF REPAIR  2007   right   TONSILLECTOMY      reports that he has quit smoking. He has never used smokeless tobacco. He reports current alcohol use. He reports that he does not use drugs. family history includes Other in his brother, father, mother, and sister. No Known  Allergies Current Outpatient Medications on File Prior to Visit  Medication Sig Dispense Refill   amLODipine (NORVASC) 5 MG tablet TAKE 1 TABLET (5 MG TOTAL) BY MOUTH DAILY. 90 tablet 1   cholecalciferol (VITAMIN D) 1000 UNITS tablet Take 1,000 Units by mouth daily.     HYDROcodone-acetaminophen (NORCO) 7.5-325 MG tablet Take 1 tablet by mouth 2 (two) times daily as needed for moderate pain or severe pain (chronic pain). 60 tablet 0   Loperamide HCl (IMODIUM PO) Take by mouth.     megestrol (MEGACE) 40 MG tablet Take 1 tablet (40 mg total) by mouth daily. 90 tablet 1   methocarbamol (ROBAXIN) 500 MG tablet Take 1 tablet (500 mg total) by mouth every 6 (six) hours as needed for muscle spasms. 40 tablet 2   predniSONE (DELTASONE) 10 MG tablet 3 tabs by mouth per day for 5 days prn gout 15 tablet 3   No current facility-administered medications on file prior to visit.        ROS:  All others reviewed and negative.  Objective        PE:  BP 132/80 (BP Location: Left Arm, Patient Position: Sitting, Cuff Size: Normal)    Pulse 92  Ht 5\' 7"  (1.702 m)    Wt 143 lb (64.9 kg)    SpO2 99%    BMI 22.40 kg/m                 Constitutional: Pt appears in NAD               HENT: Head: NCAT.                Right Ear: External ear normal.                 Left Ear: External ear normal.                Eyes: . Pupils are equal, round, and reactive to light. Conjunctivae and EOM are normal               Nose: without d/c or deformity               Neck: Neck supple. Gross normal ROM               Cardiovascular: Normal rate and regular rhythm.                 Pulmonary/Chest: Effort normal and breath sounds without rales or wheezing.                Abd:  Soft, NT, ND, + BS, no organomegaly               Neurological: Pt is alert. At baseline orientation, motor grossly intact               Skin: Skin is warm. No rashes, no other new lesions, LE edema - none               Psychiatric: Pt behavior is  normal without agitation   Micro: none  Cardiac tracings I have personally interpreted today:  none  Pertinent Radiological findings (summarize): none   Lab Results  Component Value Date   WBC 7.6 03/10/2021   HGB 11.2 (L) 03/10/2021   HCT 33.9 (L) 03/10/2021   PLT 374.0 03/10/2021   GLUCOSE 102 (H) 03/10/2021   CHOL 139 06/10/2020   TRIG 78.0 06/10/2020   HDL 37.50 (L) 06/10/2020   LDLCALC 86 06/10/2020   ALT 20 03/10/2021   AST 19 03/10/2021   NA 139 03/10/2021   K 3.7 03/10/2021   CL 101 03/10/2021   CREATININE 0.67 03/10/2021   BUN 24 (H) 03/10/2021   CO2 28 03/10/2021   TSH 2.47 03/10/2021   PSA 1.75 02/03/2007   INR 1.07 06/11/2017   HGBA1C 6.2 03/10/2021   Assessment/Plan:  Christopher Tran is a 88 y.o. White or Caucasian [1] male with  has a past medical history of Aortic stenosis, DEGENERATIVE JOINT DISEASE, LUMBAR SPINE (01/06/2007), FASCIITIS, PLANTAR (01/06/2007), GERD (gastroesophageal reflux disease) (10/02/2013), HYPERTENSION (01/06/2007), and Other abnormal blood chemistry (02/03/2007).  Severe aortic stenosis dw son and pt, son simply not interested or supportive in f/u Echo or cardiology for now  Essential hypertension, benign BP Readings from Last 3 Encounters:  03/29/21 132/80  03/10/21 130/62  02/25/21 124/61   Stable, pt to continue medical treatment norvasc   Prediabetes Lab Results  Component Value Date   HGBA1C 6.2 03/10/2021   Stable, pt to continue current medical treatment  - diet   Vitamin D deficiency Last vitamin D Lab Results  Component Value Date   VD25OH 23.90 (L) 03/10/2021   Low,  to start oral replacement  Followup: Return in about 4 months (around 07/27/2021).  Oliver Barre, MD 04/01/2021 8:32 PM [facility] [place] [facility] - [facility]

## 2021-03-29 NOTE — Patient Instructions (Signed)
We will recheck your weight today  Please continue all other medications as before, and refills have been done if requested.  Please have the pharmacy call with any other refills you may need.  Please continue your efforts at being more active, low cholesterol diet, and weight control.  Please keep your appointments with your specialists as you may have planned  - and please call if you change your mind about seeing cardiology and the echocardiogram  Please continue the Home Physical Therapy  Please make an Appointment to return in 4 months, or sooner if needed

## 2021-03-30 DIAGNOSIS — E876 Hypokalemia: Secondary | ICD-10-CM | POA: Diagnosis not present

## 2021-03-30 DIAGNOSIS — U071 COVID-19: Secondary | ICD-10-CM | POA: Diagnosis not present

## 2021-03-30 DIAGNOSIS — M722 Plantar fascial fibromatosis: Secondary | ICD-10-CM | POA: Diagnosis not present

## 2021-03-30 DIAGNOSIS — K219 Gastro-esophageal reflux disease without esophagitis: Secondary | ICD-10-CM | POA: Diagnosis not present

## 2021-03-30 DIAGNOSIS — I110 Hypertensive heart disease with heart failure: Secondary | ICD-10-CM | POA: Diagnosis not present

## 2021-03-30 DIAGNOSIS — I5032 Chronic diastolic (congestive) heart failure: Secondary | ICD-10-CM | POA: Diagnosis not present

## 2021-03-30 DIAGNOSIS — I350 Nonrheumatic aortic (valve) stenosis: Secondary | ICD-10-CM | POA: Diagnosis not present

## 2021-03-30 DIAGNOSIS — G894 Chronic pain syndrome: Secondary | ICD-10-CM | POA: Diagnosis not present

## 2021-03-30 DIAGNOSIS — R7303 Prediabetes: Secondary | ICD-10-CM | POA: Diagnosis not present

## 2021-03-30 DIAGNOSIS — Z9181 History of falling: Secondary | ICD-10-CM | POA: Diagnosis not present

## 2021-03-30 DIAGNOSIS — M47816 Spondylosis without myelopathy or radiculopathy, lumbar region: Secondary | ICD-10-CM | POA: Diagnosis not present

## 2021-03-30 DIAGNOSIS — Z87891 Personal history of nicotine dependence: Secondary | ICD-10-CM | POA: Diagnosis not present

## 2021-03-31 DIAGNOSIS — I5032 Chronic diastolic (congestive) heart failure: Secondary | ICD-10-CM | POA: Diagnosis not present

## 2021-03-31 DIAGNOSIS — M47816 Spondylosis without myelopathy or radiculopathy, lumbar region: Secondary | ICD-10-CM | POA: Diagnosis not present

## 2021-03-31 DIAGNOSIS — I110 Hypertensive heart disease with heart failure: Secondary | ICD-10-CM | POA: Diagnosis not present

## 2021-03-31 DIAGNOSIS — E876 Hypokalemia: Secondary | ICD-10-CM | POA: Diagnosis not present

## 2021-03-31 DIAGNOSIS — I350 Nonrheumatic aortic (valve) stenosis: Secondary | ICD-10-CM | POA: Diagnosis not present

## 2021-03-31 DIAGNOSIS — U071 COVID-19: Secondary | ICD-10-CM | POA: Diagnosis not present

## 2021-04-01 ENCOUNTER — Encounter: Payer: Self-pay | Admitting: Internal Medicine

## 2021-04-01 NOTE — Assessment & Plan Note (Signed)
dw son and pt, son simply not interested or supportive in f/u Echo or cardiology for now

## 2021-04-01 NOTE — Assessment & Plan Note (Signed)
Last vitamin D Lab Results  Component Value Date   VD25OH 23.90 (L) 03/10/2021   Low, to start oral replacement

## 2021-04-01 NOTE — Assessment & Plan Note (Signed)
Lab Results  Component Value Date   HGBA1C 6.2 03/10/2021   Stable, pt to continue current medical treatment  - diet

## 2021-04-01 NOTE — Assessment & Plan Note (Signed)
BP Readings from Last 3 Encounters:  03/29/21 132/80  03/10/21 130/62  02/25/21 124/61   Stable, pt to continue medical treatment norvasc

## 2021-04-03 DIAGNOSIS — M47816 Spondylosis without myelopathy or radiculopathy, lumbar region: Secondary | ICD-10-CM | POA: Diagnosis not present

## 2021-04-03 DIAGNOSIS — I110 Hypertensive heart disease with heart failure: Secondary | ICD-10-CM | POA: Diagnosis not present

## 2021-04-03 DIAGNOSIS — I5032 Chronic diastolic (congestive) heart failure: Secondary | ICD-10-CM | POA: Diagnosis not present

## 2021-04-03 DIAGNOSIS — E876 Hypokalemia: Secondary | ICD-10-CM | POA: Diagnosis not present

## 2021-04-03 DIAGNOSIS — U071 COVID-19: Secondary | ICD-10-CM | POA: Diagnosis not present

## 2021-04-03 DIAGNOSIS — I350 Nonrheumatic aortic (valve) stenosis: Secondary | ICD-10-CM | POA: Diagnosis not present

## 2021-04-06 DIAGNOSIS — E876 Hypokalemia: Secondary | ICD-10-CM | POA: Diagnosis not present

## 2021-04-06 DIAGNOSIS — M47816 Spondylosis without myelopathy or radiculopathy, lumbar region: Secondary | ICD-10-CM | POA: Diagnosis not present

## 2021-04-06 DIAGNOSIS — I5032 Chronic diastolic (congestive) heart failure: Secondary | ICD-10-CM | POA: Diagnosis not present

## 2021-04-06 DIAGNOSIS — U071 COVID-19: Secondary | ICD-10-CM | POA: Diagnosis not present

## 2021-04-06 DIAGNOSIS — I110 Hypertensive heart disease with heart failure: Secondary | ICD-10-CM | POA: Diagnosis not present

## 2021-04-06 DIAGNOSIS — I350 Nonrheumatic aortic (valve) stenosis: Secondary | ICD-10-CM | POA: Diagnosis not present

## 2021-04-10 DIAGNOSIS — M47816 Spondylosis without myelopathy or radiculopathy, lumbar region: Secondary | ICD-10-CM | POA: Diagnosis not present

## 2021-04-10 DIAGNOSIS — U071 COVID-19: Secondary | ICD-10-CM | POA: Diagnosis not present

## 2021-04-10 DIAGNOSIS — E876 Hypokalemia: Secondary | ICD-10-CM | POA: Diagnosis not present

## 2021-04-10 DIAGNOSIS — I110 Hypertensive heart disease with heart failure: Secondary | ICD-10-CM | POA: Diagnosis not present

## 2021-04-10 DIAGNOSIS — I350 Nonrheumatic aortic (valve) stenosis: Secondary | ICD-10-CM | POA: Diagnosis not present

## 2021-04-10 DIAGNOSIS — I5032 Chronic diastolic (congestive) heart failure: Secondary | ICD-10-CM | POA: Diagnosis not present

## 2021-04-12 ENCOUNTER — Other Ambulatory Visit: Payer: Self-pay | Admitting: Internal Medicine

## 2021-04-12 DIAGNOSIS — M47816 Spondylosis without myelopathy or radiculopathy, lumbar region: Secondary | ICD-10-CM | POA: Diagnosis not present

## 2021-04-12 DIAGNOSIS — G894 Chronic pain syndrome: Secondary | ICD-10-CM

## 2021-04-12 DIAGNOSIS — I110 Hypertensive heart disease with heart failure: Secondary | ICD-10-CM | POA: Diagnosis not present

## 2021-04-12 DIAGNOSIS — I5032 Chronic diastolic (congestive) heart failure: Secondary | ICD-10-CM | POA: Diagnosis not present

## 2021-04-12 DIAGNOSIS — U071 COVID-19: Secondary | ICD-10-CM | POA: Diagnosis not present

## 2021-04-12 DIAGNOSIS — I350 Nonrheumatic aortic (valve) stenosis: Secondary | ICD-10-CM | POA: Diagnosis not present

## 2021-04-12 DIAGNOSIS — E876 Hypokalemia: Secondary | ICD-10-CM | POA: Diagnosis not present

## 2021-04-12 DIAGNOSIS — M545 Low back pain, unspecified: Secondary | ICD-10-CM

## 2021-04-13 MED ORDER — HYDROCODONE-ACETAMINOPHEN 7.5-325 MG PO TABS: 1.0000 | ORAL_TABLET | Freq: Two times a day (BID) | ORAL | 0 refills | Status: DC | PRN

## 2021-04-14 DIAGNOSIS — I110 Hypertensive heart disease with heart failure: Secondary | ICD-10-CM | POA: Diagnosis not present

## 2021-04-14 DIAGNOSIS — I350 Nonrheumatic aortic (valve) stenosis: Secondary | ICD-10-CM | POA: Diagnosis not present

## 2021-04-14 DIAGNOSIS — U071 COVID-19: Secondary | ICD-10-CM | POA: Diagnosis not present

## 2021-04-14 DIAGNOSIS — I5032 Chronic diastolic (congestive) heart failure: Secondary | ICD-10-CM | POA: Diagnosis not present

## 2021-04-14 DIAGNOSIS — M47816 Spondylosis without myelopathy or radiculopathy, lumbar region: Secondary | ICD-10-CM | POA: Diagnosis not present

## 2021-04-14 DIAGNOSIS — E876 Hypokalemia: Secondary | ICD-10-CM | POA: Diagnosis not present

## 2021-04-17 ENCOUNTER — Encounter: Payer: Self-pay | Admitting: Internal Medicine

## 2021-04-17 MED ORDER — HYDROCODONE BIT-HOMATROP MBR 5-1.5 MG/5ML PO SOLN: 5.0000 mL | Freq: Four times a day (QID) | ORAL | 0 refills | Status: AC | PRN

## 2021-04-18 DIAGNOSIS — E876 Hypokalemia: Secondary | ICD-10-CM | POA: Diagnosis not present

## 2021-04-18 DIAGNOSIS — I110 Hypertensive heart disease with heart failure: Secondary | ICD-10-CM | POA: Diagnosis not present

## 2021-04-18 DIAGNOSIS — I5032 Chronic diastolic (congestive) heart failure: Secondary | ICD-10-CM | POA: Diagnosis not present

## 2021-04-18 DIAGNOSIS — M47816 Spondylosis without myelopathy or radiculopathy, lumbar region: Secondary | ICD-10-CM | POA: Diagnosis not present

## 2021-04-18 DIAGNOSIS — U071 COVID-19: Secondary | ICD-10-CM | POA: Diagnosis not present

## 2021-04-18 DIAGNOSIS — I350 Nonrheumatic aortic (valve) stenosis: Secondary | ICD-10-CM | POA: Diagnosis not present

## 2021-04-18 NOTE — Telephone Encounter (Signed)
FYI

## 2021-04-24 ENCOUNTER — Telehealth: Payer: Self-pay | Admitting: Internal Medicine

## 2021-04-24 ENCOUNTER — Encounter: Payer: Self-pay | Admitting: Internal Medicine

## 2021-04-24 DIAGNOSIS — I5032 Chronic diastolic (congestive) heart failure: Secondary | ICD-10-CM

## 2021-04-24 DIAGNOSIS — M47816 Spondylosis without myelopathy or radiculopathy, lumbar region: Secondary | ICD-10-CM | POA: Diagnosis not present

## 2021-04-24 DIAGNOSIS — I350 Nonrheumatic aortic (valve) stenosis: Secondary | ICD-10-CM | POA: Diagnosis not present

## 2021-04-24 DIAGNOSIS — U071 COVID-19: Secondary | ICD-10-CM | POA: Diagnosis not present

## 2021-04-24 DIAGNOSIS — G894 Chronic pain syndrome: Secondary | ICD-10-CM

## 2021-04-24 DIAGNOSIS — R5381 Other malaise: Secondary | ICD-10-CM

## 2021-04-24 DIAGNOSIS — E876 Hypokalemia: Secondary | ICD-10-CM | POA: Diagnosis not present

## 2021-04-24 DIAGNOSIS — I110 Hypertensive heart disease with heart failure: Secondary | ICD-10-CM | POA: Diagnosis not present

## 2021-04-24 NOTE — Telephone Encounter (Signed)
Ok this is done 

## 2021-04-24 NOTE — Telephone Encounter (Signed)
Darl Pikes at the [facility] nurse called and stated that due to no progress pt will be discharged today from nursing and Wednesday from OT.  FYI

## 2021-04-24 NOTE — Telephone Encounter (Signed)
Patient granddaughter Christopher Tran calling in  [facility] home health nurse will be calling us to notify that they are requesting hospice care on behalf of patient  Just wanted to make provider & nurse aware they family is agreeing to hospice care request

## 2021-04-25 NOTE — Telephone Encounter (Signed)
Called patient granddaughter Wells Guiles to make her aware that a referral to Hospice care was placed

## 2021-04-26 DIAGNOSIS — I342 Nonrheumatic mitral (valve) stenosis: Secondary | ICD-10-CM | POA: Diagnosis not present

## 2021-04-26 DIAGNOSIS — I110 Hypertensive heart disease with heart failure: Secondary | ICD-10-CM | POA: Diagnosis not present

## 2021-04-26 DIAGNOSIS — I350 Nonrheumatic aortic (valve) stenosis: Secondary | ICD-10-CM | POA: Diagnosis not present

## 2021-04-26 DIAGNOSIS — E559 Vitamin D deficiency, unspecified: Secondary | ICD-10-CM | POA: Diagnosis not present

## 2021-04-26 DIAGNOSIS — E440 Moderate protein-calorie malnutrition: Secondary | ICD-10-CM | POA: Diagnosis not present

## 2021-04-26 DIAGNOSIS — M109 Gout, unspecified: Secondary | ICD-10-CM | POA: Diagnosis not present

## 2021-04-26 DIAGNOSIS — R1310 Dysphagia, unspecified: Secondary | ICD-10-CM | POA: Diagnosis not present

## 2021-04-26 DIAGNOSIS — I5032 Chronic diastolic (congestive) heart failure: Secondary | ICD-10-CM | POA: Diagnosis not present

## 2021-04-26 DIAGNOSIS — Z8616 Personal history of COVID-19: Secondary | ICD-10-CM | POA: Diagnosis not present

## 2021-04-27 ENCOUNTER — Telehealth: Payer: Self-pay

## 2021-04-27 DIAGNOSIS — Z8616 Personal history of COVID-19: Secondary | ICD-10-CM | POA: Diagnosis not present

## 2021-04-27 DIAGNOSIS — I342 Nonrheumatic mitral (valve) stenosis: Secondary | ICD-10-CM | POA: Diagnosis not present

## 2021-04-27 DIAGNOSIS — I350 Nonrheumatic aortic (valve) stenosis: Secondary | ICD-10-CM | POA: Diagnosis not present

## 2021-04-27 DIAGNOSIS — E440 Moderate protein-calorie malnutrition: Secondary | ICD-10-CM | POA: Diagnosis not present

## 2021-04-27 DIAGNOSIS — I110 Hypertensive heart disease with heart failure: Secondary | ICD-10-CM | POA: Diagnosis not present

## 2021-04-27 DIAGNOSIS — R1310 Dysphagia, unspecified: Secondary | ICD-10-CM | POA: Diagnosis not present

## 2021-04-27 NOTE — Telephone Encounter (Signed)
[facility] calling in to if Dr. Jonny Ruiz  will be his Hospice Attending Physician  Please advise [phone]

## 2021-04-27 NOTE — Telephone Encounter (Signed)
Ok for me to be hospice attending, but also ok for hospice MD to provide symptomatic meds

## 2021-04-28 DIAGNOSIS — I350 Nonrheumatic aortic (valve) stenosis: Secondary | ICD-10-CM | POA: Diagnosis not present

## 2021-04-28 DIAGNOSIS — E440 Moderate protein-calorie malnutrition: Secondary | ICD-10-CM | POA: Diagnosis not present

## 2021-04-28 DIAGNOSIS — I110 Hypertensive heart disease with heart failure: Secondary | ICD-10-CM | POA: Diagnosis not present

## 2021-04-28 DIAGNOSIS — I342 Nonrheumatic mitral (valve) stenosis: Secondary | ICD-10-CM | POA: Diagnosis not present

## 2021-04-28 DIAGNOSIS — Z8616 Personal history of COVID-19: Secondary | ICD-10-CM | POA: Diagnosis not present

## 2021-04-28 DIAGNOSIS — R1310 Dysphagia, unspecified: Secondary | ICD-10-CM | POA: Diagnosis not present

## 2021-04-28 NOTE — Telephone Encounter (Signed)
Called Jasmine at [facility] to give verbals per Dr. Jenny Reichmann

## 2021-05-01 DIAGNOSIS — I342 Nonrheumatic mitral (valve) stenosis: Secondary | ICD-10-CM | POA: Diagnosis not present

## 2021-05-01 DIAGNOSIS — E440 Moderate protein-calorie malnutrition: Secondary | ICD-10-CM | POA: Diagnosis not present

## 2021-05-01 DIAGNOSIS — I350 Nonrheumatic aortic (valve) stenosis: Secondary | ICD-10-CM | POA: Diagnosis not present

## 2021-05-01 DIAGNOSIS — R1310 Dysphagia, unspecified: Secondary | ICD-10-CM | POA: Diagnosis not present

## 2021-05-01 DIAGNOSIS — I110 Hypertensive heart disease with heart failure: Secondary | ICD-10-CM | POA: Diagnosis not present

## 2021-05-01 DIAGNOSIS — Z8616 Personal history of COVID-19: Secondary | ICD-10-CM | POA: Diagnosis not present

## 2021-05-02 DIAGNOSIS — Z8616 Personal history of COVID-19: Secondary | ICD-10-CM | POA: Diagnosis not present

## 2021-05-02 DIAGNOSIS — I342 Nonrheumatic mitral (valve) stenosis: Secondary | ICD-10-CM | POA: Diagnosis not present

## 2021-05-02 DIAGNOSIS — I350 Nonrheumatic aortic (valve) stenosis: Secondary | ICD-10-CM | POA: Diagnosis not present

## 2021-05-02 DIAGNOSIS — E440 Moderate protein-calorie malnutrition: Secondary | ICD-10-CM | POA: Diagnosis not present

## 2021-05-02 DIAGNOSIS — I110 Hypertensive heart disease with heart failure: Secondary | ICD-10-CM | POA: Diagnosis not present

## 2021-05-02 DIAGNOSIS — R1310 Dysphagia, unspecified: Secondary | ICD-10-CM | POA: Diagnosis not present

## 2021-05-03 DIAGNOSIS — I350 Nonrheumatic aortic (valve) stenosis: Secondary | ICD-10-CM | POA: Diagnosis not present

## 2021-05-03 DIAGNOSIS — I110 Hypertensive heart disease with heart failure: Secondary | ICD-10-CM | POA: Diagnosis not present

## 2021-05-03 DIAGNOSIS — Z8616 Personal history of COVID-19: Secondary | ICD-10-CM | POA: Diagnosis not present

## 2021-05-03 DIAGNOSIS — E440 Moderate protein-calorie malnutrition: Secondary | ICD-10-CM | POA: Diagnosis not present

## 2021-05-03 DIAGNOSIS — M109 Gout, unspecified: Secondary | ICD-10-CM | POA: Diagnosis not present

## 2021-05-03 DIAGNOSIS — I342 Nonrheumatic mitral (valve) stenosis: Secondary | ICD-10-CM | POA: Diagnosis not present

## 2021-05-03 DIAGNOSIS — I5032 Chronic diastolic (congestive) heart failure: Secondary | ICD-10-CM | POA: Diagnosis not present

## 2021-05-03 DIAGNOSIS — E559 Vitamin D deficiency, unspecified: Secondary | ICD-10-CM | POA: Diagnosis not present

## 2021-05-03 DIAGNOSIS — R1310 Dysphagia, unspecified: Secondary | ICD-10-CM | POA: Diagnosis not present

## 2021-05-04 DIAGNOSIS — I350 Nonrheumatic aortic (valve) stenosis: Secondary | ICD-10-CM | POA: Diagnosis not present

## 2021-05-04 DIAGNOSIS — Z8616 Personal history of COVID-19: Secondary | ICD-10-CM | POA: Diagnosis not present

## 2021-05-04 DIAGNOSIS — R1310 Dysphagia, unspecified: Secondary | ICD-10-CM | POA: Diagnosis not present

## 2021-05-04 DIAGNOSIS — I110 Hypertensive heart disease with heart failure: Secondary | ICD-10-CM | POA: Diagnosis not present

## 2021-05-04 DIAGNOSIS — I342 Nonrheumatic mitral (valve) stenosis: Secondary | ICD-10-CM | POA: Diagnosis not present

## 2021-05-04 DIAGNOSIS — E440 Moderate protein-calorie malnutrition: Secondary | ICD-10-CM | POA: Diagnosis not present

## 2021-05-05 DIAGNOSIS — E440 Moderate protein-calorie malnutrition: Secondary | ICD-10-CM | POA: Diagnosis not present

## 2021-05-05 DIAGNOSIS — Z8616 Personal history of COVID-19: Secondary | ICD-10-CM | POA: Diagnosis not present

## 2021-05-05 DIAGNOSIS — I342 Nonrheumatic mitral (valve) stenosis: Secondary | ICD-10-CM | POA: Diagnosis not present

## 2021-05-05 DIAGNOSIS — I110 Hypertensive heart disease with heart failure: Secondary | ICD-10-CM | POA: Diagnosis not present

## 2021-05-05 DIAGNOSIS — R1310 Dysphagia, unspecified: Secondary | ICD-10-CM | POA: Diagnosis not present

## 2021-05-05 DIAGNOSIS — I350 Nonrheumatic aortic (valve) stenosis: Secondary | ICD-10-CM | POA: Diagnosis not present

## 2021-05-06 DIAGNOSIS — I350 Nonrheumatic aortic (valve) stenosis: Secondary | ICD-10-CM | POA: Diagnosis not present

## 2021-05-06 DIAGNOSIS — I110 Hypertensive heart disease with heart failure: Secondary | ICD-10-CM | POA: Diagnosis not present

## 2021-05-06 DIAGNOSIS — Z8616 Personal history of COVID-19: Secondary | ICD-10-CM | POA: Diagnosis not present

## 2021-05-06 DIAGNOSIS — E440 Moderate protein-calorie malnutrition: Secondary | ICD-10-CM | POA: Diagnosis not present

## 2021-05-06 DIAGNOSIS — R1310 Dysphagia, unspecified: Secondary | ICD-10-CM | POA: Diagnosis not present

## 2021-05-06 DIAGNOSIS — I342 Nonrheumatic mitral (valve) stenosis: Secondary | ICD-10-CM | POA: Diagnosis not present

## 2021-05-08 DIAGNOSIS — Z8616 Personal history of COVID-19: Secondary | ICD-10-CM | POA: Diagnosis not present

## 2021-05-08 DIAGNOSIS — I110 Hypertensive heart disease with heart failure: Secondary | ICD-10-CM | POA: Diagnosis not present

## 2021-05-08 DIAGNOSIS — E440 Moderate protein-calorie malnutrition: Secondary | ICD-10-CM | POA: Diagnosis not present

## 2021-05-08 DIAGNOSIS — R1310 Dysphagia, unspecified: Secondary | ICD-10-CM | POA: Diagnosis not present

## 2021-05-08 DIAGNOSIS — I350 Nonrheumatic aortic (valve) stenosis: Secondary | ICD-10-CM | POA: Diagnosis not present

## 2021-05-08 DIAGNOSIS — I342 Nonrheumatic mitral (valve) stenosis: Secondary | ICD-10-CM | POA: Diagnosis not present

## 2021-05-10 DIAGNOSIS — R1310 Dysphagia, unspecified: Secondary | ICD-10-CM | POA: Diagnosis not present

## 2021-05-10 DIAGNOSIS — I110 Hypertensive heart disease with heart failure: Secondary | ICD-10-CM | POA: Diagnosis not present

## 2021-05-10 DIAGNOSIS — I342 Nonrheumatic mitral (valve) stenosis: Secondary | ICD-10-CM | POA: Diagnosis not present

## 2021-05-10 DIAGNOSIS — I350 Nonrheumatic aortic (valve) stenosis: Secondary | ICD-10-CM | POA: Diagnosis not present

## 2021-05-10 DIAGNOSIS — Z8616 Personal history of COVID-19: Secondary | ICD-10-CM | POA: Diagnosis not present

## 2021-05-10 DIAGNOSIS — E440 Moderate protein-calorie malnutrition: Secondary | ICD-10-CM | POA: Diagnosis not present

## 2021-05-11 DIAGNOSIS — E440 Moderate protein-calorie malnutrition: Secondary | ICD-10-CM | POA: Diagnosis not present

## 2021-05-11 DIAGNOSIS — I350 Nonrheumatic aortic (valve) stenosis: Secondary | ICD-10-CM | POA: Diagnosis not present

## 2021-05-11 DIAGNOSIS — I342 Nonrheumatic mitral (valve) stenosis: Secondary | ICD-10-CM | POA: Diagnosis not present

## 2021-05-11 DIAGNOSIS — R1310 Dysphagia, unspecified: Secondary | ICD-10-CM | POA: Diagnosis not present

## 2021-05-11 DIAGNOSIS — I110 Hypertensive heart disease with heart failure: Secondary | ICD-10-CM | POA: Diagnosis not present

## 2021-05-11 DIAGNOSIS — Z8616 Personal history of COVID-19: Secondary | ICD-10-CM | POA: Diagnosis not present

## 2021-05-12 DIAGNOSIS — I110 Hypertensive heart disease with heart failure: Secondary | ICD-10-CM | POA: Diagnosis not present

## 2021-05-12 DIAGNOSIS — Z8616 Personal history of COVID-19: Secondary | ICD-10-CM | POA: Diagnosis not present

## 2021-05-12 DIAGNOSIS — R1310 Dysphagia, unspecified: Secondary | ICD-10-CM | POA: Diagnosis not present

## 2021-05-12 DIAGNOSIS — I350 Nonrheumatic aortic (valve) stenosis: Secondary | ICD-10-CM | POA: Diagnosis not present

## 2021-05-12 DIAGNOSIS — E440 Moderate protein-calorie malnutrition: Secondary | ICD-10-CM | POA: Diagnosis not present

## 2021-05-12 DIAGNOSIS — I342 Nonrheumatic mitral (valve) stenosis: Secondary | ICD-10-CM | POA: Diagnosis not present

## 2021-05-15 DIAGNOSIS — E440 Moderate protein-calorie malnutrition: Secondary | ICD-10-CM | POA: Diagnosis not present

## 2021-05-15 DIAGNOSIS — I342 Nonrheumatic mitral (valve) stenosis: Secondary | ICD-10-CM | POA: Diagnosis not present

## 2021-05-15 DIAGNOSIS — R1310 Dysphagia, unspecified: Secondary | ICD-10-CM | POA: Diagnosis not present

## 2021-05-15 DIAGNOSIS — Z8616 Personal history of COVID-19: Secondary | ICD-10-CM | POA: Diagnosis not present

## 2021-05-15 DIAGNOSIS — I110 Hypertensive heart disease with heart failure: Secondary | ICD-10-CM | POA: Diagnosis not present

## 2021-05-15 DIAGNOSIS — I350 Nonrheumatic aortic (valve) stenosis: Secondary | ICD-10-CM | POA: Diagnosis not present

## 2021-05-17 DIAGNOSIS — Z8616 Personal history of COVID-19: Secondary | ICD-10-CM | POA: Diagnosis not present

## 2021-05-17 DIAGNOSIS — I342 Nonrheumatic mitral (valve) stenosis: Secondary | ICD-10-CM | POA: Diagnosis not present

## 2021-05-17 DIAGNOSIS — I350 Nonrheumatic aortic (valve) stenosis: Secondary | ICD-10-CM | POA: Diagnosis not present

## 2021-05-17 DIAGNOSIS — I110 Hypertensive heart disease with heart failure: Secondary | ICD-10-CM | POA: Diagnosis not present

## 2021-05-17 DIAGNOSIS — E440 Moderate protein-calorie malnutrition: Secondary | ICD-10-CM | POA: Diagnosis not present

## 2021-05-17 DIAGNOSIS — R1310 Dysphagia, unspecified: Secondary | ICD-10-CM | POA: Diagnosis not present

## 2021-05-19 DIAGNOSIS — R1310 Dysphagia, unspecified: Secondary | ICD-10-CM | POA: Diagnosis not present

## 2021-05-19 DIAGNOSIS — I110 Hypertensive heart disease with heart failure: Secondary | ICD-10-CM | POA: Diagnosis not present

## 2021-05-19 DIAGNOSIS — E440 Moderate protein-calorie malnutrition: Secondary | ICD-10-CM | POA: Diagnosis not present

## 2021-05-19 DIAGNOSIS — Z8616 Personal history of COVID-19: Secondary | ICD-10-CM | POA: Diagnosis not present

## 2021-05-19 DIAGNOSIS — I350 Nonrheumatic aortic (valve) stenosis: Secondary | ICD-10-CM | POA: Diagnosis not present

## 2021-05-19 DIAGNOSIS — I342 Nonrheumatic mitral (valve) stenosis: Secondary | ICD-10-CM | POA: Diagnosis not present

## 2021-05-22 DIAGNOSIS — I350 Nonrheumatic aortic (valve) stenosis: Secondary | ICD-10-CM | POA: Diagnosis not present

## 2021-05-22 DIAGNOSIS — Z8616 Personal history of COVID-19: Secondary | ICD-10-CM | POA: Diagnosis not present

## 2021-05-22 DIAGNOSIS — R1310 Dysphagia, unspecified: Secondary | ICD-10-CM | POA: Diagnosis not present

## 2021-05-22 DIAGNOSIS — I110 Hypertensive heart disease with heart failure: Secondary | ICD-10-CM | POA: Diagnosis not present

## 2021-05-22 DIAGNOSIS — I342 Nonrheumatic mitral (valve) stenosis: Secondary | ICD-10-CM | POA: Diagnosis not present

## 2021-05-22 DIAGNOSIS — E440 Moderate protein-calorie malnutrition: Secondary | ICD-10-CM | POA: Diagnosis not present

## 2021-05-24 DIAGNOSIS — R1310 Dysphagia, unspecified: Secondary | ICD-10-CM | POA: Diagnosis not present

## 2021-05-24 DIAGNOSIS — I350 Nonrheumatic aortic (valve) stenosis: Secondary | ICD-10-CM | POA: Diagnosis not present

## 2021-05-24 DIAGNOSIS — I110 Hypertensive heart disease with heart failure: Secondary | ICD-10-CM | POA: Diagnosis not present

## 2021-05-24 DIAGNOSIS — Z8616 Personal history of COVID-19: Secondary | ICD-10-CM | POA: Diagnosis not present

## 2021-05-24 DIAGNOSIS — E440 Moderate protein-calorie malnutrition: Secondary | ICD-10-CM | POA: Diagnosis not present

## 2021-05-24 DIAGNOSIS — I342 Nonrheumatic mitral (valve) stenosis: Secondary | ICD-10-CM | POA: Diagnosis not present

## 2021-05-25 DIAGNOSIS — R1310 Dysphagia, unspecified: Secondary | ICD-10-CM | POA: Diagnosis not present

## 2021-05-25 DIAGNOSIS — I350 Nonrheumatic aortic (valve) stenosis: Secondary | ICD-10-CM | POA: Diagnosis not present

## 2021-05-25 DIAGNOSIS — I110 Hypertensive heart disease with heart failure: Secondary | ICD-10-CM | POA: Diagnosis not present

## 2021-05-25 DIAGNOSIS — E440 Moderate protein-calorie malnutrition: Secondary | ICD-10-CM | POA: Diagnosis not present

## 2021-05-25 DIAGNOSIS — I342 Nonrheumatic mitral (valve) stenosis: Secondary | ICD-10-CM | POA: Diagnosis not present

## 2021-05-25 DIAGNOSIS — Z8616 Personal history of COVID-19: Secondary | ICD-10-CM | POA: Diagnosis not present

## 2021-05-26 DIAGNOSIS — R1310 Dysphagia, unspecified: Secondary | ICD-10-CM | POA: Diagnosis not present

## 2021-05-26 DIAGNOSIS — I350 Nonrheumatic aortic (valve) stenosis: Secondary | ICD-10-CM | POA: Diagnosis not present

## 2021-05-26 DIAGNOSIS — E440 Moderate protein-calorie malnutrition: Secondary | ICD-10-CM | POA: Diagnosis not present

## 2021-05-26 DIAGNOSIS — I110 Hypertensive heart disease with heart failure: Secondary | ICD-10-CM | POA: Diagnosis not present

## 2021-05-26 DIAGNOSIS — Z8616 Personal history of COVID-19: Secondary | ICD-10-CM | POA: Diagnosis not present

## 2021-05-26 DIAGNOSIS — I342 Nonrheumatic mitral (valve) stenosis: Secondary | ICD-10-CM | POA: Diagnosis not present

## 2021-05-29 DIAGNOSIS — Z8616 Personal history of COVID-19: Secondary | ICD-10-CM | POA: Diagnosis not present

## 2021-05-29 DIAGNOSIS — I110 Hypertensive heart disease with heart failure: Secondary | ICD-10-CM | POA: Diagnosis not present

## 2021-05-29 DIAGNOSIS — E440 Moderate protein-calorie malnutrition: Secondary | ICD-10-CM | POA: Diagnosis not present

## 2021-05-29 DIAGNOSIS — I350 Nonrheumatic aortic (valve) stenosis: Secondary | ICD-10-CM | POA: Diagnosis not present

## 2021-05-29 DIAGNOSIS — I342 Nonrheumatic mitral (valve) stenosis: Secondary | ICD-10-CM | POA: Diagnosis not present

## 2021-05-29 DIAGNOSIS — R1310 Dysphagia, unspecified: Secondary | ICD-10-CM | POA: Diagnosis not present

## 2021-05-30 DIAGNOSIS — I342 Nonrheumatic mitral (valve) stenosis: Secondary | ICD-10-CM | POA: Diagnosis not present

## 2021-05-30 DIAGNOSIS — I350 Nonrheumatic aortic (valve) stenosis: Secondary | ICD-10-CM | POA: Diagnosis not present

## 2021-05-30 DIAGNOSIS — Z8616 Personal history of COVID-19: Secondary | ICD-10-CM | POA: Diagnosis not present

## 2021-05-30 DIAGNOSIS — E440 Moderate protein-calorie malnutrition: Secondary | ICD-10-CM | POA: Diagnosis not present

## 2021-05-30 DIAGNOSIS — R1310 Dysphagia, unspecified: Secondary | ICD-10-CM | POA: Diagnosis not present

## 2021-05-30 DIAGNOSIS — I110 Hypertensive heart disease with heart failure: Secondary | ICD-10-CM | POA: Diagnosis not present

## 2021-05-31 DIAGNOSIS — Z8616 Personal history of COVID-19: Secondary | ICD-10-CM | POA: Diagnosis not present

## 2021-05-31 DIAGNOSIS — I350 Nonrheumatic aortic (valve) stenosis: Secondary | ICD-10-CM | POA: Diagnosis not present

## 2021-05-31 DIAGNOSIS — I110 Hypertensive heart disease with heart failure: Secondary | ICD-10-CM | POA: Diagnosis not present

## 2021-05-31 DIAGNOSIS — I342 Nonrheumatic mitral (valve) stenosis: Secondary | ICD-10-CM | POA: Diagnosis not present

## 2021-05-31 DIAGNOSIS — R1310 Dysphagia, unspecified: Secondary | ICD-10-CM | POA: Diagnosis not present

## 2021-05-31 DIAGNOSIS — E440 Moderate protein-calorie malnutrition: Secondary | ICD-10-CM | POA: Diagnosis not present

## 2021-06-01 DIAGNOSIS — I342 Nonrheumatic mitral (valve) stenosis: Secondary | ICD-10-CM | POA: Diagnosis not present

## 2021-06-01 DIAGNOSIS — Z8616 Personal history of COVID-19: Secondary | ICD-10-CM | POA: Diagnosis not present

## 2021-06-01 DIAGNOSIS — R1310 Dysphagia, unspecified: Secondary | ICD-10-CM | POA: Diagnosis not present

## 2021-06-01 DIAGNOSIS — I350 Nonrheumatic aortic (valve) stenosis: Secondary | ICD-10-CM | POA: Diagnosis not present

## 2021-06-01 DIAGNOSIS — I110 Hypertensive heart disease with heart failure: Secondary | ICD-10-CM | POA: Diagnosis not present

## 2021-06-01 DIAGNOSIS — E440 Moderate protein-calorie malnutrition: Secondary | ICD-10-CM | POA: Diagnosis not present

## 2021-06-02 DIAGNOSIS — I350 Nonrheumatic aortic (valve) stenosis: Secondary | ICD-10-CM | POA: Diagnosis not present

## 2021-06-02 DIAGNOSIS — Z8616 Personal history of COVID-19: Secondary | ICD-10-CM | POA: Diagnosis not present

## 2021-06-02 DIAGNOSIS — I342 Nonrheumatic mitral (valve) stenosis: Secondary | ICD-10-CM | POA: Diagnosis not present

## 2021-06-02 DIAGNOSIS — R1310 Dysphagia, unspecified: Secondary | ICD-10-CM | POA: Diagnosis not present

## 2021-06-02 DIAGNOSIS — E440 Moderate protein-calorie malnutrition: Secondary | ICD-10-CM | POA: Diagnosis not present

## 2021-06-02 DIAGNOSIS — I110 Hypertensive heart disease with heart failure: Secondary | ICD-10-CM | POA: Diagnosis not present

## 2021-06-03 DIAGNOSIS — E559 Vitamin D deficiency, unspecified: Secondary | ICD-10-CM | POA: Diagnosis not present

## 2021-06-03 DIAGNOSIS — E440 Moderate protein-calorie malnutrition: Secondary | ICD-10-CM | POA: Diagnosis not present

## 2021-06-03 DIAGNOSIS — Z8616 Personal history of COVID-19: Secondary | ICD-10-CM | POA: Diagnosis not present

## 2021-06-03 DIAGNOSIS — I110 Hypertensive heart disease with heart failure: Secondary | ICD-10-CM | POA: Diagnosis not present

## 2021-06-03 DIAGNOSIS — M109 Gout, unspecified: Secondary | ICD-10-CM | POA: Diagnosis not present

## 2021-06-03 DIAGNOSIS — I342 Nonrheumatic mitral (valve) stenosis: Secondary | ICD-10-CM | POA: Diagnosis not present

## 2021-06-03 DIAGNOSIS — I350 Nonrheumatic aortic (valve) stenosis: Secondary | ICD-10-CM | POA: Diagnosis not present

## 2021-06-03 DIAGNOSIS — I5032 Chronic diastolic (congestive) heart failure: Secondary | ICD-10-CM | POA: Diagnosis not present

## 2021-06-03 DIAGNOSIS — R1310 Dysphagia, unspecified: Secondary | ICD-10-CM | POA: Diagnosis not present

## 2021-06-05 DIAGNOSIS — R1310 Dysphagia, unspecified: Secondary | ICD-10-CM | POA: Diagnosis not present

## 2021-06-05 DIAGNOSIS — I342 Nonrheumatic mitral (valve) stenosis: Secondary | ICD-10-CM | POA: Diagnosis not present

## 2021-06-05 DIAGNOSIS — E440 Moderate protein-calorie malnutrition: Secondary | ICD-10-CM | POA: Diagnosis not present

## 2021-06-05 DIAGNOSIS — Z8616 Personal history of COVID-19: Secondary | ICD-10-CM | POA: Diagnosis not present

## 2021-06-05 DIAGNOSIS — I110 Hypertensive heart disease with heart failure: Secondary | ICD-10-CM | POA: Diagnosis not present

## 2021-06-05 DIAGNOSIS — I350 Nonrheumatic aortic (valve) stenosis: Secondary | ICD-10-CM | POA: Diagnosis not present

## 2021-06-06 DIAGNOSIS — I110 Hypertensive heart disease with heart failure: Secondary | ICD-10-CM | POA: Diagnosis not present

## 2021-06-06 DIAGNOSIS — Z8616 Personal history of COVID-19: Secondary | ICD-10-CM | POA: Diagnosis not present

## 2021-06-06 DIAGNOSIS — R1310 Dysphagia, unspecified: Secondary | ICD-10-CM | POA: Diagnosis not present

## 2021-06-06 DIAGNOSIS — E440 Moderate protein-calorie malnutrition: Secondary | ICD-10-CM | POA: Diagnosis not present

## 2021-06-06 DIAGNOSIS — I350 Nonrheumatic aortic (valve) stenosis: Secondary | ICD-10-CM | POA: Diagnosis not present

## 2021-06-06 DIAGNOSIS — I342 Nonrheumatic mitral (valve) stenosis: Secondary | ICD-10-CM | POA: Diagnosis not present

## 2021-06-07 DIAGNOSIS — E440 Moderate protein-calorie malnutrition: Secondary | ICD-10-CM | POA: Diagnosis not present

## 2021-06-07 DIAGNOSIS — I350 Nonrheumatic aortic (valve) stenosis: Secondary | ICD-10-CM | POA: Diagnosis not present

## 2021-06-07 DIAGNOSIS — Z8616 Personal history of COVID-19: Secondary | ICD-10-CM | POA: Diagnosis not present

## 2021-06-07 DIAGNOSIS — R1310 Dysphagia, unspecified: Secondary | ICD-10-CM | POA: Diagnosis not present

## 2021-06-07 DIAGNOSIS — I342 Nonrheumatic mitral (valve) stenosis: Secondary | ICD-10-CM | POA: Diagnosis not present

## 2021-06-07 DIAGNOSIS — I110 Hypertensive heart disease with heart failure: Secondary | ICD-10-CM | POA: Diagnosis not present

## 2021-06-09 DIAGNOSIS — E440 Moderate protein-calorie malnutrition: Secondary | ICD-10-CM | POA: Diagnosis not present

## 2021-06-09 DIAGNOSIS — I342 Nonrheumatic mitral (valve) stenosis: Secondary | ICD-10-CM | POA: Diagnosis not present

## 2021-06-09 DIAGNOSIS — Z8616 Personal history of COVID-19: Secondary | ICD-10-CM | POA: Diagnosis not present

## 2021-06-09 DIAGNOSIS — R1310 Dysphagia, unspecified: Secondary | ICD-10-CM | POA: Diagnosis not present

## 2021-06-09 DIAGNOSIS — I350 Nonrheumatic aortic (valve) stenosis: Secondary | ICD-10-CM | POA: Diagnosis not present

## 2021-06-09 DIAGNOSIS — I110 Hypertensive heart disease with heart failure: Secondary | ICD-10-CM | POA: Diagnosis not present

## 2021-06-12 DIAGNOSIS — R1310 Dysphagia, unspecified: Secondary | ICD-10-CM | POA: Diagnosis not present

## 2021-06-12 DIAGNOSIS — I110 Hypertensive heart disease with heart failure: Secondary | ICD-10-CM | POA: Diagnosis not present

## 2021-06-12 DIAGNOSIS — I342 Nonrheumatic mitral (valve) stenosis: Secondary | ICD-10-CM | POA: Diagnosis not present

## 2021-06-12 DIAGNOSIS — E440 Moderate protein-calorie malnutrition: Secondary | ICD-10-CM | POA: Diagnosis not present

## 2021-06-12 DIAGNOSIS — I350 Nonrheumatic aortic (valve) stenosis: Secondary | ICD-10-CM | POA: Diagnosis not present

## 2021-06-12 DIAGNOSIS — Z8616 Personal history of COVID-19: Secondary | ICD-10-CM | POA: Diagnosis not present

## 2021-06-13 ENCOUNTER — Ambulatory Visit: Payer: Medicare Other | Admitting: Internal Medicine

## 2021-06-14 DIAGNOSIS — Z8616 Personal history of COVID-19: Secondary | ICD-10-CM | POA: Diagnosis not present

## 2021-06-14 DIAGNOSIS — I110 Hypertensive heart disease with heart failure: Secondary | ICD-10-CM | POA: Diagnosis not present

## 2021-06-14 DIAGNOSIS — E440 Moderate protein-calorie malnutrition: Secondary | ICD-10-CM | POA: Diagnosis not present

## 2021-06-14 DIAGNOSIS — I342 Nonrheumatic mitral (valve) stenosis: Secondary | ICD-10-CM | POA: Diagnosis not present

## 2021-06-14 DIAGNOSIS — R1310 Dysphagia, unspecified: Secondary | ICD-10-CM | POA: Diagnosis not present

## 2021-06-14 DIAGNOSIS — I350 Nonrheumatic aortic (valve) stenosis: Secondary | ICD-10-CM | POA: Diagnosis not present

## 2021-06-16 DIAGNOSIS — I342 Nonrheumatic mitral (valve) stenosis: Secondary | ICD-10-CM | POA: Diagnosis not present

## 2021-06-16 DIAGNOSIS — I110 Hypertensive heart disease with heart failure: Secondary | ICD-10-CM | POA: Diagnosis not present

## 2021-06-16 DIAGNOSIS — E440 Moderate protein-calorie malnutrition: Secondary | ICD-10-CM | POA: Diagnosis not present

## 2021-06-16 DIAGNOSIS — I350 Nonrheumatic aortic (valve) stenosis: Secondary | ICD-10-CM | POA: Diagnosis not present

## 2021-06-16 DIAGNOSIS — R1310 Dysphagia, unspecified: Secondary | ICD-10-CM | POA: Diagnosis not present

## 2021-06-16 DIAGNOSIS — Z8616 Personal history of COVID-19: Secondary | ICD-10-CM | POA: Diagnosis not present

## 2021-06-19 DIAGNOSIS — I110 Hypertensive heart disease with heart failure: Secondary | ICD-10-CM | POA: Diagnosis not present

## 2021-06-19 DIAGNOSIS — I350 Nonrheumatic aortic (valve) stenosis: Secondary | ICD-10-CM | POA: Diagnosis not present

## 2021-06-19 DIAGNOSIS — E440 Moderate protein-calorie malnutrition: Secondary | ICD-10-CM | POA: Diagnosis not present

## 2021-06-19 DIAGNOSIS — R1310 Dysphagia, unspecified: Secondary | ICD-10-CM | POA: Diagnosis not present

## 2021-06-19 DIAGNOSIS — I342 Nonrheumatic mitral (valve) stenosis: Secondary | ICD-10-CM | POA: Diagnosis not present

## 2021-06-19 DIAGNOSIS — Z8616 Personal history of COVID-19: Secondary | ICD-10-CM | POA: Diagnosis not present

## 2021-06-20 DIAGNOSIS — E440 Moderate protein-calorie malnutrition: Secondary | ICD-10-CM | POA: Diagnosis not present

## 2021-06-20 DIAGNOSIS — I350 Nonrheumatic aortic (valve) stenosis: Secondary | ICD-10-CM | POA: Diagnosis not present

## 2021-06-20 DIAGNOSIS — I110 Hypertensive heart disease with heart failure: Secondary | ICD-10-CM | POA: Diagnosis not present

## 2021-06-20 DIAGNOSIS — Z8616 Personal history of COVID-19: Secondary | ICD-10-CM | POA: Diagnosis not present

## 2021-06-20 DIAGNOSIS — R1310 Dysphagia, unspecified: Secondary | ICD-10-CM | POA: Diagnosis not present

## 2021-06-20 DIAGNOSIS — I342 Nonrheumatic mitral (valve) stenosis: Secondary | ICD-10-CM | POA: Diagnosis not present

## 2021-06-21 DIAGNOSIS — R1310 Dysphagia, unspecified: Secondary | ICD-10-CM | POA: Diagnosis not present

## 2021-06-21 DIAGNOSIS — E440 Moderate protein-calorie malnutrition: Secondary | ICD-10-CM | POA: Diagnosis not present

## 2021-06-21 DIAGNOSIS — I350 Nonrheumatic aortic (valve) stenosis: Secondary | ICD-10-CM | POA: Diagnosis not present

## 2021-06-21 DIAGNOSIS — Z8616 Personal history of COVID-19: Secondary | ICD-10-CM | POA: Diagnosis not present

## 2021-06-21 DIAGNOSIS — I342 Nonrheumatic mitral (valve) stenosis: Secondary | ICD-10-CM | POA: Diagnosis not present

## 2021-06-21 DIAGNOSIS — I110 Hypertensive heart disease with heart failure: Secondary | ICD-10-CM | POA: Diagnosis not present

## 2021-06-23 DIAGNOSIS — E440 Moderate protein-calorie malnutrition: Secondary | ICD-10-CM | POA: Diagnosis not present

## 2021-06-23 DIAGNOSIS — Z8616 Personal history of COVID-19: Secondary | ICD-10-CM | POA: Diagnosis not present

## 2021-06-23 DIAGNOSIS — R1310 Dysphagia, unspecified: Secondary | ICD-10-CM | POA: Diagnosis not present

## 2021-06-23 DIAGNOSIS — I342 Nonrheumatic mitral (valve) stenosis: Secondary | ICD-10-CM | POA: Diagnosis not present

## 2021-06-23 DIAGNOSIS — I350 Nonrheumatic aortic (valve) stenosis: Secondary | ICD-10-CM | POA: Diagnosis not present

## 2021-06-23 DIAGNOSIS — I110 Hypertensive heart disease with heart failure: Secondary | ICD-10-CM | POA: Diagnosis not present

## 2021-06-26 DIAGNOSIS — Z8616 Personal history of COVID-19: Secondary | ICD-10-CM | POA: Diagnosis not present

## 2021-06-26 DIAGNOSIS — E440 Moderate protein-calorie malnutrition: Secondary | ICD-10-CM | POA: Diagnosis not present

## 2021-06-26 DIAGNOSIS — R1310 Dysphagia, unspecified: Secondary | ICD-10-CM | POA: Diagnosis not present

## 2021-06-26 DIAGNOSIS — I110 Hypertensive heart disease with heart failure: Secondary | ICD-10-CM | POA: Diagnosis not present

## 2021-06-26 DIAGNOSIS — I342 Nonrheumatic mitral (valve) stenosis: Secondary | ICD-10-CM | POA: Diagnosis not present

## 2021-06-26 DIAGNOSIS — I350 Nonrheumatic aortic (valve) stenosis: Secondary | ICD-10-CM | POA: Diagnosis not present

## 2021-06-28 DIAGNOSIS — R1310 Dysphagia, unspecified: Secondary | ICD-10-CM | POA: Diagnosis not present

## 2021-06-28 DIAGNOSIS — Z8616 Personal history of COVID-19: Secondary | ICD-10-CM | POA: Diagnosis not present

## 2021-06-28 DIAGNOSIS — I342 Nonrheumatic mitral (valve) stenosis: Secondary | ICD-10-CM | POA: Diagnosis not present

## 2021-06-28 DIAGNOSIS — I350 Nonrheumatic aortic (valve) stenosis: Secondary | ICD-10-CM | POA: Diagnosis not present

## 2021-06-28 DIAGNOSIS — E440 Moderate protein-calorie malnutrition: Secondary | ICD-10-CM | POA: Diagnosis not present

## 2021-06-28 DIAGNOSIS — I110 Hypertensive heart disease with heart failure: Secondary | ICD-10-CM | POA: Diagnosis not present

## 2021-06-30 DIAGNOSIS — I350 Nonrheumatic aortic (valve) stenosis: Secondary | ICD-10-CM | POA: Diagnosis not present

## 2021-06-30 DIAGNOSIS — I342 Nonrheumatic mitral (valve) stenosis: Secondary | ICD-10-CM | POA: Diagnosis not present

## 2021-06-30 DIAGNOSIS — Z8616 Personal history of COVID-19: Secondary | ICD-10-CM | POA: Diagnosis not present

## 2021-06-30 DIAGNOSIS — I110 Hypertensive heart disease with heart failure: Secondary | ICD-10-CM | POA: Diagnosis not present

## 2021-06-30 DIAGNOSIS — R1310 Dysphagia, unspecified: Secondary | ICD-10-CM | POA: Diagnosis not present

## 2021-06-30 DIAGNOSIS — E440 Moderate protein-calorie malnutrition: Secondary | ICD-10-CM | POA: Diagnosis not present

## 2021-07-01 DIAGNOSIS — R1310 Dysphagia, unspecified: Secondary | ICD-10-CM | POA: Diagnosis not present

## 2021-07-01 DIAGNOSIS — I350 Nonrheumatic aortic (valve) stenosis: Secondary | ICD-10-CM | POA: Diagnosis not present

## 2021-07-01 DIAGNOSIS — Z8616 Personal history of COVID-19: Secondary | ICD-10-CM | POA: Diagnosis not present

## 2021-07-01 DIAGNOSIS — I110 Hypertensive heart disease with heart failure: Secondary | ICD-10-CM | POA: Diagnosis not present

## 2021-07-01 DIAGNOSIS — I342 Nonrheumatic mitral (valve) stenosis: Secondary | ICD-10-CM | POA: Diagnosis not present

## 2021-07-01 DIAGNOSIS — E440 Moderate protein-calorie malnutrition: Secondary | ICD-10-CM | POA: Diagnosis not present

## 2021-07-03 DIAGNOSIS — M109 Gout, unspecified: Secondary | ICD-10-CM | POA: Diagnosis not present

## 2021-07-03 DIAGNOSIS — I342 Nonrheumatic mitral (valve) stenosis: Secondary | ICD-10-CM | POA: Diagnosis not present

## 2021-07-03 DIAGNOSIS — I350 Nonrheumatic aortic (valve) stenosis: Secondary | ICD-10-CM | POA: Diagnosis not present

## 2021-07-03 DIAGNOSIS — E559 Vitamin D deficiency, unspecified: Secondary | ICD-10-CM | POA: Diagnosis not present

## 2021-07-03 DIAGNOSIS — E440 Moderate protein-calorie malnutrition: Secondary | ICD-10-CM | POA: Diagnosis not present

## 2021-07-03 DIAGNOSIS — R1310 Dysphagia, unspecified: Secondary | ICD-10-CM | POA: Diagnosis not present

## 2021-07-03 DIAGNOSIS — Z8616 Personal history of COVID-19: Secondary | ICD-10-CM | POA: Diagnosis not present

## 2021-07-03 DIAGNOSIS — K831 Obstruction of bile duct: Secondary | ICD-10-CM | POA: Diagnosis not present

## 2021-07-03 DIAGNOSIS — I110 Hypertensive heart disease with heart failure: Secondary | ICD-10-CM | POA: Diagnosis not present

## 2021-07-03 DIAGNOSIS — I5032 Chronic diastolic (congestive) heart failure: Secondary | ICD-10-CM | POA: Diagnosis not present

## 2021-07-05 DIAGNOSIS — R1310 Dysphagia, unspecified: Secondary | ICD-10-CM | POA: Diagnosis not present

## 2021-07-05 DIAGNOSIS — K831 Obstruction of bile duct: Secondary | ICD-10-CM | POA: Diagnosis not present

## 2021-07-05 DIAGNOSIS — I350 Nonrheumatic aortic (valve) stenosis: Secondary | ICD-10-CM | POA: Diagnosis not present

## 2021-07-05 DIAGNOSIS — E440 Moderate protein-calorie malnutrition: Secondary | ICD-10-CM | POA: Diagnosis not present

## 2021-07-05 DIAGNOSIS — I342 Nonrheumatic mitral (valve) stenosis: Secondary | ICD-10-CM | POA: Diagnosis not present

## 2021-07-05 DIAGNOSIS — Z8616 Personal history of COVID-19: Secondary | ICD-10-CM | POA: Diagnosis not present

## 2021-07-07 DIAGNOSIS — I342 Nonrheumatic mitral (valve) stenosis: Secondary | ICD-10-CM | POA: Diagnosis not present

## 2021-07-07 DIAGNOSIS — I350 Nonrheumatic aortic (valve) stenosis: Secondary | ICD-10-CM | POA: Diagnosis not present

## 2021-07-07 DIAGNOSIS — K831 Obstruction of bile duct: Secondary | ICD-10-CM | POA: Diagnosis not present

## 2021-07-07 DIAGNOSIS — E440 Moderate protein-calorie malnutrition: Secondary | ICD-10-CM | POA: Diagnosis not present

## 2021-07-07 DIAGNOSIS — Z8616 Personal history of COVID-19: Secondary | ICD-10-CM | POA: Diagnosis not present

## 2021-07-07 DIAGNOSIS — R1310 Dysphagia, unspecified: Secondary | ICD-10-CM | POA: Diagnosis not present

## 2021-07-08 DIAGNOSIS — R1310 Dysphagia, unspecified: Secondary | ICD-10-CM | POA: Diagnosis not present

## 2021-07-08 DIAGNOSIS — E440 Moderate protein-calorie malnutrition: Secondary | ICD-10-CM | POA: Diagnosis not present

## 2021-07-08 DIAGNOSIS — I342 Nonrheumatic mitral (valve) stenosis: Secondary | ICD-10-CM | POA: Diagnosis not present

## 2021-07-08 DIAGNOSIS — Z8616 Personal history of COVID-19: Secondary | ICD-10-CM | POA: Diagnosis not present

## 2021-07-08 DIAGNOSIS — I350 Nonrheumatic aortic (valve) stenosis: Secondary | ICD-10-CM | POA: Diagnosis not present

## 2021-07-08 DIAGNOSIS — K831 Obstruction of bile duct: Secondary | ICD-10-CM | POA: Diagnosis not present

## 2021-07-09 DIAGNOSIS — I342 Nonrheumatic mitral (valve) stenosis: Secondary | ICD-10-CM | POA: Diagnosis not present

## 2021-07-09 DIAGNOSIS — Z8616 Personal history of COVID-19: Secondary | ICD-10-CM | POA: Diagnosis not present

## 2021-07-09 DIAGNOSIS — R1310 Dysphagia, unspecified: Secondary | ICD-10-CM | POA: Diagnosis not present

## 2021-07-09 DIAGNOSIS — I350 Nonrheumatic aortic (valve) stenosis: Secondary | ICD-10-CM | POA: Diagnosis not present

## 2021-07-09 DIAGNOSIS — K831 Obstruction of bile duct: Secondary | ICD-10-CM | POA: Diagnosis not present

## 2021-07-09 DIAGNOSIS — E440 Moderate protein-calorie malnutrition: Secondary | ICD-10-CM | POA: Diagnosis not present

## 2021-07-10 DIAGNOSIS — Z8616 Personal history of COVID-19: Secondary | ICD-10-CM | POA: Diagnosis not present

## 2021-07-10 DIAGNOSIS — K831 Obstruction of bile duct: Secondary | ICD-10-CM | POA: Diagnosis not present

## 2021-07-10 DIAGNOSIS — I350 Nonrheumatic aortic (valve) stenosis: Secondary | ICD-10-CM | POA: Diagnosis not present

## 2021-07-10 DIAGNOSIS — I342 Nonrheumatic mitral (valve) stenosis: Secondary | ICD-10-CM | POA: Diagnosis not present

## 2021-07-10 DIAGNOSIS — E440 Moderate protein-calorie malnutrition: Secondary | ICD-10-CM | POA: Diagnosis not present

## 2021-07-10 DIAGNOSIS — R1310 Dysphagia, unspecified: Secondary | ICD-10-CM | POA: Diagnosis not present

## 2021-07-11 DIAGNOSIS — I342 Nonrheumatic mitral (valve) stenosis: Secondary | ICD-10-CM | POA: Diagnosis not present

## 2021-07-11 DIAGNOSIS — Z8616 Personal history of COVID-19: Secondary | ICD-10-CM | POA: Diagnosis not present

## 2021-07-11 DIAGNOSIS — E440 Moderate protein-calorie malnutrition: Secondary | ICD-10-CM | POA: Diagnosis not present

## 2021-07-11 DIAGNOSIS — R1310 Dysphagia, unspecified: Secondary | ICD-10-CM | POA: Diagnosis not present

## 2021-07-11 DIAGNOSIS — K831 Obstruction of bile duct: Secondary | ICD-10-CM | POA: Diagnosis not present

## 2021-07-11 DIAGNOSIS — I350 Nonrheumatic aortic (valve) stenosis: Secondary | ICD-10-CM | POA: Diagnosis not present

## 2021-07-12 DIAGNOSIS — Z8616 Personal history of COVID-19: Secondary | ICD-10-CM | POA: Diagnosis not present

## 2021-07-12 DIAGNOSIS — R1310 Dysphagia, unspecified: Secondary | ICD-10-CM | POA: Diagnosis not present

## 2021-07-12 DIAGNOSIS — K831 Obstruction of bile duct: Secondary | ICD-10-CM | POA: Diagnosis not present

## 2021-07-12 DIAGNOSIS — I350 Nonrheumatic aortic (valve) stenosis: Secondary | ICD-10-CM | POA: Diagnosis not present

## 2021-07-12 DIAGNOSIS — I342 Nonrheumatic mitral (valve) stenosis: Secondary | ICD-10-CM | POA: Diagnosis not present

## 2021-07-12 DIAGNOSIS — E440 Moderate protein-calorie malnutrition: Secondary | ICD-10-CM | POA: Diagnosis not present

## 2021-07-25 ENCOUNTER — Telehealth: Payer: Self-pay

## 2021-07-25 NOTE — Telephone Encounter (Signed)
Pt granddaughter called in to cx the 5/26 appt.  Reporting that the pt was on Hospice and he past away 5/10.  FYI

## 2021-07-28 ENCOUNTER — Ambulatory Visit: Payer: Medicare Other | Admitting: Internal Medicine

## 2021-08-03 DEATH — deceased
